# Patient Record
Sex: Female | Born: 1985 | Race: White | Hispanic: No | Marital: Single | Smoking: Never smoker
Health system: Southern US, Community
[De-identification: ages and names within clinical notes are randomized; demographics above are authoritative.]

## PROBLEM LIST (undated history)

## (undated) DIAGNOSIS — K567 Ileus, unspecified: Secondary | ICD-10-CM

## (undated) DIAGNOSIS — K2100 Gastro-esophageal reflux disease with esophagitis, without bleeding: Secondary | ICD-10-CM

## (undated) DIAGNOSIS — G809 Cerebral palsy, unspecified: Secondary | ICD-10-CM

## (undated) DIAGNOSIS — M419 Scoliosis, unspecified: Secondary | ICD-10-CM

## (undated) DIAGNOSIS — T7840XA Allergy, unspecified, initial encounter: Secondary | ICD-10-CM

## (undated) DIAGNOSIS — F72 Severe intellectual disabilities: Secondary | ICD-10-CM

## (undated) DIAGNOSIS — R569 Unspecified convulsions: Secondary | ICD-10-CM

## (undated) DIAGNOSIS — G40909 Epilepsy, unspecified, not intractable, without status epilepticus: Secondary | ICD-10-CM

## (undated) HISTORY — DX: Severe intellectual disabilities: F72

## (undated) HISTORY — PX: OTHER SURGICAL HISTORY: SHX169

## (undated) HISTORY — PX: GASTROSTOMY W/ FEEDING TUBE: SUR642

## (undated) HISTORY — DX: Allergy, unspecified, initial encounter: T78.40XA

## (undated) HISTORY — DX: Unspecified convulsions: R56.9

## (undated) HISTORY — DX: Scoliosis, unspecified: M41.9

---

## 2004-03-30 ENCOUNTER — Ambulatory Visit (HOSPITAL_COMMUNITY): Admission: RE | Admit: 2004-03-30 | Discharge: 2004-03-30 | Payer: Self-pay | Admitting: Family Medicine

## 2005-02-06 ENCOUNTER — Ambulatory Visit: Payer: Self-pay | Admitting: Internal Medicine

## 2005-02-06 ENCOUNTER — Inpatient Hospital Stay (HOSPITAL_COMMUNITY): Admission: EM | Admit: 2005-02-06 | Discharge: 2005-02-09 | Payer: Self-pay | Admitting: Emergency Medicine

## 2006-03-17 ENCOUNTER — Emergency Department (HOSPITAL_COMMUNITY): Admission: EM | Admit: 2006-03-17 | Discharge: 2006-03-17 | Payer: Self-pay | Admitting: Emergency Medicine

## 2006-04-05 ENCOUNTER — Inpatient Hospital Stay (HOSPITAL_COMMUNITY): Admission: EM | Admit: 2006-04-05 | Discharge: 2006-04-10 | Payer: Self-pay | Admitting: Emergency Medicine

## 2006-06-14 ENCOUNTER — Inpatient Hospital Stay (HOSPITAL_COMMUNITY): Admission: EM | Admit: 2006-06-14 | Discharge: 2006-06-27 | Payer: Self-pay | Admitting: Emergency Medicine

## 2006-06-14 ENCOUNTER — Ambulatory Visit: Payer: Self-pay | Admitting: Internal Medicine

## 2006-06-20 ENCOUNTER — Ambulatory Visit: Payer: Self-pay | Admitting: Internal Medicine

## 2006-06-21 ENCOUNTER — Ambulatory Visit: Payer: Self-pay | Admitting: Emergency Medicine

## 2006-06-26 ENCOUNTER — Encounter: Payer: Self-pay | Admitting: Internal Medicine

## 2006-10-01 ENCOUNTER — Inpatient Hospital Stay (HOSPITAL_COMMUNITY): Admission: EM | Admit: 2006-10-01 | Discharge: 2006-10-11 | Payer: Self-pay | Admitting: Emergency Medicine

## 2006-10-03 ENCOUNTER — Ambulatory Visit: Payer: Self-pay | Admitting: Oncology

## 2008-03-04 ENCOUNTER — Emergency Department (HOSPITAL_COMMUNITY): Admission: EM | Admit: 2008-03-04 | Discharge: 2008-03-04 | Payer: Self-pay | Admitting: Emergency Medicine

## 2008-03-07 ENCOUNTER — Inpatient Hospital Stay (HOSPITAL_COMMUNITY): Admission: EM | Admit: 2008-03-07 | Discharge: 2008-03-15 | Payer: Self-pay | Admitting: Emergency Medicine

## 2008-08-26 ENCOUNTER — Emergency Department (HOSPITAL_COMMUNITY): Admission: EM | Admit: 2008-08-26 | Discharge: 2008-08-26 | Payer: Self-pay | Admitting: Emergency Medicine

## 2010-04-24 LAB — URINALYSIS, ROUTINE W REFLEX MICROSCOPIC
Nitrite: NEGATIVE
Specific Gravity, Urine: 1.01 (ref 1.005–1.030)
pH: 8.5 — ABNORMAL HIGH (ref 5.0–8.0)

## 2010-04-24 LAB — CBC
HCT: 36 % (ref 36.0–46.0)
Hemoglobin: 12.3 g/dL (ref 12.0–15.0)
WBC: 7.3 10*3/uL (ref 4.0–10.5)

## 2010-04-24 LAB — DIFFERENTIAL
Eosinophils Relative: 2 % (ref 0–5)
Lymphocytes Relative: 39 % (ref 12–46)
Lymphs Abs: 2.8 10*3/uL (ref 0.7–4.0)
Monocytes Absolute: 0.5 10*3/uL (ref 0.1–1.0)
Neutro Abs: 3.7 10*3/uL (ref 1.7–7.7)

## 2010-04-24 LAB — BASIC METABOLIC PANEL
Potassium: 3.6 mEq/L (ref 3.5–5.1)
Sodium: 132 mEq/L — ABNORMAL LOW (ref 135–145)

## 2010-05-04 LAB — COMPREHENSIVE METABOLIC PANEL
ALT: 12 U/L (ref 0–35)
Albumin: 2.6 g/dL — ABNORMAL LOW (ref 3.5–5.2)
Alkaline Phosphatase: 39 U/L (ref 39–117)
Calcium: 7.7 mg/dL — ABNORMAL LOW (ref 8.4–10.5)
Glucose, Bld: 122 mg/dL — ABNORMAL HIGH (ref 70–99)
Potassium: 3 mEq/L — ABNORMAL LOW (ref 3.5–5.1)
Sodium: 127 mEq/L — ABNORMAL LOW (ref 135–145)
Total Protein: 5.7 g/dL — ABNORMAL LOW (ref 6.0–8.3)

## 2010-05-04 LAB — BASIC METABOLIC PANEL
BUN: 1 mg/dL — ABNORMAL LOW (ref 6–23)
BUN: 4 mg/dL — ABNORMAL LOW (ref 6–23)
BUN: 6 mg/dL (ref 6–23)
CO2: 26 mEq/L (ref 19–32)
CO2: 27 mEq/L (ref 19–32)
CO2: 31 mEq/L (ref 19–32)
Calcium: 7.5 mg/dL — ABNORMAL LOW (ref 8.4–10.5)
Calcium: 8.1 mg/dL — ABNORMAL LOW (ref 8.4–10.5)
Calcium: 8.4 mg/dL (ref 8.4–10.5)
Calcium: 8.7 mg/dL (ref 8.4–10.5)
Chloride: 100 mEq/L (ref 96–112)
Chloride: 90 mEq/L — ABNORMAL LOW (ref 96–112)
Chloride: 92 mEq/L — ABNORMAL LOW (ref 96–112)
Creatinine, Ser: 0.29 mg/dL — ABNORMAL LOW (ref 0.4–1.2)
Creatinine, Ser: 0.31 mg/dL — ABNORMAL LOW (ref 0.4–1.2)
Creatinine, Ser: 0.31 mg/dL — ABNORMAL LOW (ref 0.4–1.2)
Creatinine, Ser: <0.3 mg/dL — ABNORMAL LOW (ref 0.4–1.2)
GFR calc Af Amer: >60 mL/min (ref >60)
GFR calc Af Amer: >60 mL/min (ref >60)
GFR calc Af Amer: >60 mL/min (ref >60)
GFR calc Af Amer: >60 mL/min (ref >60)
GFR calc non Af Amer: >60 mL/min (ref >60)
Glucose, Bld: 105 mg/dL — ABNORMAL HIGH (ref 70–99)
Glucose, Bld: 115 mg/dL — ABNORMAL HIGH (ref 70–99)
Potassium: 3.5 mEq/L (ref 3.5–5.1)
Potassium: 4.1 mEq/L (ref 3.5–5.1)
Potassium: 4.3 mEq/L (ref 3.5–5.1)
Sodium: 133 mEq/L — ABNORMAL LOW (ref 135–145)
Sodium: 136 mEq/L (ref 135–145)

## 2010-05-04 LAB — URINE CULTURE
Colony Count: NO GROWTH
Culture: NO GROWTH

## 2010-05-04 LAB — DIFFERENTIAL
Basophils Absolute: 0 10*3/uL (ref 0.0–0.1)
Basophils Absolute: 0 10*3/uL (ref 0.0–0.1)
Basophils Absolute: 0 10*3/uL (ref 0.0–0.1)
Basophils Absolute: 0 10*3/uL (ref 0.0–0.1)
Basophils Absolute: 0 10*3/uL (ref 0.0–0.1)
Basophils Relative: 0 % (ref 0–1)
Basophils Relative: 0 % (ref 0–1)
Basophils Relative: 0 % (ref 0–1)
Basophils Relative: 0 % (ref 0–1)
Basophils Relative: 0 % (ref 0–1)
Eosinophils Absolute: 0 10*3/uL (ref 0.0–0.7)
Eosinophils Absolute: 0 10*3/uL (ref 0.0–0.7)
Eosinophils Absolute: 0 10*3/uL (ref 0.0–0.7)
Eosinophils Relative: 0 % (ref 0–5)
Eosinophils Relative: 0 % (ref 0–5)
Eosinophils Relative: 0 % (ref 0–5)
Eosinophils Relative: 1 % (ref 0–5)
Lymphocytes Relative: 21 % (ref 12–46)
Lymphocytes Relative: 60 % — ABNORMAL HIGH (ref 12–46)
Lymphs Abs: 0.9 10*3/uL (ref 0.7–4.0)
Monocytes Absolute: 0.4 10*3/uL (ref 0.1–1.0)
Monocytes Absolute: 0.9 10*3/uL (ref 0.1–1.0)
Monocytes Relative: 10 % (ref 3–12)
Monocytes Relative: 8 % (ref 3–12)
Neutro Abs: 1.2 10*3/uL — ABNORMAL LOW (ref 1.7–7.7)
Neutro Abs: 2.7 10*3/uL (ref 1.7–7.7)
Neutro Abs: 3 10*3/uL (ref 1.7–7.7)
Neutro Abs: 3.8 10*3/uL (ref 1.7–7.7)
Neutro Abs: 5.1 10*3/uL (ref 1.7–7.7)
Neutrophils Relative %: 53 % (ref 43–77)
Neutrophils Relative %: 64 % (ref 43–77)
Neutrophils Relative %: 74 % (ref 43–77)

## 2010-05-04 LAB — CULTURE, BLOOD (ROUTINE X 2)

## 2010-05-04 LAB — CROSSMATCH: Antibody Screen: NEGATIVE

## 2010-05-04 LAB — URINALYSIS, ROUTINE W REFLEX MICROSCOPIC
Glucose, UA: NEGATIVE mg/dL
Hgb urine dipstick: NEGATIVE
Specific Gravity, Urine: <1.005 — ABNORMAL LOW (ref 1.005–1.030)
pH: 6.5 (ref 5.0–8.0)

## 2010-05-04 LAB — CBC
HCT: 24.5 % — ABNORMAL LOW (ref 36.0–46.0)
HCT: 33.8 % — ABNORMAL LOW (ref 36.0–46.0)
HCT: 35 % — ABNORMAL LOW (ref 36.0–46.0)
Hemoglobin: 10.1 g/dL — ABNORMAL LOW (ref 12.0–15.0)
Hemoglobin: 12.1 g/dL (ref 12.0–15.0)
Hemoglobin: 8.3 g/dL — ABNORMAL LOW (ref 12.0–15.0)
MCHC: 33.3 g/dL (ref 30.0–36.0)
MCHC: 33.7 g/dL (ref 30.0–36.0)
MCHC: 34.6 g/dL (ref 30.0–36.0)
MCHC: 34.7 g/dL (ref 30.0–36.0)
MCV: 81.7 fL (ref 78.0–100.0)
MCV: 82.1 fL (ref 78.0–100.0)
Platelets: 189 10*3/uL (ref 150–400)
Platelets: 84 10*3/uL — ABNORMAL LOW (ref 150–400)
Platelets: 94 10*3/uL — ABNORMAL LOW (ref 150–400)
RBC: 2.97 MIL/uL — ABNORMAL LOW (ref 3.87–5.11)
RBC: 4.14 MIL/uL (ref 3.87–5.11)
RBC: 4.18 MIL/uL (ref 3.87–5.11)
RDW: 13 % (ref 11.5–15.5)
RDW: 13.1 % (ref 11.5–15.5)
RDW: 13.2 % (ref 11.5–15.5)
RDW: 13.3 % (ref 11.5–15.5)
WBC: 4.1 10*3/uL (ref 4.0–10.5)

## 2010-05-04 LAB — CULTURE, RESPIRATORY W GRAM STAIN

## 2010-06-01 NOTE — Group Therapy Note (Signed)
NAMEANNELYSE, REY                 ACCOUNT NO.:  [id number]   MEDICAL RECORD NO.:  [id number]          PATIENT TYPE:  INP   LOCATION:  IC07                          FACILITY:  [facility]   PHYSICIAN:  Edward L. Juanetta Gosling, M.D.DATE OF BIRTH:  Oct 10, 1985   DATE OF PROCEDURE:  DATE OF DISCHARGE:                                 PROGRESS NOTE   The patient's doctor is Dr. [name].   Ms. Cagley seems to be doing better this morning.  She is awake and alert,  as alert as she gets anyway.  Pulse is in the 80's, blood pressure is  about 110.  Her chest is a little clearer.  Her heart is regular.  Her  hemoglobin level looks better today.  Thus far her hemolytic workup  looks negative by my interpretation.  As mentioned, her hemoglobin level  seems to be better.  Chest x-ray yesterday perhaps showed a pneumonia,  although clinically does not appear to be the typical situation.  I am  going to get another x-ray today, and she is set for MR of the head  today.   My plan then would be to see what we can do with weaning if she is okay.  I think we can put her on a trach collar today.  She did very well  yesterday on the CPAP plus with her having the tracheostomy it is easy  to put her back on if we need to.      Edward L. Juanetta Gosling, M.D.  Electronically Signed     ELH/MEDQ  D:  10/04/2006  T:  10/04/2006  Job:  [id number]

## 2010-06-01 NOTE — Group Therapy Note (Signed)
[name], [name]                 ACCOUNT NO.:  [id number]   MEDICAL RECORD NO.:  [id number]          PATIENT TYPE:  INP   LOCATION:  IC07                          FACILITY:  [facility]   PHYSICIAN:  Edward L. Juanetta Gosling, M.D.DATE OF BIRTH:  01/28/1985   DATE OF PROCEDURE:  10/09/2006  DATE OF DISCHARGE:                                 PROGRESS NOTE   [name] continues about the same.  She remains with tracheostomy, using  a trach collar that now has been in place for about 96 hours and seems  to be doing okay.  She still has some secretions, but they are not as  bad.  She is growing Pseudomonas in her sputum of course.  Her other  medical problems are unchanged.  She remains relatively hypotensive, and  she has evidence of significant congestion in her chest.  She has  significant mental retardation and cerebral palsy.  Her eyes are open.  She has been alert.   Lab work:  BMP essentially normal.  CBC shows white count 7300.  Hemoglobin is 10.5.  Retic count 3.7.   ASSESSMENT:  She has respiratory failure, multifactorial, possibly a  pneumonia.  She is growing Pseudomonas in her sputum.  It has been  difficult to assess as to whether she actually has pneumonia or has had  atelectasis.  Because of the fact that we cannot ventilate her with high  volumes because of her uncuffed trach tube.  That of course could be  changed out.  But she tolerated it well.  She has anemia which is of  unknown cause.  She has had an episode of Pseudomonas in her sputum.  Otherwise, she seems fairly stable.  Her chronic medical problems are  unchanged.  She seems to be doing fairly well.  Plan is per Dr. Lubertha South, who apparently plans to potentially let her go home with her  home health fairly soon.      Edward L. Juanetta Gosling, M.D.  Electronically Signed     ELH/MEDQ  D:  10/10/2006  T:  10/10/2006  Job:  [id number]

## 2010-06-01 NOTE — Group Therapy Note (Signed)
NAMELUTRICIA, WIDJAJA                 ACCOUNT NO.:  [id number]   MEDICAL RECORD NO.:  [id number]          PATIENT TYPE:  INP   LOCATION:  IC07                          FACILITY:  [facility]   PHYSICIAN:  Edward L. Juanetta Gosling, M.D.DATE OF BIRTH:  Aug 15, 1985   DATE OF PROCEDURE:  DATE OF DISCHARGE:                                 PROGRESS NOTE   Ms. Vasudevan seems to be very stable.  She has been on the trache collar for  about 24 hours now and has tolerated that well.  Her secretions are much  thinner and overall she looks better.   EXAM:  Shows that she opens her eyes.  Her chest shows minimal if any rhonchi.  Her blood pressures in the high 80s, which is typical for her.  Her  heart rate is in the 80s.   Her lab work shows that her white count 6700, hemoglobin 9.7 about the  same.  Platelets 83,000 reticulocyte count 2.4 BMET normal, glucose is  176.   ASSESSMENT:  She is much improved.   PLAN:  She is going to continue on her antibiotics for the Pseudomonas  in her sputum, but overall she seems much better.  I will be gone after  this morning until the morning of September 22.  If she is still in the  hospital I will see her then.      Edward L. Juanetta Gosling, M.D.  Electronically Signed     ELH/MEDQ  D:  10/06/2006  T:  10/06/2006  Job:  [id number]

## 2010-06-01 NOTE — Group Therapy Note (Signed)
[name], [name]                 ACCOUNT NO.:  [id number]   MEDICAL RECORD NO.:  [id number]          PATIENT TYPE:  INP   LOCATION:  IC07                          FACILITY:  [facility]   PHYSICIAN:  Edward L. Juanetta Gosling, M.D.DATE OF BIRTH:  [birth year]   DATE OF PROCEDURE:  10/10/2006  DATE OF DISCHARGE:                                 PROGRESS NOTE   [name] is overall about the same.  She has no new problems noted  through the night.  Her white count is 10,300.  Hemoglobin is 10.6.  Platelets 226.  Retic 2.8.  BMP normal.   ASSESSMENT:  She has had a remarkable improvement in her platelet count  and anemia, etc.  Her chest is clear now.  She still needs treatment for  Pseudomonas.  We will plan to sign off at this point.   Thank you for allowing me to see her with you.  I will be glad to see  her at any point at your request.      Ramon Dredge L. Juanetta Gosling, M.D.  Electronically Signed     ELH/MEDQ  D:  10/10/2006  T:  10/10/2006  Job:  [id number]

## 2010-06-01 NOTE — H&P (Signed)
[name], [name]                 ACCOUNT NO.:  [id number]   MEDICAL RECORD NO.:  [id number]          PATIENT TYPE:  INP   LOCATION:  IC06                          FACILITY:  [facility]   PHYSICIAN:  Scott A. Gerda Diss, MD    DATE OF BIRTH:  11-19-1985   DATE OF ADMISSION:  06/14/2006  DATE OF DISCHARGE:  LH                              HISTORY & PHYSICAL   CHIEF COMPLAINT:  Vomiting, fever.   HISTORY OF PRESENT ILLNESS:  This is a 25 year old female with history  of severe cerebral palsy, scoliosis, seizure disorder, who presents with  a 1 day history of fever, multiple episodes of vomiting last night and  early this morning.  No diarrhea, has been running some fever.  Does not  appear to be in any pain.  Family has not noted any type of respiratory  difficulty.   PAST MEDICAL HISTORY:  1. Cerebral palsy since birth.  2. Scoliosis.  3. Reflux.  4. Has a G-tube for feedings.  5. Tracheostomy with a tracheotomy tube.  6. Seizure disorder.  7. Dyspnea with a history of sleep apnea.  8. Contractions and spasms.  9. History of severe mental retardation.  10.Chronic allergies.  11.History of pneumonia.  12.History of slight aspirations.  13.History interstitial nephritis.  14.History of chronic constipation.   PAST SURGICAL HISTORY:  1. Tracheostomy in 2000 and G-tube in 2000.  2. Multiple hospitalizations for pneumonia.   FAMILY HISTORY:  Hypertension, diabetes.   SOCIAL HISTORY:  Single, does not smoke or drink, is bedridden.   ALLERGIES:  FORTAZ GIVES HIVES, ALSO REPORTED MORPHINE ALLERGY.   MEDICATIONS:  1. Depakote 250 mg/5 mL, 15 mL q.h.s. and 10 mL twice daily.  2. Tegretol 100 mg tabs 2 per PEG b.i.d., 3 q.h.s. per PEG.  3. Prilosec 20 mg daily.  4. Baclofen 20 mg per 4 mL (5 mg per mL) take 4 mL per PEG q.i.d.  5. GlycoLax 1/2 scoop daily in water and then feedings per PEG.   It should be noted that the patient was recently in the hospital with a  partial small bowel  obstruction, was observed for a couple of days and  went away on its own.  CT scan was done, and no surgical etiology was  found for this.   REVIEW OF SYSTEMS:  See for above.   LABORATORY DATA:  White count 14.2, hemoglobin 15.3, neutrophils 90%,  lymphocytes 5%, sodium 135, potassium 2.6, chloride 92, bicarb 30,  glucose 129, BUN 27, creatinine 0.64.  Liver enzymes normal.  Urine  shows greater than 1.030 with moderate bilirubin, total protein 100,  nitrites positive, 7-10 WBC, many bacteria.  Urine culture a.m. and  blood culture was sent.   PHYSICAL EXAMINATION:  LUNGS:  Clear.  HEART:  Regular.  Pulse normal.  It should be noted that blood pressure  looks good.  VITAL SIGNS:  Temperature 100.0 rectally.  ABDOMEN:  Soft, no guarding or rebound.  It should also be noted that  the x-ray shows partial small bowel obstruction.  LUNGS:  Look good.   ASSESSMENT/PLAN:  1. Febrile illness - I feel this is secondary to urinary tract      infection, possibly pyelonephritis.  Treatment:  Cipro IV.  Wait      culture of the urine and culture of blood work.  2. Partial small bowel obstruction, consult surgery for consultation,      but it went away on its on last time.  Hopefully, it will do the      same this time.  3. Seizure disorder.  Go with the medications but cannot feed per PEG      just yet.  We will also check Tegretol and Depakote levels and      monitor the patient accordingly.      Scott A. Gerda Diss, MD  Electronically Signed     SAL/MEDQ  D:  06/14/2006  T:  06/14/2006  Job:  [id number]

## 2010-06-01 NOTE — Consult Note (Signed)
NAMEMARICE, [name]                 ACCOUNT NO.:  [id number]   MEDICAL RECORD NO.:  [id number]          PATIENT TYPE:  INP   LOCATION:  IC07                          FACILITY:  [facility]   PHYSICIAN:  Edward L. Juanetta Gosling, M.D.DATE OF BIRTH:  Aug 29, 1985   DATE OF CONSULTATION:  DATE OF DISCHARGE:                                 CONSULTATION   REASON FOR CONSULTATION:  The patient is on respirator.   HISTORY:  [name] is a 25 year old who has multiple medical problems  including cerebral palsy, seizure disorder, tracheostomy which is  permanent, trach tube which is permanent and mental retardation.  She  lives at home with her mother and has been brought to the emergency room  because of fever and seizures.  She has had two seizures.  She was  febrile.  She was brought to the emergency room and she was also  coughing up some thick sputum as well.   PAST MEDICAL HISTORY:  As mentioned is positive for cerebral palsy,  seizure disorder, mental retardation, bowel obstruction, chronic  difficulty with secretions requiring permanent tracheostomy, PEG tube  placement.   SOCIAL HISTORY:  She lives at home with her mother.  Has a permanent  trach and PEG tube and is unable to provide any of her own care.   REVIEW OF SYSTEMS:  Is essentially negative, otherwise no other history.   MEDICATIONS:  Are supposed to be Depacon 750 mg in the morning, 1000 mg  at 2:00 p.m. and 8 p.m., Tegretol 100 mg two tablets twice a day, three  tablets at bedtime, Reglan 5 mg four times a day, Zyrtec 10 mg daily,  baclofen 20 mg four times day, Prilosec 20 mg b.i.d., Robinul 1 mg 3  times a day, GlycoLax daily, simethicone as-needed, Ativan 1 mg p.r.n.   EXAM:  Shows that she is in the bed essentially unresponsive with the  tracheostomy and she is on the ventilator, but this is a non-cuffed  tracheostomy tube.  So it is difficult to say, to the full ventilation  effect she is getting.  Her temperature was 99.9 on  admission.  Blood  pressure in the 110 range, pulse about 80.  She had been in the 160s and  170s.  Her nose and throat are clear.  She does have some reaction to  light in her pupils.  She has what appears to be some thrush __________  she does have a tracheostomy.  She does __________ .  Her chest shows  some rhonchi bilaterally.  Her heart is regular without gallop.  Her  abdomen bowel sounds are present.  Peg tube is in place.  She has  contractures in her upper and lower extremities.  Noncommunicative.  On  admission white blood cell count 13,000, hemoglobin 13, hematocrit of  40, platelets 103, blood gas on 6 liters pH 7.4, pCO2 of 41, pO2 of 66.  Sodium 141, potassium 2.9, chloride 98, bicarb 30, glucose 84, BUN 17,  creatinine 0.4.  Her urine shows nitrite negative, but 7-10 white cells,  0-2 red cells and some bacteria.  This morning, magnesium 2.5,  phosphorus 5.1, slightly high.  Blood gas on 100% tidal volume of 250  assist control rate of 20, pH 7.38, pCO2 of 37.9, pO2 of 297.  Blood  culture so far is negative.  X-ray shows right basilar atelectasis.  No  definite pneumonia.   ASSESSMENT:  She has increased from seizures.  She is having increased  respiratory difficulty and has tachycardia.  Her routine urine was  pretty negative but she did have white cells and bacteria seen in the  cath  specimen so she may well have a urinary tract infection.  If she had  more seizures she certainly would be at risk for aspiration cause her  tube is non-cuffed.  But I do not see any definite evidence that she has  that.  Respiratory culture is pending.  Blood cultures negative so far.  She is on 100% O2.  Her pO2 is 297 so we can certainly drop her FIO2.      Edward L. Juanetta Gosling, M.D.  Electronically Signed     ELH/MEDQ  D:  10/02/2006  T:  10/02/2006  Job:  [id number]

## 2010-06-01 NOTE — Group Therapy Note (Signed)
[name], [name]                 ACCOUNT NO.:  [id number]   MEDICAL RECORD NO.:  [id number]           PATIENT TYPE:  INP   LOCATION:  IC06                          FACILITY:  [facility]   PHYSICIAN:  [name], M.D.DATE OF BIRTH:  June 26, 1985   DATE OF PROCEDURE:  06/16/2006  DATE OF DISCHARGE:                                 PROGRESS NOTE   SUBJECTIVE:  The patient has had no further vomiting.  Her bowels are  moving.  Mom is concerned her urine output might not be quite as good.  No excessive fussiness noted.   PHYSICAL EXAMINATION:  VITAL SIGNS:  Stable, afebrile.  BP 110/70, O2  saturation is good.  LUNGS:  Baseline junkiness.  No reactive airway.  No tachypnea.  HEART:  Normal rate.  No significant murmurs.  ABDOMEN:  Soft, good bowel sounds.   Urine culture returned no growth so far.  Tracheal culture pending.  See  CBC and MET-7 results.   IMPRESSION:  1. Partial small bowel obstruction, clinically improving, though not      back to baseline yet.  2. IV access difficulties.  A 24-gauge peripheral IV restarted today,      failed.  We are going to go ahead and place a PICC line because the      patient may need to be here for several more days.  Discussed with      family.  Advised also with uncertain urine output, we will press on      with the Foley.  3. Tracheitis.  4. Question urinary tract infection.  Culture is negative, not sure if      this was done after the first round of antibiotics.  With the      tracheitis, we will go ahead and maintain Cipro.  Awaiting      peritracheal cultures.  5. Seizure disorder.  6. Feeding concerns.  Will maintain same feeding for now.   PLAN:  As per orders and as per Dr. Lovell Sheehan.  Expect discharge 48-72  hours.  May move from ICU to regular room tomorrow.  Further orders as  noted on the chart.      [name], M.D.  Electronically Signed     WSL/MEDQ  D:  06/16/2006  T:  06/17/2006  Job:  [id number]

## 2010-06-01 NOTE — Discharge Summary (Signed)
NAMEADAN, BEAL                 ACCOUNT NO.:  [id number]   MEDICAL RECORD NO.:  [id number]          PATIENT TYPE:  INP   LOCATION:  2107                         FACILITY:  [facility]   PHYSICIAN:  Zenia Resides, NP      DATE OF BIRTH:  1985/07/22   DATE OF ADMISSION:  06/21/2006  DATE OF DISCHARGE:                               DISCHARGE SUMMARY   DISCHARGE DIAGNOSES:  1. Status post acute respiratory failure/ventilator dependent      respiratory failure secondary to left lower lobe pneumonia versus      Pseudomonas tracheal bronchitis, and altered mental status.  2. Seizures.  3. History of cerebral palsy with significant debilitation.   BRIEF HISTORY:  The patient is a 25 year old female with a history of  severe cerebral palsy, scoliosis, seizure disorder who presented to  [facility] with a 1-day history of fever and multiple episodes  of vomiting. She was diagnosed with a partial small bowel obstruction as  well as Pseudomonal tracheal bronchitis. She was treated conservatively  for ileus, started on broad-spectrum antibiotics, and then later had  antibiotic coverage narrowed based on sensitivities. She developed  progressive hypotension on 06/17/06, as well as acute respiratory  distress felt to be secondary to mucous plugging. A chest x-ray  demonstrated left lower lobe atelectasis versus infiltrate in the  setting of known Pseudomonas sputum cultures.   Apparently there was some difficulty in changing the patient over from a  cuffless to a cuffed trach. At [facility] she was actually  ventilated on a cuffless trach during transfer. She came to [facility]. On full ventilator support she was changed to a cuffed trach.  Antibiotic coverage was continued to complete 10 days of complete  antibiotic therapy. She was on gentamicin from 5/131/08 to 06/22/06, and  then continued Aztreonam from 06/22/06 to 06/27/06. Upon the time of  discharge her sputum is clear  but continues to be copious at times. She  has no new fever, chills and chest x-ray is cleared. She has been  liberated from mechanical ventilation and upon time of discharge is back  to maintenance supplemental oxygen support.   Ileus diagnosed at [facility], resolved. Will be discharged to  home back on home medication regimen.   Seizure disorder. She did have a witnessed seizure while at [facility] which resulted in the patient's  being placed on Dilantin. Dr.  Jolayne Panther' office was notified. This is the pediatric neurologist who  had followed the patient for quite some time prior to today. She has not  seen an adult neurologist yet at this point, but recommendations were  made to increase her Depakote dosing as well as her Tegretol dosing.  Upon time of discharge she will be changed to 1000 mg 3 times a day of  Depakote and her dosing of Tegretol will be changed from 200 mg twice a  day and 300 mg once a day to 200 mg once a day and 300 mg twice a day.   Cerebral palsy and severe debilitation. No  acute changes have been noted  during this hospitalization. These issues are stable and chronic. She  will be discharged to home where her mother has been instructed to  resume all previous home health care instructions including humidified  oxygen via trach collar with 2 liters blood in at night. Routine  tracheostomy care as indicated. Fiber __________ as previously noted and  complete home health care nursing.   LABORATORY DATA:  Upon time of discharge on 06/27/06: Sodium 136,  potassium 3.9, chloride 103, bicarbonate 28, glucose 110, BUN 5,  creatinine less than 0.3. Hemoglobin 9.7, hematocrit 28.8, white blood  cell count 9.4, platelet count 245.   Microbiology sputum culture demonstrating moderate Pseudomonas  aeruginosa obtained on 06/15/06, pan sensitive.   On 06/17/06, blood cultures x2, no growth to date.   Drug levels: Valproic acid currently 46.1,  Tegretol at 7.2.   Radiology: Chest x-ray demonstrating improved left lower lobe  atelectasis.   PHYSICAL EXAMINATION:  Upon time of discharge physical exam revealed a T  max of 99.4, heart rate 107 to 113, blood pressure of 102/85,  respirations 26, saturations 97% on 40% trach collar. In general, a  debilitated, contracted, young, small female with slight tachypnea.  HEENT, she has mild yellow-tinged secretions from tracheostomy site.  Lungs demonstrating low volumes. No wheeze, decreased at bases. Cardiac,  regular rate and rhythm and abdomen somewhat distended. Tolerating tube  feeds currently. Extremities are without edema. Neurologically moving  spontaneously but not following commands.   DISPOSITION:  Currently cleared from a critical care standpoint to  resume previous total care as outlined by her prior primary care  Nicolet Griffy under the direction of the patient's  mother and home health  nursing staff from [place].   DISCHARGE INSTRUCTIONS:  As follows  1. Diet. She is to resume Ensure Plus 8 ounces every 6 hours, followed      by 120 mL of water flush. Additionally, she was instructed to flush      feeding tube after medications.  2. Followup. She has follow up with a resident neurology clinic at      [facility] on July 28th. Also follow up with Dr. Lubertha South 07/06/06,      at 2:20.  3. Discharge medications      a.     Depakote 1000 mg three times a day.      b.     Tegretol 200 mg in the morning, followed by 300 mg x2 more       doses on a daily basis.      c.     Prilosec 20 mg daily.      d.     Baclofen 20 mg 4 times a day.      e.     GlycoLax half a scoop daily with water.  4. Activity. The mother was instructed to resume all previous activity      including prior tracheostomy care, prior fiber __________ , prior      pulse oximetry, prior activity as outlined by her previous      providers.   Upon time of discharge the patient appears to have met maximum  benefit from inpatient stay. She will be discharged to home under the care of  her mother and home health nursing staff will follow up as previously  outlined.      Zenia Resides, NP     PB/MEDQ  D:  06/27/2006  T:  06/27/2006  Job:  [id number]  cc:   Monia Sabal, Dr.  [name], M.D.

## 2010-06-01 NOTE — H&P (Signed)
NAMECARMELA, [name]                 ACCOUNT NO.:  [id number]   MEDICAL RECORD NO.:  [id number]          PATIENT TYPE:  INP   LOCATION:  IC07                          FACILITY:  [facility]   PHYSICIAN:  Marcello Moores, MD   DATE OF BIRTH:  May 30, 1985   DATE OF ADMISSION:  10/01/2006  DATE OF DISCHARGE:  LH                              HISTORY & PHYSICAL   PRIMARY MEDICAL DOCTOR:  Dr. Simone Curia.   NEUROLOGIST:  At [facility].   CHIEF COMPLAINT:  Two episodes of seizure and fever in the afternoon.   HISTORY OF PRESENT ILLNESS:  [name] is a 25 year old female patient  with multiple medical problems including cerebral palsy since childhood,  history of chronic seizure disorder, on medication, permanent  tracheostomy and permanent PEG placement and with mental retardation.  She lives with her mom and brought here today to the emergency room with  the above complaint.  The mother stated that she had 2 episodes of  seizure which lasted for several minutes and then she noticed that she  was febrile also in 99 degrees Fahrenheit and she decided to bring her  to the emergency room and she was coughing and with thick sputum as  well.  Otherwise, the mother stated that other than these episodes of  seizure, she was in her usual state and she did not notice any new  change, other than this episode of seizure and low-grade fever.   REVIEW OF SYSTEMS:  Ten-point review of system is very limited because  of the above-mentioned medical problems.  The patient is not  communicative and the only history extracted was from her mother, who is  her caregiver.   ALLERGIES:  1. CEFTAZIDIME.  2. FORTAZ.  3. MORPHINE.   SOCIAL HISTORY:  The patient is sick and dependent and lives with her  mother and she is on chronic permanent tracheostomy and permanent PEG  for feeding.   PAST MEDICAL HISTORY:  1. Cerebral palsy.  2. Seizure disorder.  3. Mental retardation.  4. History of bowel  obstruction.  5. Chronic respiratory failure, status post permanent tracheostomy.  6. Status post PEG placement.   HOME MEDICATIONS:  1. Depacon 750 mg per 15 mL every morning.  2. Depacon 1000 mg per 20 mL twice a day at 2 p.m. and 8 p.m.  3. Tegretol 100 mg two tablets twice a day.  4. Tegretol 100 mg three tablets at bedtime.  5. metoclopramide5 mg per 5 mL four times a day.  6. Zyrtec 10 mg per 10 mL once a day.  7. Baclofen 20 mg four times a day.  8. Prilosec 20 mg twice a day.  9. Robinul 1 mg per 2.5 mL three times a day.  10.GlycoLax one cup daily.  11.SSimethicone0 mg.  12.Diastat 5 mg p.r.n.  13.Ativan 1 mg p.r.n.   PHYSICAL EXAMINATION:  GENERAL:  The patient is lying on the bed on  tracheostomy and on oxygen supply.  VITAL SIGNS: Temperature is 99.9, blood pressure is 109/71 and pulse is  129, respiratory rate is 22-24 per  minute and saturation is currently  100% on 6 L of oxygen via the tracheostomy.  HEENT:  She has pink conjunctivae and anicteric sclerae.  Pupils are  equal and reactive slowly.  She has buccal mucosa scanty thrush.  NECK:  She is on tracheostomy.  CHEST:  She has air entry bilaterally with some scattered crackles on  both sides.  Otherwise, there are no wheezes or rhonchi.  CVS: S1 and S2 well-heard, tachycardiac.  ABDOMEN:  Flat.  PEG tube is in place and clear, well-functioning.  EXTREMITIES:  She has contractures on both upper and lower extremities,  but there is not any edema.  CNS:  She is noncommunicative, as mentioned above, and with complication  of cerebral palsy with contractures and difficult to appreciate whether  there is any neurological deficit or not.   LABORATORY DATA:  White blood cell count is 13,000, hemoglobin is 13 and  hematocrit is 40; platelet count is 103,000.  ABG was done on 6 L; pH  was 7.4, pCO2 was 41 and pO2 was 66, bicarb 29 and saturation was 92%.  On the chemistry, sodium is 141 and potassium is 2.9, chloride  is 98,  bicarb is 30, glucose is 84, BUN 17, creatinine is 0.4, calcium is  9.3.Carbamazepinene level is less than 2 and the valproic acid level is  175, above the therapeutic level.   ASSESSMENT:  1. Seizure episode.  The patient has seizure disorder for which she is      on Depakote and carbamazepine and she did not take the evening dose      of carbamazepine and the level is less than the therapeutic level      and will give her her bedtime dose.  She did not have any episode      of seizure since she is in the emergency room and will continue her      medications as before and will monitor for any episode of seizure      and will admit her to intensive care unit for close monitoring and      we will do MRI of the brain tomorrow morning and will consult a      neurologist also for further management.  The patient has an      appointment on Tuesday at [facility] with neurologist.  2. Aspiration pneumonia.  The patient presented with fever after the      seizure and she has episodes of cough with thick sputum and I will      put her on Levaquin and clindamycin intravenously, even though the      chest x-ray did not show anything new except an elevated right      hemidiaphragm and with atelectasis and old scar.  3. Hypokalemia and we will replace it to with intravenous potassium      chloride and will repeat the potassium level.  We will put her on      oxygen via the tracheostomy and to keep her saturation more than      94% and will      watch for seizure and aspiration and will continue her home      medication as it was and will put her on deep venous thrombosis as      well as GI prophylaxis and we will monitor her CBC and Chem-3 and      will send urinalysis as well as blood culture.  She will be  followed by Dr. Lubertha [name].      Marcello Moores, MD  Electronically Signed     MT/MEDQ  D:  10/02/2006  T:  10/02/2006  Job:  [id number]

## 2010-06-01 NOTE — Consult Note (Signed)
[name], [name]                 ACCOUNT NO.:  [id number]   MEDICAL RECORD NO.:  [id number]          PATIENT TYPE:  INP   LOCATION:  IC06                          FACILITY:  [facility]   PHYSICIAN:  R. Roetta Sessions, M.D. DATE OF BIRTH:  1985/11/09   DATE OF CONSULTATION:  DATE OF DISCHARGE:                                 CONSULTATION   REQUESTING PHYSICIAN:  Scott A. Gerda Diss, MD   REASON FOR CONSULTATION:  Hemoccult positive stool/ileus.   HISTORY OF PRESENT ILLNESS:  [name] is a 25 year old Caucasian female  with a complicated past medical history.  She has severe cerebral palsy,  scoliosis, seizure disorder, G-tube, and tracheotomy.  Her mother states  she has history of recurrent ileus. She was hospitalized from March 19  to March 24 of this year followed by Dr. Lovell Sheehan. She has history of  similar problems approximately 2 years ago and was seen at [facility].  A week ago she began vomiting, she  vomited 4 times that night and the next morning her mother brought her  to the emergency room.  She was diagnosed with a urinary tract infection  and partial small-bowel obstruction/ileus. She has had heavy menses  since she has been admitted.  There are no complaints of abdominal pain.  Her mom says she __________ 2 days ago, she is having 1 to 3 __________  or melena.  There has been no reported hematemesis.  Her hemoglobin  dropped to 8.4 on Jun 17, 2006.  She is status post 2 units transfusion  of packed red blood cells.  Hemoglobin is up to 11 today. She had a non  contrast CT on April 05, 2006 which showed a distal small bowel  obstruction and pneumoperitoneum.  She also has plain films June 19, 2006  which showed gaseous distention of both large and small bowel consistent  with ileus.  She has had 1 hemoccult positive stool yesterday by Dr.  Gerda Diss and 1 negative this morning.   PAST MEDICAL AND SURGERY HISTORY:  Severe cerebral palsy,  scoliosis,  seizure disorder, chronic GERD, G-tube was placed in 2000 by Dr. Alphonzo Grieve  at [facility]. She was tolerating the  feeding until recent admission.  She had tracheostomy in 2000, she has  history of dyspnea, sleep apnea, severe mental retardation, chronic  allergies,  history of recurrent aspiration pneumonia, interstitial  nephritis, chronic constipation, ileus as described in HPI.   MEDICATIONS PRIOR TO ADMISSION:  1. Depakote 250 mg per 5 ml at 15 ml every night and 10 ml b.i.d.  2. Tegretol 200 mg per PEG b.i.d. and 300 mg every night.  3. Prilosec 20 mg daily.  4. Baclofen 20 mg/4 ml, 4 mL q.i.d.  5. GlycoLax one-half scoop in water daily.   ALLERGIES:  FORTAZ, __________.   FAMILY HISTORY:  There is no family history of IBD or __________.  Her  father is deceased at 88 secondary to complications from diabetes  mellitus, renal failure and coronary disease.  Mother has history of  hypertension  and anemia.   SOCIAL HISTORY:  Ms. Ohalloran lives with her mother.  She has 2 healthy  older sisters.  There is no history of tobacco, alcohol or drug use.   REVIEW OF SYSTEMS:  __________ fever and chills.  __________ began 5  days ago.  She has history of nausea.  Otherwise negative review of  systems.   PHYSICAL EXAMINATION:  VITAL SIGNS:  Temperature 101.5, pulse 102.  Respirations 24.  Blood pressure 91/30.  GENERAL:  Pleasant, __________ Caucasian female who is alert.  She  responds to verbal, tactile stimulus with smiling.  Her mother is at her  bedside  Her sclerae are clear, nonicteric.  Conjunctivae pink.  Oropharynx is  pink and moist without any lesions.  She has a trachea intact with white  discharge at the site.  HEART: Regular rate and rhythm , normal S1, S2.  LUNGS:  Rhonchi.  ABDOMEN:  Has decreased bowel sounds x4.  Soft, non-tender,  nondistended.  Without hepatosplenomegaly or mass.  She has a Foley to  straight drainage, bag  with clear yellow urine.  EXTREMITIES:  Contractures bilaterally.   LABORATORY STUDIES:  WBC 8.1, hemoglobin 11, hematocrit 31.7, platelets  162.  Calcium 8.2, sodium 136, potassium 4, chloride 105, CO2 of 25, BUN  4, creatinine 0.3, glucose 110, total bilirubin 0.6, alkaline  phosphatase 46, AST 15, ALT 10, total protein 6.3 and albumin 2.9.   IMPRESSION:  [name] is an unfortunate 25 year old __________  severe cerebral palsy, ileus, seizure disorder, G-tube, and trach with  recurrent emesis and history of ileus.  On June 19, 2006, __________  persistent ileus, she was found to have hemoccult positive stool by Dr.  Gerda Diss, negative this morning.  She has required transfusion when her  hemoglobin dropped to 8.4. There is no evidence of active  gastrointestinal bleeding. She has received 2 units of packed RBC's and  hemoglobin is at 11. She is menstruating heavily, per mother.  She is on  dopamine for hypertension although she normally is somewhat hypertensive  at her baseline per Dr. Gerda Diss.   She is at risk for peptic ulcer disease, __________ obstruction and  ileus.  This case has been discussed with Dr. __________ and our plan of  care is outlined below.   PLAN:  1. EGD once patient off dopamine and stable enough.  2. Agree with proton pump inhibitor.  3. Transfusion p.r.n. to keep H and H stable.  4. Once her EGD is completed and vomiting has resolved would consider      contrast CT __________ evaluation of possible ileus.  5. Will follow closely.  We would like to thank Dr. Gerda Diss for the      opportunity to take care of Ms. Larita Fife.      Nicholas Lose, N.P.      Jonathon Bellows, M.D.  Electronically Signed    KC/MEDQ  D:  06/20/2006  T:  06/20/2006  Job:  [id number]   cc:   Lorin Picket A. Gerda Diss, MD  Fax: [phone]

## 2010-06-01 NOTE — Consult Note (Signed)
[name], [name]                 ACCOUNT NO.:  [id number]   MEDICAL RECORD NO.:  [id number]          PATIENT TYPE:  INP   LOCATION:  IC06                          FACILITY:  [facility]   PHYSICIAN:  Edward L. Juanetta Gosling, M.D.DATE OF BIRTH:  11/04/1985   DATE OF CONSULTATION:  DATE OF DISCHARGE:                                 CONSULTATION   HISTORY OF PRESENT ILLNESS:  Ms. Resch is a 25 year old with a history of  cerebral palsy, scoliosis, and seizure disorder.  She came into the  hospital with what appears to have been probably a pneumonia.   PAST MEDICAL HISTORY:  She has a history of cerebral palsy, scoliosis,  and reflux.  She has a G-tube.  She has a tracheostomy.  She has seizure  disorder, history of sleep apnea, history of multiple contractures,  mental retardation, chronic allergies, history of pneumonia,  interstitial nephritis, and chronic constipation.   PAST SURGICAL HISTORY:  She has had a trache in 2000, G-tube in 2000.   HOSPITALIZATIONS:  She had been hospitalized on multiple occasions for  pneumonia.   FAMILY HISTORY:  Positive for hypertension and diabetes.   SOCIAL HISTORY:  She is single.  She is bedridden.   MEDICATIONS AT HOME:  1. Depakote 250 mg/5 mL 15 mL at bedtime, 10 mL twice a day.  2. Tegretol 100 mg 2 per percutaneous endoscopic gastrostomy tube two      times a day, three at bedtime.  3. Prilosec 20 mg daily.  4. Baclofen 20 mg/4 mL 4 mL four times a day by PEG tube.  5. Glycolax.   REVIEW OF SYSTEMS:  Positive for bowel obstruction.   PHYSICAL EXAMINATION:  GENERAL:  Her eyes are open.  She is not  terrifically responsive.  VITAL SIGNS:  Blood pressure 101/40, pulse 102, respirations 25, oxygen  sats 95%, so she is being oxygenated fairly well, but she is losing  almost all of her ventilator breath because of the end-cuff tracheostomy  tube.  CHEST:  Some rhonchi bilaterally.  HEART:  Regular.  ABDOMEN:  Soft.  EXTREMITIES:  No edema.   IMAGING STUDIES:  Her x-ray of the abdomen done on June 2 is consistent  with ileus.  Chest x-ray from today:  Left lower lobe atelectasis,  consolidation; improved right basilar atelectasis; continued colonic  ileus.   LABORATORY DATA:  BMED today shows BUN 3, creatinine 0.37.  CBC done  yesterday:  White count 8100, hemoglobin 11.  Blood cultures from May  31:  Both with no growth.   ASSESSMENT:  She has developed respiratory failure.  She is now on a  ventilator.  She has ileus.  She has a seizure disorder, and we are not  able to ventilate her very well.  Her oxygenation appears to be okay.  I  will get a breath test and see what we can do to help with ventilation.  I have discussed her situation with Dr. Jearld Fenton of [facility],  and he feels that they cannot do a great deal to help.      Ramon Dredge  Patrice Paradise, M.D.  Electronically Signed     ELH/MEDQ  D:  06/21/2006  T:  06/21/2006  Job:  [id number]

## 2010-06-01 NOTE — H&P (Signed)
[name], [name]                 ACCOUNT NO.:  [id number]   MEDICAL RECORD NO.:  [id number]          PATIENT TYPE:  INP   LOCATION:  IC04                          FACILITY:  [facility]   PHYSICIAN:  [name], M.D.DATE OF BIRTH:  06-02-1985   DATE OF ADMISSION:  03/07/2008  DATE OF DISCHARGE:  LH                              HISTORY & PHYSICAL   CHIEF COMPLAINT:  Cough, fever, wheezing.   SUBJECTIVE:  This patient is a 25 year old Caucasian female with a  history of cerebral palsy since birth, reflux, chronic trache, seizure  disorder, chronic dyspnea, chronic contractions problem, interstitial  nephritis, chronic constipation, and history of prior pneumonia who  presents to emergency room on the day of admission.  The patient was  seen in the emergency room several days prior.  X-ray at that time  reveal left lower lobe pneumonia.  An attempt was made for outpatient  therapy.  Child was put on Rocephin injections along with Zithromax oral  via Zithromax via G-tube.  I was called earlier in the day of admission  by the [facility] Nurse, who stated that this young woman was having more  difficulties with rapid breathing and that she was requiring more  oxygen.  They stated they had put her on 3 L nasal cannula O2 then noted  ongoing greenish yellow secretions exuding from the trache and also  coming up with suction.   PRIOR SURGERIES:  Remote tracheostomy, remote G-tube.   PRIOR MEDICAL HISTORY:  Chronic problems as noted above along with  multiple hospitalizations for pneumonia.   FAMILY HISTORY:  Positive for hypertension, diabetes.   ALLERGIES:  FORTAZ caused rash and hives.   PRIOR MEDICAL HISTORY:  Also significant for chronic anemia.  This has  been worked up in the past felt  and felt to be most likely anemia of  chronic disease this involves low hemoglobin, lowest white blood count,  and platelet count.   Family states, the patient compliant with all chronic meds as  detailed  at great length in the medical reconciliation sheet.  1. Depakene 15 mL t.i.d.  2. Zyrtec 10 mL daily 1 mg/mL.  3. Baclofen suspension 5 mg/mL, 4 mL q.i.d.  4. Tegretol 100 mg 3 in the morning, 2 in the afternoon, Tegretol 100      mg 3 in the evening.  5. Prilosec 20 mg capsule b.i.d.  6. Robinul b.i.d. 2.5 tablets b.i.d.  7. Bactroban ointment p.r.n. and other PR meds.  8. Also Ativan 1 mg one-half nightly.   PHYSICAL EXAMINATION:  VITAL SIGNS:  Temperature 100.4, respiratory rate  24 breaths per minute, O2 sat 96% on 2 L.  GENERAL:  This patient is tachypneic, somewhat wheezy, and significantly  congested.  HEENT:  Mucous membranes dry.  LUNGS:  Bilateral wheezes.  Some inspiratory crackles.  Minimal  accessory muscle use.  HEART:  Tachycardiac.  ABDOMEN:  Soft.  No discrete masses.  SKIN:  Stage I pressure ulcer on the foot.  Pulses good.  Contractures  noted.  NEURO:  Baseline.   CBC and MET-7, potassium  lowest, white blood count normal.  Chest x-ray,  persistent left lower lobe pneumonia.   IMPRESSION:  1. Pneumonia.  2. Exacerbation of reactive airways.  3. Chronic trache and chronic secretions secondary to cerebral palsy.  4. Cerebral palsy.  5. Seizure disorder.  6. Chronic G-tube nutrition.   PLAN:  We will consult with the pharmacist regarding treatment plan on  Tobramycin and Cipro.  We will attempt IV fluids via PICC line.  Further  orders noted in chart.  Of note, this patient also run some lowest blood  pressure and nurses have been advised for this.      [name], M.D.  Electronically Signed     WSL/MEDQ  D:  03/09/2008  T:  03/10/2008  Job:  [id number]

## 2010-06-01 NOTE — Group Therapy Note (Signed)
[name], [name]                 ACCOUNT NO.:  [id number]   MEDICAL RECORD NO.:  [id number]          PATIENT TYPE:  INP   LOCATION:  IC04                          FACILITY:  [facility]   PHYSICIAN:  Scott A. Gerda Diss, MD    DATE OF BIRTH:  04/30/1985   DATE OF PROCEDURE:  DATE OF DISCHARGE:                                 PROGRESS NOTE   It should be noted that her sodium has gone down to 129.  When she first  came in, it was 127, but then it went up to 136, but now it is coming  back down, my gut feeling says that she is just getting too much water  with her meds and with her feeds, so we are back down on that.  Her  lungs are sounding better as upper airway congestion should be noted.  White count looks good.  Hemoglobin is acceptable.  Anemia probably of  chronic disease and her ferritin looks good at 154.  Her respiratory  culture did not show any specific organisms.  We will continue with the  current measures we are doing hopefully and possibly home in the next  couple of days.      Scott A. Gerda Diss, MD  Electronically Signed     SAL/MEDQ  D:  03/12/2008  T:  03/12/2008  Job:  [id number]

## 2010-06-01 NOTE — Group Therapy Note (Signed)
[name], [name]                 ACCOUNT NO.:  [id number]   MEDICAL RECORD NO.:  [id number]          PATIENT TYPE:  INP   LOCATION:  IC07                          FACILITY:  [facility]   PHYSICIAN:  Edward L. Juanetta Gosling, M.D.DATE OF BIRTH:  Nov 09, 1985   DATE OF PROCEDURE:  10/03/2006  DATE OF DISCHARGE:                                 PROGRESS NOTE   Ms. [place] is about the same.  She is actually more alert this morning  than she was yesterday.  She has had a new problem during the night  which is that she is anemic, her hemoglobin level 7.4.  She has not had  any stools, so we have not had the opportunity to check them for blood.  She of course has a PEG tube and has had tube feedings through that.  Otherwise she looks, I think, better in general.   Her pulse is in the 80s, blood pressure is actually the best I have seen  in a while at 103/57.  She does run a very low blood pressure in  general.  CHEST:  Clearer, she still has some rhonchi.  HEART:  Regular.   Her hemoglobin level is 7.4, hematocrit 22.3, Tegretol level was six  which is therapeutic.  BMET shows a BUN of 3, creatinine 0.3 and blood  gas 40%, 250, rate of 25 of PEEP, pH 7.41, PCO2 of 35, PO2 of 114.  Her  chest x-ray shows little change.   ASSESSMENT:  She has respiratory failure, multifactorial.  She is anemic  and the assumption would be that she has had some sort of bleeding.  This is a fairly dramatic change in her hemoglobin.  She has not had any  bowel movements so I have not check stool yet but we will go ahead and  do that.  She is probably going to need some blood.  I am going to go  ahead and ask for weaning per protocol with the understanding that we  can put her back on the ventilator since she has a tracheostomy tube, we  can even put her back on overnight if we want.  I may just leave her  attached to see how she does at least.      Ramon Dredge L. Juanetta Gosling, M.D.  Electronically Signed     ELH/MEDQ  D:   10/03/2006  T:  10/03/2006  Job:  [id number]

## 2010-06-01 NOTE — Discharge Summary (Signed)
NAMEGUSTA, [name]                 ACCOUNT NO.:  [id number]   MEDICAL RECORD NO.:  [id number]          PATIENT TYPE:  INP   LOCATION:  IC04                          FACILITY:  [facility]   PHYSICIAN:  Osvaldo Shipper, MD     DATE OF BIRTH:  Nov 12, 1985   DATE OF ADMISSION:  03/07/2008  DATE OF DISCHARGE:  02/27/2010LH                               DISCHARGE SUMMARY   Please see H and P dictated by Dr. Gerda Diss for details regarding the  patient's presenting illness.   The patient's PMD is Dr. Simone Curia.   DISCHARGE DIAGNOSIS:  1. Pneumonia, community-acquired, improved.  2. History of cerebral palsy.  3. Acid reflux disease.  4. Chronic constipation.   BRIEF HOSPITAL COURSE:  Briefly, this is a 25 year old Caucasian female  who has a history of cerebral palsy who does not really communicate  much.  Has a tracheostomy and I think has a G-tube.  The patient was  admitted with cough, fever, and was found to have a pneumonia.  The  patient was put on antibiotics.  She was monitored in the intensive care  unit.  She responded to the treatment and has been stable for the last  couple of days.  She was told by Dr. Gerda Diss yesterday on sign out that  she should be ready for discharge today.  The patient has had an  uneventful night.  Her vital signs are all stable as mentioned in my  progress note.  So, at this time, I do not see any reason why she cannot  go home.  Family should be here later today to take her home.  She does  have a central line, which can be taken out when she is ready to be  discharged.   DISCHARGE MEDICATIONS:  1. Fleet Enema as needed.  2. Bactroban ointment topically as needed.  3. Diastat 5 mg rectally as needed for seizures.  4. Nystatin ointment topically 3 times daily.  5. Hydrocortisone ointment 2.5% topically 3 times daily.  6. Zovirax 5% ointment topically as needed for cold sores.  7. Children's ibuprofen 50 mg in 12.5 cc by G-tube every 6-8 hours as  needed for fever or pain.  8. Oxygen 1-4 liters per minute.  9. Normal saline bolus as needed for thick secretions.  10.Sorbitol solution as needed for constipation.  11.Robitussin 10-15 mL as needed for cough.  12.Xanax 0.5 mg as needed every 6 hours for anxiety.  13.Ativan 0.5 mg every 6 hours as needed for agitation.  14.Ensure 1 can 4 times a day.  15.Depakene 750 mg liquid suspension 8:00 a.m. and 2:00 p.m. and 1000      mg I believe at night time.  16.The dose of Depakote has not been clear.  17.Zyrtec 10 mg daily.  18.Baclofen suspension 20 mg 4 times a day.  19.Tegretol 200 mg at 10:00 a.m. and at 3:00 p.m. and 300 mg at      bedtime.  20.Prilosec 20 mg prior to meals.  21.Sodium bicarbonate prior to meals.  22.Robinul 2.5 mg twice daily.  23.Benefiber  24.Tylenol  Extra Strength every 6 hours as needed.  25.Xopenex every 4 hours as needed.   No antibiotics have been prescribed as she has completed all treatment.   Follow up with Dr. Gerda Diss.  Diet, physical activity as before.   Total time on this encounter less than 30 minutes.      Osvaldo Shipper, MD  Electronically Signed     GK/MEDQ  D:  03/15/2008  T:  03/15/2008  Job:  [id number]   cc:   [name], M.D.  Fax: [phone]

## 2010-06-01 NOTE — Consult Note (Signed)
[name], [name]                 ACCOUNT NO.:  [id number]   MEDICAL RECORD NO.:  [id number]          PATIENT TYPE:  INP   LOCATION:  IC07                          FACILITY:  [facility]   PHYSICIAN:  Ladona Horns. Neijstrom, MD  DATE OF BIRTH:  1985/07/08   DATE OF CONSULTATION:  DATE OF DISCHARGE:                                 CONSULTATION   DIAGNOSES:  1. Thrombocytopenia and anemia.  2. Probable pulmonary infection.  3. Mental retardation with probably cerebral palsy.  4. Chronic seizure disorder.  5. History of tracheostomy placement.  6. 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  7. Allergy to ceftazidime and morphine.  8. History of bowel obstruction in the past.  9. History of chronic respiratory failure in the past.   MEDICATIONS:  Listed in the history and physical per Dr. Gerda Diss, I will  not repeat them here.  She is on a number of chronic medications,  including Depakote and Tegretol, metoclopramide, Prilosec, Robinul.   This unfortunate 25 year old woman was admitted with possible signs of  low grade fever and not doing well according to her mother.  She had two  episodes of seizures that lasted several minutes just prior to  admission.  She lives at home with her mother who takes care of her.  Her mother notices she was coughing more and some thick sputum was being  brought up and the seizures of course.   At the time of her admission, temperature 99.9 degrees, blood pressure  109/71, pulse 129, respiratory was 22-24.  Saturation was 100% on 6  liters of oxygen via her tracheostomy.   Dr. Gerda Diss felt that she had some scattered crackles on both sides but  no wheezes.  Her white count was 13,000, hemoglobin 13, hematocrit 40,  platelet 103,000.  Her BUN was 17, but her creatinine was low at 0.4 and  therefore she had an elevated BUN/creatinine ratio of greater than 30.  Calcium was 9.3.  Valproic acid level was 175, Carbamazepine level  was  less than 2.   It was thought that she might have aspiration pneumonia and she was  placed on antibiotics.   Her platelets have continued to decline.  Her CBC today shows a platelet  however, 72,000, white count is 5000, hemoglobin 9.8.  That is after 2  units of packed cells.  She did have an anemia panel, my request, which  was done on blood prior to transfusion.  Retic count was 3.3%, absolute  retic count was only 91,000.  Serum iron and TIBC were consistent with a  picture of anemia of chronic disease.  B12 level was 851.  Folic acid  greater than 20.  Ferritin was 40, and that is in the face of acute  illness.   I did a hemolytic workup, which was negative with a normal haptoglobin  and she had no evidence for DIC with a fibrinogen of 346.  D-dimer 0.41,  which is normal.  Her PTT was 31.  PT was normal at 13.7.  Occult blood  was  negative.   Blood cultures thus far have been negative.  They were drawn on  10/01/2006.   Her BUN and creatinine have been still low with a BUN on September 15 of  10 and creatinine actually less than 0.3.   BUN today was 3, creatinine less than 0.3, and that is with rehydration.   She is noncommunicative.  She does not truly make eye contact in my  opinion.   Her vital signs are recorded in the electronic medical record.  She was  not febrile when I examined her yesterday.  Skin was warm and dry to the  touch.  She had multiple deformities over extremities and almost no  muscle mass.  I could not feel pulses in her feet.  She had very small  radial pulse on each side.  She had no obvious adenopathy.  She had  lungs which I thought showed a few rhonchi in left anterior chest.  Her  heart showed a tachycardia right around 100-110.  I did not hear an S3  gallop.  There was a soft grade 1/6 systolic ejection murmur.  She had  no hepatosplenomegaly and again no adenopathy.  She had no obvious  breast masses.  She had her tongue sticking out  and really could not  follow commands.  Her pupils did seem to respond to light.  She had sort  of wandering eye movements.   This lady's picture is somewhat difficult to define.  I do not know how  much of this is nutritional.  She had obviously some hemoconcentration  with a very high BUN/creatinine ratio.   She does not have any evidence for DIC.  She does not have any evidence  for hemolysis.  Her platelets have clearly been low on other  hospitalizations, and she is not on Lovenox so that is not the issue.   She does not have obvious splenomegaly, and indeed she has had CT scans  as recently as March of this year, which did not reveal any splenomegaly  that was obvious or hepatocellular problems.  She also did not have any  adenopathy.   She is being fed through a PEG tube as mentioned.   This young lady has some thrombocytopenia, anemia, which is probably due  to acute bone marrow suppression, but I think she most likely has some  other chronic issues that may have to do with bone marrow integrity.   They may be all related to nutrition realistically, but a bone marrow  biopsy in my opinion is not indicated in this young woman.  Supportive  care is all that I would offer her.  I think her platelets can be  watched.  I have discussed her case extensively with Dr. Lubertha South.  I think the other thing that might be tried is something simple, and  that is IV sodium ferric gluconate infusions times 3-6 doses to see if  that would bring up her platelets and/or her hemoglobin, since her  ferritin of 40 in an acute illness setting may not be reliable, so he  and I have discussed it, and he will try that and we will see how she  fares over the next number of days.      Ladona Horns. Mariel Sleet, MD  Electronically Signed     ESN/MEDQ  D:  10/04/2006  T:  10/05/2006  Job:  [id number]

## 2010-06-01 NOTE — Consult Note (Signed)
[name], [name]                 ACCOUNT NO.:  [id number]   MEDICAL RECORD NO.:  [id number]          PATIENT TYPE:  INP   LOCATION:  IC07                          FACILITY:  [facility]   PHYSICIAN:  Kofi A. Gerilyn Pilgrim, M.D. DATE OF BIRTH:  Jun 11, 1985   DATE OF CONSULTATION:  10/02/2006  DATE OF DISCHARGE:                                 CONSULTATION   HISTORY:  This is a 25 year old white female who has a baseline history  of cerebral palsy and chronic epilepsy.  The patient was admitted to the  hospital after having 2 episodes of seizures.   HISTORY OF PRESENT ILLNESS:  The patient was noted to be febrile 99, was  sent to the hospital.  She has had a fever spike of 101 and thick  sputum.  The presumed diagnosis is aspiration pneumonia.  The patient  has been placed on a ventilator and given appropriate antibiotics. It  appears that the patient may have missed doses of Tegretol as her  Tegretol level on admission was sub-therapeutic and believe family  members confirm some issues with not taking the medication the night  before her seizures.   PAST MEDICAL HISTORY:  1. Mental retardation.  2. Epilepsy.  3. Cerebral palsy.  4. Bowel obstruction.  5. Chronic respiratory failure, status post permanent trach placement.  6. She has a permanent PEG.   ADMISSION MEDICATIONS:  1. Depakote liquid 750 mg in the morning.  2. Depakote liquid 1000 mg b.i.d. at 2:00 p.m. and 8:00 p.m.  3. Tegretol 100 mg b.i.d.  4. Tegretol 100 mg 3 at bedtime.  5. Reglan 5 mg 4 times day.  6. Zyrtec 10 mg.  7. Baclofen 20 mg 4 times a day.  8. Prilosec 20 mg b.i.d.  9. Robinul.  10.GlycoLax.  11.Diastat 5 mg p.r.n.  12.Ativan 1 mg p.r.n.   REVIEW OF SYSTEMS:  Limited.  At present, the patient was given the  Ativan in the emergency room as she was seen. She was also given some  Ativan in the ICU to help relax her and she probably had some agitation  issues.   PHYSICAL EXAMINATION:  VITAL SIGNS:   Temperature 97.1, pulse 110,  respirations 25.  GENERAL:  The patient is on a ventilator through a tracheostomy.  The  patient opens her eyes spontaneously.  She responds to name calling and  appears to track and focuses.  She does not follow command.  CRANIAL NERVE EVALUATION:  Pupils are 5 mm, brisk and reactive.  She has  full extraocular movements.  Corneal reflexes are intact.  The patient's  muscle strength, moves slightly to pain, but appears to be symmetric.  There may be some evidence of facial plegia.  MOTOR EXAMINATION:  Shows severe spasticity throughout, more on the  right side.  She does seem to move the left more than the right with  antigravity strength noted in the left upper extremity. Reflexes are  brisk.  She responds to pain bilaterally.   LABORATORY DATA:  Tegretol undetected.  Sodium 140, potassium 3.9,  chloride 98, CO2 of 30,  glucose 84, BUN 17, creatinine 0.4, potassium  9.4.  Depakote of 160. Urinalysis negative for nitrites, leukocytes,  essentially negative exam.   X-rays show right lower lobe atelectasis/scarring.   IMPRESSIONS:  1. Recurrent seizures in a patient with baseline history of epilepsy,      likely due to sub-therapeutic Tegretol.  2. Severe mental retardation.  3. Cerebral palsy.  4. Aspiration pneumonia due to seizures.   RECOMMENDATIONS:  1. Agree with continuation of both antiepileptic medications.  2. Recheck Tegretol level in the a.m.  3. We may consider an EEG if she does not return to baseline.  4. Follow up of additional testing which has been ordered, including      imaging.   Thank you for this consultation.      Kofi A. Gerilyn Pilgrim, M.D.  Electronically Signed     KAD/MEDQ  D:  10/03/2006  T:  10/03/2006  Job:  [id number]

## 2010-06-01 NOTE — Group Therapy Note (Signed)
NAMESHERENA, MACHORRO                 ACCOUNT NO.:  [id number]   MEDICAL RECORD NO.:  [id number]          PATIENT TYPE:  INP   LOCATION:  IC07                          FACILITY:  [facility]   PHYSICIAN:  Edward L. Juanetta Gosling, M.D.DATE OF BIRTH:  [birth year]   DATE OF PROCEDURE:  10/05/2006  DATE OF DISCHARGE:                                 PROGRESS NOTE   Ms. Gilbertson seems better this morning.  She is more alert and her  secretions have thinned according to the a respiratory staff.  She is  growing Pseudomonas in her sputum.  Otherwise her chest x-ray also looks  better.  Her chest is clearer.  Her blood pressure is still about the  same.  Her hemoglobin level has stabilized.   ASSESSMENT:  She has Pseudomonas in her sputum, we are certainly going  to have to treat that.  She has had an anemia which is of unknown  etiology, she had fairly marked anemia that occurred suddenly but she  has stabilized that.  She of course has her chronic medical problems  including seizure disorder, mental retardation, cerebral palsy.   PLAN:  I am going to discuss with Dr. Gerda Diss about fairly long-term  treatment for the Pseudomonas to see if we can clear up any further  problems in her chest.      Ramon Dredge L. Juanetta Gosling, M.D.  Electronically Signed     ELH/MEDQ  D:  10/05/2006  T:  10/05/2006  Job:  [id number]

## 2010-06-04 NOTE — H&P (Signed)
[name], [name]                 ACCOUNT NO.:  [id number]   MEDICAL RECORD NO.:  [id number]          PATIENT TYPE:  INP   LOCATION:  IC04                          FACILITY:  [facility]   PHYSICIAN:  Dalia Heading, M.D.  DATE OF BIRTH:  01-31-1985   DATE OF ADMISSION:  04/05/2006  DATE OF DISCHARGE:  LH                              HISTORY & PHYSICAL   CHIEF COMPLAINT:  Vomiting.   HISTORY OF PRESENT ILLNESS:  The patient is a 25 year old, white female,  who suffers from cerebral palsy and is noncommunicative.  Her caregivers  have reported that over the past 12 hours, the patient has had multiple  episodes of emesis and abdominal distention.  They did give her a  Fleet's enema this morning and some gas and stool were evacuated.  Her  abdomen is softer than it was previously.   PAST MEDICAL HISTORY:  1. A similar episode, which was treated at [facility] last      year for two months.  They were told at the time that she has      slowing of her intestines.  2. Past medical history as noted above.  3. Seizure disorder.   PAST SURGICAL HISTORY:  1. Tracheostomy.  2. Gastrostomy tube.   CURRENT MEDICATIONS:  1. Reglan one teaspoon q.i.d. per PEG.  2. Zyrtec 10 ml at 9 a.m. per PEG.  3. Depakote 250 mg per 5 ml, 15 ml at 8 a.m. and 2 p.m., and 20 ml at      9 p.m.  4. Tylenol p.r.n.  5. Tegretol 200 mg in the morning and the afternoon per PEG, 300 mg      per PEG q.h.s.  6. Baclofen 5 mg per ml, 4 ml per PEG q.i.d.  7. Robinul 1 mg per tablet, 2.5 tablets per PEG t.i.d.  8. Ativan 0.5 mg p.r.n. seizures.   ALLERGIES:  FORTAZ.   REVIEW OF SYSTEMS:  Noncontributory.   PHYSICAL EXAMINATION:  GENERAL:  The patient is a 78 pound white female  in a contracted condition.  She is not communicative.  VITAL SIGNS:  Stable.  The patient is afebrile.  LUNGS:  Intermittent rhonchi present.  No wheezing is noted.  HEART:  Regular rate and rhythm with no S3, S4, or murmurs.  ABDOMEN:  Soft, but mildly distended.  A gastrostomy tube is in place in  the left upper quadrant.  No surgical incisions are noted.  No hernias  are noted.  RECTAL:  Examination was unremarkable.   Chem-7 was remarkable for a sodium of 130, potassium 4.1, chloride 93,  glucose 111, BUN 12, creatinine 0.4, bicarbonate 27.  Liver profile is  within normal limits.  White blood cell count is 7.4, hematocrit 42.5,  platelet count 125.  A differential reveals 53 segs, 30 lymphs, 16  monos.  Amylase is normal, lipase is slightly elevated at 116.   CT scan of the abdomen and pelvis reveals significantly dilated small  bowel loops with evidence of a small amount of free air in close  proximity to the gastrostomy  tube site.  The colon is collapsed.  There  is no transition zone for a specific etiology for the bowel obstruction.  No free fluid is noted.  Bibasilar atelectasis is noted.   IMPRESSION:  Small bowel obstruction versus ileus.  The pneumoperitoneum  is probably secondary to air tracking up the gastrostomy tube as the  patient does not have a rigid abdomen.  She also does not have a  leukocytosis suggestive of a perforation.   PLAN:  The patient will be admitted to the ICU for further monitoring  and nasogastric tube decompression.  It is variable at this point as to  whether surgical exploration is warranted or not.  The family was  offered transfer to a tertiary care facility, but they would prefer to  [facility] at [facility] at this time.  They are fully aware that she may need  surgery in the near future.  Dr. Lubertha South has been consulted, and he  will be monitoring the patient closely with me.      Dalia Heading, M.D.  Electronically Signed     MAJ/MEDQ  D:  04/05/2006  T:  04/05/2006  Job:  [id number]   cc:   [name], M.D.  Fax: [phone]

## 2010-06-04 NOTE — Discharge Summary (Signed)
[name], HAI                 ACCOUNT NO.:  [id number]   MEDICAL RECORD NO.:  [id number]          PATIENT TYPE:  INP   LOCATION:  IC07                          FACILITY:  [facility]   PHYSICIAN:  [name], M.D.DATE OF BIRTH:  August 05, 1985   DATE OF ADMISSION:  10/01/2006  DATE OF DISCHARGE:  09/24/2008LH                               DISCHARGE SUMMARY   FINAL DIAGNOSIS:  1. Respiratory failure.  2. Pseudomonas colonization of tracheal secretions  3. Seizure disorder with acute exacerbation along with subtherapeutic      Tegretol level.  4. Chronic cerebral palsy.  5. Chronic feeding tube.  6. Chronic trache.  7. Full code status, rediscussed and reaffirmed.   FINAL DISPOSITION:  1. Patient discharged home.  2. Patient to maintain Primaxin 250 mg IV q.6 h. for a complete 2 week      therapy.  3. Maintain all chronic medications.  4. Prednisone taper.  5. Xopenex 1.25 mg q.4 h. via nebulizer while wheezing.  6. Tegretol 100 mg three in the morning, three at night, two mid day.   FOLLOWUP:  With chronic nursing support at home as already established  prior to admission.   INITIAL H&P:  Please see H&P as dictated.   HOSPITAL COURSE:  This patient is a complicated 25 year old female with  history of cerebral palsy since birth, chronic seizure disorder, chronic  tracheostomy, and PEG placement who presented to the hospital day of day  admission with acute decompensation.  She was noted to have fever.  She  had further decline in her already baseline poor respiratory status.  The patient was placed on the ventilator in the emergency room.  She was  admitted to the hospital; and she was started on IV antibiotics,  appropriate cultures were made.  Chest x-ray showed atelectasis with  questionable pneumonia.  Initial blood gas showed relatively poor  numbers, see chart for results.  Dr. Juanetta Gosling was consulted, and he  helped with the ventilator management.  Several days into  admission,  significant reactive airways ensued which slowed the which slowed our  ability to wean the patient off the ventilator.  IV steroids and Xopenex  treatments were added to her therapy in hopes of improving her reactive  airways over a number days, this did improve.   Cultures came back positive for Pseudomonas so her antibiotics were  adjusted accordingly.  Due to fairly significant anemia, Dr. Mariel Sleet  was consulted.  He felt that even her ferritin was borderline okay at  40, he felt that this may be reactive.  Due to her inflammation, and  likely was probably lower.  Based on this, he recommend iron infusion.  This was given via IV over several days.  The patient did have an  improved hemoglobin, at first stabilized and improved hemoglobin with  this.  She also had an improved reticulocyte count.  Initially too, the  patient had significant hypokalemia, but this resolved with therapy.  The patient should be noted to also receive 2 units of packed red blood  cells.  Finally by the day  of discharge,  the patient's clinical  condition had improved to the point that we felt we could get her back  home, particularly considering the excellent 23-hour per day nursing  care that this patient receives at home.  The patient was discharged  home with diagnosis and disposition as noted above.      [name], M.D.  Electronically Signed     WSL/MEDQ  D:  11/08/2006  T:  11/09/2006  Job:  [id number]

## 2010-06-04 NOTE — Consult Note (Signed)
[name], [name]                 ACCOUNT NO.:  [id number]   MEDICAL RECORD NO.:  [id number]          PATIENT TYPE:  INP   LOCATION:  A227                          FACILITY:  [facility]   PHYSICIAN:  R. Roetta Sessions, M.D. DATE OF BIRTH:  31-Mar-1985   DATE OF CONSULTATION:  02/06/2005  DATE OF DISCHARGE:                                   CONSULTATION   GASTROENTEROLOGY EMERGENCY DEPARTMENT CONSULTATION:   DATE OF CONSULTATION:  February 06, 2005   REFERRING PHYSICIAN:  Dr. Lilyan Punt   REASON FOR CONSULTATION:  Colonic distension, impaction.   HISTORY OF PRESENT ILLNESS:  Ms. Lauren Pennebaker. [name] is an unfortunate 25-year-  old Caucasian female with advanced cerebral palsy who is basically  noncommunicative and bed-bound who came to the emergency department today  with some shortness of breath and distress.  She is noncommunicative and it  was difficult to assess this young lady.  She was seen by ED staff and  subsequently Dr. Lilyan Punt.  CT of the chest and abdomen demonstrated  what appears to be an early pneumonia.  She has some diffuse dilation of her  colon with a large fecal impaction on rectal exam.  She has soft brown stool  and no concretions reported.   I was asked to see her to make recommendations on decompressing her colon.  Apparently according to her mother and other caregivers present she has had  some slow down in bowel function over the past year or so.  She has seen Dr.  Lubertha South who has prescribed MiraLax.  This has worked very well for her  in the past but end point usually is overshot and she gets diarrhea and the  MiraLax is stopped.  She is not on any regular maintenance dose of MiraLax.  She typically has a bowel movement in her diaper every other day to two  times weekly.  She occasionally is given an enema.  She is bed-bound and  essentially nonmobile.   It is notable she is also on baclofen and Robinul.  There is no history of  rectal bleeding and no  history of vomiting.  Dr. Alphonzo Grieve over at [facility] put  in her button G-tube some time ago and she has been getting along with 240  mL of Ensure q.6 h along with 120 mL free water q.6 h.   PAST MEDICAL HISTORY:  Advanced cerebral palsy status post tracheostomy;  status post PEG placement (button in place currently); history of seizure  disorder; she has a history of extensive dental work down at the [employer]  [facility] when she was a child.   CURRENT MEDICATIONS:  1.  Metoclopramide 5 mg four times daily.  2.  Zyrtec 10 mg once daily.  3.  Baclofen 20 mg q.i.d.  4.  Tegretol.  5.  Prilosec 20 mg daily.  6.  Robinul.  7.  Sodium bicarbonate.   ALLERGIES:  FORTAZ.   FAMILY HISTORY:  Negative for chronic GI or liver disease.   SOCIAL HISTORY:  The patient is a total care patient.  She  has multiple  nurses and family members who look after her at home 24/7.  She lives in  [place] with her family.   REVIEW OF SYSTEMS:  Not obtainable.   PHYSICAL EXAMINATION:  A 25 year old lady with stigmata of cerebral palsy.  She is awake and alert.  She is not verbally communicative.  She is  accompanied by her mother and daughter and home nurse.  She appears to be in  no acute distress.  She has contractures of the upper and lower extremities.  BP 119/61, pulse 110, respiratory rate 20.  She is mildly diaphoretic.  There is no scleral icterus.  Chest exam reveals much upper airway noise and  diffuse rhonchi.  Cardiac exam tachycardic without appreciable murmur,  gallop, or rub.  She has a 20-French button PEG tube in place.  Abdomen is  full.  Bowel sounds are present.  Abdomen is soft and apparently nontender  without obvious mass or organomegaly.  She has contracted lower extremities.  Rectal exam deferred as this has already been done as above.   LABORATORY DATA:  White count 13.6, H&H 12.3 and 36.8, MCV 81.6.  Sodium  133, potassium 3.7, chloride 100, carbon dioxide 25, glucose 120,  BUN 6,  creatinine 0.3, calcium 8.9, albumin 3.6, GOT 24, GPT 10, alkaline  phosphatase 72, bilirubin 0.3, prothrombin time 13.9 seconds, INR 1.1.  Urinalysis negative.   The patient has been admitted to Dr. Fanny Dance service.  She will be  started on Rocephin and Zithromax.   IMPRESSION:  [name] is an unfortunate 25 year old lady with  advanced cerebral palsy now being admitted to the hospital with what appears  to be pneumonia.  She does have diffuse colonic dilation but would not go so  far as to call this a megacolon.  I have reviewed the CT personally and  reviewed it with Dr. Allyson Sabal, the on call radiologist.  She does have some  dilation of her colon and a large bolus of stool in her rectum.  No other  significant intra-abdominal abnormalities.   I suspect her progressive constipation is multifactorial, i.e., related to  immobility and drug effect (baclofen and Robinul).   RECOMMENDATIONS:  1.  Agree with moving towards disimpaction with milk and molasses enema.      She may need multiple enemas to get clear this evening.  2.  We will add MiraLax 17 g per PEG tube daily to her regimen.  I told her      mother and caregivers that we may well overshoot her end point and may      end up with some diarrhea however we will need to titrate the MiraLax      and I would recommend her staying on weekly regimen perhaps every other      day or twice weekly.  We will add a stool softener in the way of Colace      to her regimen.  With her fever pulmonary infection in the presence of a      tracheostomy and ongoing issues with constipation, I think she would do      well with more free water.  We will go ahead and continue her Ensure      tube feedings and we will increase her free water to 240 mL q.6 h.  We      will also go ahead and ask the nutritionist to see this lady tomorrow to     further optimize nutritional  support.  3.  We will also go ahead and check a serum TSH if  not done recently.  I      note that her serum calcium is normal.   Further recommendations to follow.   My impression and recommendations have been reviewed with Dr. Lilyan Punt  this evening via telephone.   I would like to thank Dr. Lilyan Punt for allowing me to see this lady this  evening.      Jonathon Bellows, M.D.  Electronically Signed     RMR/MEDQ  D:  02/06/2005  T:  02/06/2005  Job:  [id number]   cc:   Lorin Picket A. Gerda Diss, MD  Fax: [phone]

## 2010-06-04 NOTE — Discharge Summary (Signed)
[name], [name]                 ACCOUNT NO.:  [id number]   MEDICAL RECORD NO.:  [id number]          PATIENT TYPE:  INP   LOCATION:  A227                          FACILITY:  [facility]   PHYSICIAN:  [name], M.D.DATE OF BIRTH:  1985/12/12   DATE OF ADMISSION:  02/06/2005  DATE OF DISCHARGE:  01/24/2007LH                                 DISCHARGE SUMMARY   DISCHARGE DIAGNOSES:  1.  Clinical pneumonia with congestion, cough, elevated white blood count      and increased edema on computed tomography scan.  2.  Constipation with potential secondary pain, no true obstruction.  3.  Cerebral palsy.  4.  Seizure disorder.   DISPOSITION:  The patient is discharged back to home.   DISCHARGE MEDICATIONS:  1.  Maintain all chronic medicines plus Levaquin 500 mg daily for the next 7      days.  2.  Add 5 tbl of Benefiber daily.  3.  MiraLax 1/2 capsule daily as needed for constipation.   HISTORY OF PRESENT ILLNESS:  Please see H&P as dictated.   HOSPITAL COURSE:  This patient is a 25 year old female with history of  severe cerebral palsy who presented to the emergency department because of  increased lung secretions, drop in oxygen saturation, cough and congestion,  elevated white blood count.  Chest x-ray showed no true infiltrate.  The  patient did have low-grade fever.  CT scan raised a question of some  congestion in the chest, edema versus early pneumonia.  In addition, at home  the family has felt that this young woman has been having more difficulty  with pain and discomfort and she has not had any good bowel movements for  quite some time.  GI folks were consulted.  CT scan showed significant fecal  impaction with colon dilatation.  The patient was given enemas and  medication to prompt bowel movements.  Over the next 36 hours, the patient  had a good response with several firm hard bowel movements.  Over the next  several days, her baseline agitation improved.  Her congestion  and cough  also improved.   On the day of discharge, the patient was feeling better and back to her  stable baseline status.  The patient was discharged with diagnoses and  disposition as noted above.      [name], M.D.  Electronically Signed    WSL/MEDQ  D:  02/17/2005  T:  02/17/2005  Job:  [id number]

## 2010-06-04 NOTE — Discharge Summary (Signed)
[name], [name]                 ACCOUNT NO.:  [id number]   MEDICAL RECORD NO.:  [id number]          PATIENT TYPE:  INP   LOCATION:  A215                          FACILITY:  [facility]   PHYSICIAN:  [name], M.D.DATE OF BIRTH:  [birth year]   DATE OF ADMISSION:  04/05/2006  DATE OF DISCHARGE:  03/24/2008LH                               DISCHARGE SUMMARY   FINAL DIAGNOSIS:  1. Small bowel ileus/obstruction.  2. Chronic cerebral palsy.  3. Seizure disorder.  4. Chronic reflux.  5. Indwelling trach.  6. Indwelling feeding tube.   FINAL DISPOSITION:  1. Patient discharged to home.  2. Maintain all usual medications.  3. Compress Ensure Plus feedings every 5 hours.  4. Sorbitol 30 mL at on the third day if no bowel movement.  5. MiraLax one tab daily.   FOLLOW UP:  As scheduled.   INITIAL HISTORY & PHYSICAL:  Please see H&P as dictated.   HOSPITAL COURSE:  This patient is a complicated young lady who is 25-  year-old white female with __________ history of seizure disorder,  cerebral palsy, severe mental retardation with chronic trache and  chronic indwelling feeding tube who presented to the ER the day of  admission with irritability and vomiting.  Dr. Lovell Sheehan saw her in the  ER, felt she was having a small bowel obstruction equivalent.  He went  ahead returned to the hospital.  He consulted me.  She was a bit  hyponatremic.  She had been actively vomiting.  She had diminished  urinary output.  We went ahead and put her in the ICU, gave her  appropriate fluids.  Suction was applied.  Over the next several days,  the patient had a re-emergence of good normal bowel functioning.  A  number of adjustments were made in her fluids and with other  interventions to improve her tendency towards constipation.  At this  time, we are not sure of the actual etiology of this temporary  ileus/small-bowel obstruction.  However, with her excellent improvement  and  excellent bowel  function upon discharge, along with soft abdomen and  normal bowels for her, we felt that the patient was definitely stable  for discharge home.  The patient was discharged home with diagnosis same  as disposition noted above.      [name], M.D.  Electronically Signed     WSL/MEDQ  D:  04/24/2006  T:  04/24/2006  Job:  [id number]

## 2010-06-04 NOTE — Discharge Summary (Signed)
[name], [name]                 ACCOUNT NO.:  [id number]   MEDICAL RECORD NO.:  [id number]          PATIENT TYPE:  INP   LOCATION:  IC06                          FACILITY:  [facility]   PHYSICIAN:  [name], M.D.DATE OF BIRTH:  March 25, 1985   DATE OF ADMISSION:  DATE OF DISCHARGE:  06/04/2008LH                               DISCHARGE SUMMARY   TRANSFER SUMMARY:   FINAL DIAGNOSES:  1. Partial bowel obstruction.  2. Status epilepticus.  3. Respiratory failure.  4. Febrile illness, resolved.  5. Chronic seizure disorder.  6. In-dwelling feeding tube.  7. Chronic cerebral palsy equivalent.  8. Chronic indwelling trach.   DISPOSITION:  The patient transferred for further especially care at  [facility].   HOSPITAL COURSE:  This patient is a complicated 25 year old patient with  a history of cerebral palsy, severe nature seizure disorder, chronic  indwelling feeding tube, and a chronic trach, who presented to the  hospital on the day of admission with acute complaints.  The patient had  a fever for about a day.  She also had multiple episodes of vomiting.  X-  ray revealed a partial small-bowel obstruction.  Based on this, the  patient was admitted to the hospital.  She was placed on bowel rest.  She was given IV fluids.  Over the next several days, she had  difficulties with this off and on.  The GI folks were consulted.  They  made some adjustments in her medication.  Several days into the  presentation, the patient experienced status epilepticus.  This was  treated with IV lorazepam and rectal diazepam, along with a loading dose  of Cerebyx.  All this was done while the patient was in the ICU.  The [facility] folks were nervous about her low blood pressure and initiated  dopamine.  It should be noted she has a baseline quite low blood  pressure, and that she maintained great urine output despite her 85  systolic level blood pressures since she is only 67 pounds,  despite her  age.  I felt like her blood pressure was normal for her, however, we  honored [facility]'s desire to cover with dopamine.  This was discussed with  the family.  We continued to attempt to wean off dopamine.  On the day  of transfer, the patient experienced a significant worsening in her  respiratory status.  A chest x-ray revealed potential infiltrate.  An  attempt was made to place the patient on the ventilator due to an  uncuffed trach, this  encountered some difficulties.  We have no one here who can reinsert  cuff trach in this situation.  Based on this along with all her chronic  and ongoing health difficulties, we felt she might be better served by  transfer [facility].  We contacted them and the patient was transferred  with diagnosis and disposition as noted above.      [name], M.D.  Electronically Signed     WSL/MEDQ  D:  07/14/2006  T:  07/14/2006  Job:  [id number]

## 2010-06-04 NOTE — H&P (Signed)
[name], [name]                 ACCOUNT NO.:  [id number]   MEDICAL RECORD NO.:  [id number]          PATIENT TYPE:  INP   LOCATION:  A227                          FACILITY:  [facility]   PHYSICIAN:  Scott A. Gerda Diss, MD    DATE OF BIRTH:  Mar 02, 1985   DATE OF ADMISSION:  02/06/2005  DATE OF DISCHARGE:  LH                                HISTORY & PHYSICAL   CHIEF COMPLAINT:  Pain and discomfort.   HISTORY OF PRESENT ILLNESS:  This is a 25 year old white female with a  history of severe cerebral palsy who has presented to the ED because of  increased lung secretions, as well as a slight drop in O2 saturations, plus  also increased agitation and pain and discomfort.  No regurgitation through  the G tube.  Has not had a bowel movement today.  Last bowel movement  yesterday.  Seemingly in pain and discomfort this morning, coughing,  difficulty with breathing due to the increased secretions.   PAST MEDICAL HISTORY:  Severe cerebral palsy.  The patient has never been  ventilator dependent.  She has a tracheostomy in place.  She also has a G  tube in place.  She has problems with spasticity, secretions, reflux,  scoliosis.   REVIEW OF SYSTEMS:  Increased pain and discomfort.  Increased heart rate.  The night nurse who stays with her said that she had increased O2.   MEDICATIONS:  1.  Reglan one teaspoon q.i.d. per PEG.  2.  Zyrtec 1 mg per 1 cc, 10 cc at 9 a.m. per PEG.  3.  Depakote 250 mg per 5 cc, 15 cc at 8 a.m. and 2 p.m. and 20 cc at 9 p.m.  4.  Tylenol p.r.n.  5.  Tegretol 100 mg per chew tab, 200 mg in the morning and in the afternoon      per PEG; Tegretol 100 mg per chew tab, 3 tablets q.h.s. per PEG.  6.  Baclofen 5 mg per 1 mL 4 cc q.i.d. per PEG.  7.  Robinul 1 mg per tab, 2-1/2 tablets t.i.d.  8.  Ativan 0.5 mg p.r.n. seizure (at home the patient uses Diastat).   ALLERGIES:  FORTAZ.   LABORATORY WORK:  Blood gas 7.437, PCO2 of 37, PO2 of 134.  White count  13.6, hemoglobin  12.3, MCV 81, platelets 143, neutrophils 78.  PTT 29, INR  1.1.  D-dimer 1.69.  Sodium 133, potassium 3.7, glucose 120, BUN 6,  creatinine 0.3.  Liver enzymes negative.  Lipase normal.  BNP less than 30.  TSH 2.44.  Valproic acid 114.  Urinalysis revealed 7-10 RBCs, negative  nitrates and leukocyte esterase.  Bacteria few per HPI.   X-RAYS:  CT of the pelvis and chest and abdomen was done.  Gaseous  distention of the colon noted.  No small bowel distention noted.  Gastrostomy tube in good position.  Fecal impaction of the rectum noted.  Bilateral ovarian cyst noted.  Perihilar haziness also noted.  The patient  with low-grade fever.   PHYSICAL EXAMINATION:  HEENT:  TMs and oral  with no obvious lesions.  NECK:  No masses.  Tracheostomy in place.  LUNGS:  Junky sound bilateral with increased congestion, sometimes  difficulty clearing the tracheostomy tube when she breathes.  ABDOMEN:  Soft.  No obvious distention.  Difficulty getting exam on the  patient.  It is hard to tell if there is abdominal pain or not.  EXTREMITIES:  Thin.  SKIN:  Warm and dry.   ASSESSMENT AND PLAN:  1.  Severe cerebral palsy.  2.  Pneumonia - treat with IV antibiotics.  3.  Tachycardia.  I feel this is due to the fever and pain, but will give a      fluid bolus x1.  4.  Fecal impaction.  I spoke with Dr. Roetta Sessions.  Will go ahead and do      a series of enemas to try to help alleviate that.  No sign of megacolon.  5.  Doubtful the patient has any type of ruptured intestine, given the scan.      Await serial labs and testing this week.      Scott A. Gerda Diss, MD  Electronically Signed     SAL/MEDQ  D:  02/07/2005  T:  02/07/2005  Job:  [id number]

## 2010-10-28 LAB — BASIC METABOLIC PANEL
BUN: 10
BUN: 11
BUN: 18
BUN: 9
CO2: 25
CO2: 25
CO2: 26
CO2: 28
Calcium: 7.8 — ABNORMAL LOW
Calcium: 7.9 — ABNORMAL LOW
Calcium: 8.1 — ABNORMAL LOW
Calcium: 8.2 — ABNORMAL LOW
Calcium: 8.3 — ABNORMAL LOW
Calcium: 8.4
Chloride: 101
Chloride: 107
Chloride: 114 — ABNORMAL HIGH
Creatinine, Ser: 0.24 — ABNORMAL LOW
Creatinine, Ser: 0.26 — ABNORMAL LOW
Creatinine, Ser: 0.27 — ABNORMAL LOW
Creatinine, Ser: <0.3 — ABNORMAL LOW
Creatinine, Ser: <0.3 — ABNORMAL LOW
GFR calc Af Amer: >60
GFR calc Af Amer: >60
GFR calc non Af Amer: >60
GFR calc non Af Amer: >60
GFR calc non Af Amer: >60
Glucose, Bld: 102 — ABNORMAL HIGH
Glucose, Bld: 124 — ABNORMAL HIGH
Glucose, Bld: 127 — ABNORMAL HIGH
Glucose, Bld: 145 — ABNORMAL HIGH
Glucose, Bld: 84
Glucose, Bld: 86
Potassium: 3.3 — ABNORMAL LOW
Potassium: 4.6
Potassium: 4.7
Potassium: 4.8
Sodium: 136
Sodium: 137
Sodium: 138
Sodium: 138
Sodium: 140
Sodium: 140

## 2010-10-28 LAB — RETICULOCYTES
RBC.: 3.41 — ABNORMAL LOW
RBC.: 3.63 — ABNORMAL LOW
RBC.: 3.67 — ABNORMAL LOW
RBC.: 3.8 — ABNORMAL LOW
RBC.: 3.86 — ABNORMAL LOW
Retic Count, Absolute: 134.3
Retic Count, Absolute: 80.6
Retic Count, Absolute: 85.3
Retic Ct Pct: 2.4
Retic Ct Pct: 2.4
Retic Ct Pct: 2.8
Retic Ct Pct: 3.7 — ABNORMAL HIGH
Retic Ct Pct: 3.7 — ABNORMAL HIGH

## 2010-10-28 LAB — BLOOD GAS, ARTERIAL
Acid-base deficit: 3.8 — ABNORMAL HIGH
Bicarbonate: 20.1
Bicarbonate: 22.1
Bicarbonate: 22.2
Bicarbonate: 23.1
Bicarbonate: 23.4
FIO2: 0.4
FIO2: 0.4
FIO2: 40
MECHVT: 250
MECHVT: 250
O2 Saturation: 95.1
O2 Saturation: 98.6
O2 Saturation: 99.1
PEEP: 5
Patient temperature: 37
Patient temperature: 37
RATE: 20
RATE: 20
RATE: 20
TCO2: 19.2
pCO2 arterial: 37.6
pH, Arterial: 7.383
pH, Arterial: 7.411 — ABNORMAL HIGH
pH, Arterial: 7.415 — ABNORMAL HIGH
pO2, Arterial: 131 — ABNORMAL HIGH
pO2, Arterial: 65.4 — ABNORMAL LOW
pO2, Arterial: 80.5

## 2010-10-28 LAB — DIFFERENTIAL
Basophils Absolute: 0
Basophils Absolute: 0
Basophils Absolute: 0
Basophils Relative: 0
Basophils Relative: 0
Basophils Relative: 0
Blasts: 0
Eosinophils Absolute: 0
Eosinophils Absolute: 0
Eosinophils Absolute: 0
Eosinophils Relative: 0
Eosinophils Relative: 0
Eosinophils Relative: 0
Eosinophils Relative: 1
Lymphocytes Relative: 12
Lymphocytes Relative: 12
Lymphocytes Relative: 16
Lymphocytes Relative: 17
Lymphocytes Relative: 18
Lymphocytes Relative: 34
Lymphocytes Relative: 8 — ABNORMAL LOW
Lymphs Abs: 0.4 — ABNORMAL LOW
Lymphs Abs: 0.8
Lymphs Abs: 1
Lymphs Abs: 1
Lymphs Abs: 1.8
Lymphs Abs: 2
Monocytes Absolute: 0.2
Monocytes Absolute: 0.3
Monocytes Absolute: 0.4
Monocytes Relative: 10
Monocytes Relative: 4
Monocytes Relative: 5
Monocytes Relative: 8
Neutro Abs: 2.3
Neutro Abs: 4.5
Neutro Abs: 5.5
Neutro Abs: 5.6
Neutro Abs: 8.3 — ABNORMAL HIGH
Neutrophils Relative %: 49
Neutrophils Relative %: 55
Neutrophils Relative %: 69
Neutrophils Relative %: 78 — ABNORMAL HIGH
Neutrophils Relative %: 79 — ABNORMAL HIGH
Neutrophils Relative %: 81 — ABNORMAL HIGH
Neutrophils Relative %: 82 — ABNORMAL HIGH
Smear Review: DECREASED
nRBC: 0

## 2010-10-28 LAB — APTT: aPTT: 31

## 2010-10-28 LAB — CBC
HCT: 28.7 — ABNORMAL LOW
HCT: 30.8 — ABNORMAL LOW
HCT: 32.1 — ABNORMAL LOW
HCT: 33 — ABNORMAL LOW
Hemoglobin: 10.4 — ABNORMAL LOW
Hemoglobin: 7.6 — CL
Hemoglobin: 9.6 — ABNORMAL LOW
Hemoglobin: 9.8 — ABNORMAL LOW
MCHC: 33.2
MCHC: 33.4
MCHC: 33.7
MCHC: 34
MCV: 82
MCV: 85.7
MCV: 86.7
Platelets: 145 — ABNORMAL LOW
Platelets: 181
Platelets: 226
Platelets: 259
Platelets: 72 — ABNORMAL LOW
Platelets: 83 — ABNORMAL LOW
Platelets: 87 — ABNORMAL LOW
RBC: 2.78 — ABNORMAL LOW
RBC: 3.41 — ABNORMAL LOW
RBC: 3.66 — ABNORMAL LOW
RBC: 3.81 — ABNORMAL LOW
RDW: 13.3
RDW: 13.5
RDW: 14
RDW: 14.6 — ABNORMAL HIGH
WBC: 10.3
WBC: 11.1 — ABNORMAL HIGH
WBC: 4.7
WBC: 5.2
WBC: 5.4
WBC: 5.7
WBC: 6.7
WBC: 8.8

## 2010-10-28 LAB — URINALYSIS, ROUTINE W REFLEX MICROSCOPIC
Glucose, UA: NEGATIVE
Leukocytes, UA: NEGATIVE
Nitrite: NEGATIVE
Protein, ur: 30 — AB

## 2010-10-28 LAB — PROTIME-INR: INR: 1

## 2010-10-28 LAB — CLOSTRIDIUM DIFFICILE EIA: C difficile Toxins A+B, EIA: NEGATIVE

## 2010-10-28 LAB — POTASSIUM: Potassium: 3.8

## 2010-10-28 LAB — FERRITIN: Ferritin: 40 (ref 10–291)

## 2010-10-28 LAB — CROSSMATCH: ABO/RH(D): O POS

## 2010-10-28 LAB — IRON AND TIBC
Iron: 52
TIBC: 199 — ABNORMAL LOW
UIBC: 147

## 2010-10-28 LAB — URINE MICROSCOPIC-ADD ON

## 2010-10-28 LAB — FOLATE: Folate: >20

## 2010-10-28 LAB — VALPROIC ACID LEVEL
Valproic Acid Lvl: 49 — ABNORMAL LOW
Valproic Acid Lvl: 80

## 2010-10-28 LAB — FIBRINOGEN: Fibrinogen: 346

## 2010-10-28 LAB — D-DIMER, QUANTITATIVE: D-Dimer, Quant: 0.41

## 2010-10-28 LAB — OCCULT BLOOD X 1 CARD TO LAB, STOOL: Fecal Occult Bld: NEGATIVE

## 2010-10-29 LAB — CULTURE, BLOOD (ROUTINE X 2)
Culture: NO GROWTH
Report Status: 9192008

## 2010-10-29 LAB — CULTURE, RESPIRATORY W GRAM STAIN

## 2010-10-29 LAB — BLOOD GAS, ARTERIAL
Bicarbonate: 29.8 — ABNORMAL HIGH
O2 Saturation: 92.3
Patient temperature: 37
TCO2: 26.5

## 2010-10-29 LAB — BASIC METABOLIC PANEL
Chloride: 98
Creatinine, Ser: 0.41
GFR calc Af Amer: >60
GFR calc non Af Amer: >60

## 2010-10-29 LAB — CBC
MCV: 81.4
RBC: 4.96
WBC: 13.2 — ABNORMAL HIGH

## 2010-10-29 LAB — CARBAMAZEPINE LEVEL, TOTAL: Carbamazepine Lvl: <2 — ABNORMAL LOW

## 2010-10-29 LAB — VALPROIC ACID LEVEL: Valproic Acid Lvl: 176.2 — ABNORMAL HIGH

## 2010-11-04 LAB — BASIC METABOLIC PANEL
CO2: 24
CO2: 24
CO2: 27
CO2: 24
CO2: 26
CO2: 27
Calcium: 8.5
Calcium: 7.9 — ABNORMAL LOW
Calcium: 8.1 — ABNORMAL LOW
Calcium: 8.4
Chloride: 104
Chloride: 106
Chloride: 105
Creatinine, Ser: <0.3 — ABNORMAL LOW
Creatinine, Ser: <0.3 — ABNORMAL LOW
Creatinine, Ser: <0.3 — ABNORMAL LOW
GFR calc Af Amer: >60
GFR calc non Af Amer: >60
Glucose, Bld: 116 — ABNORMAL HIGH
Glucose, Bld: 77
Glucose, Bld: 110 — ABNORMAL HIGH
Glucose, Bld: 89
Glucose, Bld: 98
Potassium: 4.1
Potassium: 4.9
Potassium: 3.1 — ABNORMAL LOW
Sodium: 133 — ABNORMAL LOW
Sodium: 135
Sodium: 139
Sodium: 136
Sodium: 137
Sodium: 140

## 2010-11-04 LAB — BLOOD GAS, ARTERIAL
Acid-base deficit: 0.5
TCO2: 22.6
pCO2 arterial: 40.2
pH, Arterial: 7.39
pO2, Arterial: 104 — ABNORMAL HIGH

## 2010-11-04 LAB — DIFFERENTIAL
Basophils Absolute: 0
Basophils Absolute: 0
Basophils Relative: 0
Eosinophils Absolute: 0.1
Eosinophils Absolute: 0.1
Eosinophils Relative: 0
Eosinophils Relative: 1
Eosinophils Relative: 1
Lymphocytes Relative: 10 — ABNORMAL LOW
Lymphocytes Relative: 39
Lymphs Abs: 2.4
Lymphs Abs: 2.8
Monocytes Absolute: 0.6
Monocytes Absolute: 0.8 — ABNORMAL HIGH
Monocytes Absolute: 0.9 — ABNORMAL HIGH
Monocytes Absolute: 0.9 — ABNORMAL HIGH
Monocytes Relative: 10
Monocytes Relative: 10
Monocytes Relative: 8
Neutro Abs: 3.6
Neutro Abs: 6
Neutrophils Relative %: 51

## 2010-11-04 LAB — CARBAMAZEPINE LEVEL, TOTAL: Carbamazepine Lvl: 7.2

## 2010-11-04 LAB — PREALBUMIN: Prealbumin: 11.8 — ABNORMAL LOW

## 2010-11-04 LAB — CBC
HCT: 31.7 — ABNORMAL LOW
HCT: 32 — ABNORMAL LOW
HCT: 28.5 — ABNORMAL LOW
Hemoglobin: 10.8 — ABNORMAL LOW
Hemoglobin: 10.8 — ABNORMAL LOW
Hemoglobin: 9.8 — ABNORMAL LOW
Hemoglobin: 9.6 — ABNORMAL LOW
Hemoglobin: 9.7 — ABNORMAL LOW
Hemoglobin: 9.7 — ABNORMAL LOW
Hemoglobin: 9.7 — ABNORMAL LOW
MCHC: 33.8
MCHC: 34.2
MCHC: 34.6
MCHC: 33.4
MCHC: 33.7
MCHC: 33.8
MCHC: 33.9
MCV: 81.7
MCV: 82.9
MCV: 83.5
MCV: 85
Platelets: 245
RBC: 3.5 — ABNORMAL LOW
RBC: 3.82 — ABNORMAL LOW
RBC: 3.92
RBC: 3.42 — ABNORMAL LOW
RDW: 12.1
RDW: 13
RDW: 13.1
RDW: 13
RDW: 13.3
RDW: 13.6
WBC: 7.1

## 2010-11-04 LAB — HEMOGLOBIN AND HEMATOCRIT, BLOOD: HCT: 26.8 — ABNORMAL LOW

## 2010-11-04 LAB — POCT I-STAT 3, ART BLOOD GAS (G3+)
Operator id: 138421
Patient temperature: 37
TCO2: 26
pH, Arterial: 7.417 — ABNORMAL HIGH

## 2010-11-04 LAB — COMPREHENSIVE METABOLIC PANEL
ALT: 10
AST: 15
Alkaline Phosphatase: 46
Calcium: 8.2 — ABNORMAL LOW
Potassium: 4
Sodium: 136
Total Protein: 6.3

## 2010-11-04 LAB — PHENYTOIN LEVEL, TOTAL: Phenytoin Lvl: 13

## 2010-11-04 LAB — PHENYTOIN LEVEL, FREE AND TOTAL
Phenytoin Bound: 10.9
Phenytoin, Free: 1.6 (ref 1.00–2.00)
Phenytoin, Total: 12.5 (ref 10.0–20.0)

## 2010-11-04 LAB — MAGNESIUM: Magnesium: 1.9

## 2010-11-04 LAB — GENTAMICIN LEVEL, TROUGH: Gentamicin Trough: 0.9

## 2010-11-04 LAB — PHOSPHORUS: Phosphorus: 4.2

## 2010-11-04 LAB — FERRITIN: Ferritin: 50 (ref 10–291)

## 2011-02-23 DIAGNOSIS — G8389 Other specified paralytic syndromes: Secondary | ICD-10-CM | POA: Diagnosis not present

## 2011-03-03 DIAGNOSIS — L97409 Non-pressure chronic ulcer of unspecified heel and midfoot with unspecified severity: Secondary | ICD-10-CM | POA: Diagnosis not present

## 2011-03-15 DIAGNOSIS — L03119 Cellulitis of unspecified part of limb: Secondary | ICD-10-CM | POA: Diagnosis not present

## 2011-03-15 DIAGNOSIS — L97509 Non-pressure chronic ulcer of other part of unspecified foot with unspecified severity: Secondary | ICD-10-CM | POA: Diagnosis not present

## 2011-03-17 ENCOUNTER — Encounter (HOSPITAL_COMMUNITY): Payer: Self-pay

## 2011-03-17 ENCOUNTER — Inpatient Hospital Stay (HOSPITAL_COMMUNITY)
Admission: AD | Admit: 2011-03-17 | Discharge: 2011-04-04 | DRG: 207 | Disposition: A | Discharge status: Home Health | Payer: Medicare Other | Attending: Pulmonary Disease | Admitting: Pulmonary Disease

## 2011-03-17 ENCOUNTER — Emergency Department (HOSPITAL_COMMUNITY): Payer: Medicare Other

## 2011-03-17 ENCOUNTER — Other Ambulatory Visit: Payer: Self-pay

## 2011-03-17 DIAGNOSIS — Z931 Gastrostomy status: Secondary | ICD-10-CM | POA: Diagnosis not present

## 2011-03-17 DIAGNOSIS — G40909 Epilepsy, unspecified, not intractable, without status epilepticus: Secondary | ICD-10-CM | POA: Diagnosis present

## 2011-03-17 DIAGNOSIS — G809 Cerebral palsy, unspecified: Secondary | ICD-10-CM | POA: Diagnosis present

## 2011-03-17 DIAGNOSIS — J189 Pneumonia, unspecified organism: Principal | ICD-10-CM | POA: Diagnosis present

## 2011-03-17 DIAGNOSIS — K2100 Gastro-esophageal reflux disease with esophagitis, without bleeding: Secondary | ICD-10-CM | POA: Diagnosis present

## 2011-03-17 DIAGNOSIS — J9620 Acute and chronic respiratory failure, unspecified whether with hypoxia or hypercapnia: Secondary | ICD-10-CM | POA: Diagnosis present

## 2011-03-17 DIAGNOSIS — J9819 Other pulmonary collapse: Secondary | ICD-10-CM | POA: Diagnosis not present

## 2011-03-17 DIAGNOSIS — I959 Hypotension, unspecified: Secondary | ICD-10-CM | POA: Diagnosis not present

## 2011-03-17 DIAGNOSIS — Z9911 Dependence on respirator [ventilator] status: Secondary | ICD-10-CM | POA: Diagnosis not present

## 2011-03-17 DIAGNOSIS — R509 Fever, unspecified: Secondary | ICD-10-CM | POA: Diagnosis not present

## 2011-03-17 DIAGNOSIS — G971 Other reaction to spinal and lumbar puncture: Secondary | ICD-10-CM | POA: Diagnosis present

## 2011-03-17 DIAGNOSIS — D696 Thrombocytopenia, unspecified: Secondary | ICD-10-CM | POA: Diagnosis present

## 2011-03-17 DIAGNOSIS — R918 Other nonspecific abnormal finding of lung field: Secondary | ICD-10-CM | POA: Diagnosis not present

## 2011-03-17 DIAGNOSIS — I498 Other specified cardiac arrhythmias: Secondary | ICD-10-CM | POA: Diagnosis not present

## 2011-03-17 DIAGNOSIS — Z930 Tracheostomy status: Secondary | ICD-10-CM

## 2011-03-17 DIAGNOSIS — J984 Other disorders of lung: Secondary | ICD-10-CM | POA: Diagnosis not present

## 2011-03-17 DIAGNOSIS — K560 Paralytic ileus: Secondary | ICD-10-CM | POA: Diagnosis present

## 2011-03-17 DIAGNOSIS — R197 Diarrhea, unspecified: Secondary | ICD-10-CM | POA: Diagnosis not present

## 2011-03-17 DIAGNOSIS — Z09 Encounter for follow-up examination after completed treatment for conditions other than malignant neoplasm: Secondary | ICD-10-CM | POA: Diagnosis not present

## 2011-03-17 DIAGNOSIS — J159 Unspecified bacterial pneumonia: Secondary | ICD-10-CM | POA: Diagnosis not present

## 2011-03-17 DIAGNOSIS — J811 Chronic pulmonary edema: Secondary | ICD-10-CM | POA: Diagnosis not present

## 2011-03-17 DIAGNOSIS — R143 Flatulence: Secondary | ICD-10-CM | POA: Diagnosis not present

## 2011-03-17 DIAGNOSIS — Z430 Encounter for attention to tracheostomy: Secondary | ICD-10-CM | POA: Diagnosis not present

## 2011-03-17 DIAGNOSIS — J9600 Acute respiratory failure, unspecified whether with hypoxia or hypercapnia: Secondary | ICD-10-CM | POA: Diagnosis not present

## 2011-03-17 DIAGNOSIS — G40309 Generalized idiopathic epilepsy and epileptic syndromes, not intractable, without status epilepticus: Secondary | ICD-10-CM | POA: Diagnosis not present

## 2011-03-17 DIAGNOSIS — K219 Gastro-esophageal reflux disease without esophagitis: Secondary | ICD-10-CM | POA: Diagnosis present

## 2011-03-17 DIAGNOSIS — J9601 Acute respiratory failure with hypoxia: Secondary | ICD-10-CM

## 2011-03-17 DIAGNOSIS — R0602 Shortness of breath: Secondary | ICD-10-CM | POA: Diagnosis not present

## 2011-03-17 DIAGNOSIS — J151 Pneumonia due to Pseudomonas: Secondary | ICD-10-CM | POA: Diagnosis present

## 2011-03-17 HISTORY — DX: Gastro-esophageal reflux disease with esophagitis: K21.0

## 2011-03-17 HISTORY — DX: Gastro-esophageal reflux disease with esophagitis, without bleeding: K21.00

## 2011-03-17 HISTORY — DX: Epilepsy, unspecified, not intractable, without status epilepticus: G40.909

## 2011-03-17 HISTORY — DX: Ileus, unspecified: K56.7

## 2011-03-17 HISTORY — DX: Cerebral palsy, unspecified: G80.9

## 2011-03-17 LAB — MRSA PCR SCREENING: MRSA by PCR: POSITIVE — AB

## 2011-03-17 LAB — DIFFERENTIAL
Basophils Relative: 0 % (ref 0–1)
Eosinophils Absolute: 0 10*3/uL (ref 0.0–0.7)
Monocytes Relative: 18 % — ABNORMAL HIGH (ref 3–12)
Neutrophils Relative %: 51 % (ref 43–77)

## 2011-03-17 LAB — COMPREHENSIVE METABOLIC PANEL
Albumin: 2.8 g/dL — ABNORMAL LOW (ref 3.5–5.2)
BUN: 6 mg/dL (ref 6–23)
Calcium: 8.3 mg/dL — ABNORMAL LOW (ref 8.4–10.5)
Creatinine, Ser: 0.33 mg/dL — ABNORMAL LOW (ref 0.50–1.10)
Potassium: 3.9 mEq/L (ref 3.5–5.1)
Total Protein: 6.1 g/dL (ref 6.0–8.3)

## 2011-03-17 LAB — CBC
MCH: 26.8 pg (ref 26.0–34.0)
MCHC: 32.6 g/dL (ref 30.0–36.0)
Platelets: 76 10*3/uL — ABNORMAL LOW (ref 150–400)

## 2011-03-17 LAB — CARDIAC PANEL(CRET KIN+CKTOT+MB+TROPI)
CK, MB: 1.9 ng/mL (ref 0.3–4.0)
Relative Index: INVALID (ref 0.0–2.5)
Troponin I: <0.3 ng/mL (ref <0.30)

## 2011-03-17 LAB — EXPECTORATED SPUTUM ASSESSMENT W GRAM STAIN, RFLX TO RESP C

## 2011-03-17 MED ORDER — MOXIFLOXACIN HCL IN NACL 400 MG/250ML IV SOLN
400.0000 mg | Freq: Once | INTRAVENOUS | Status: AC
  Administered 2011-03-17: 400 mg via INTRAVENOUS
  Filled 2011-03-17: qty 250

## 2011-03-17 MED ORDER — VALPROIC ACID 250 MG/5ML PO SYRP
625.0000 mg | ORAL_SOLUTION | Freq: Three times a day (TID) | ORAL | Status: DC
  Administered 2011-03-17 – 2011-04-04 (×54): 625 mg
  Filled 2011-03-17 (×5): qty 15
  Filled 2011-03-17 (×2): qty 12.5
  Filled 2011-03-17 (×2): qty 15
  Filled 2011-03-17 (×3): qty 12.5
  Filled 2011-03-17 (×3): qty 15
  Filled 2011-03-17: qty 12.5
  Filled 2011-03-17 (×3): qty 15
  Filled 2011-03-17 (×7): qty 12.5
  Filled 2011-03-17 (×9): qty 15
  Filled 2011-03-17: qty 12.5
  Filled 2011-03-17: qty 15
  Filled 2011-03-17 (×3): qty 12.5
  Filled 2011-03-17: qty 15
  Filled 2011-03-17 (×8): qty 12.5
  Filled 2011-03-17: qty 15
  Filled 2011-03-17: qty 12.5
  Filled 2011-03-17: qty 15
  Filled 2011-03-17 (×2): qty 12.5
  Filled 2011-03-17 (×2): qty 15
  Filled 2011-03-17: qty 12.5
  Filled 2011-03-17 (×4): qty 15
  Filled 2011-03-17 (×2): qty 12.5
  Filled 2011-03-17: qty 15
  Filled 2011-03-17 (×2): qty 12.5
  Filled 2011-03-17 (×4): qty 15

## 2011-03-17 MED ORDER — ACYCLOVIR 5 % EX OINT
TOPICAL_OINTMENT | CUTANEOUS | Status: AC
  Filled 2011-03-17: qty 30

## 2011-03-17 MED ORDER — CARBAMAZEPINE 100 MG PO CHEW
200.0000 mg | CHEWABLE_TABLET | Freq: Every day | ORAL | Status: DC
  Filled 2011-03-17: qty 2

## 2011-03-17 MED ORDER — DIAZEPAM 2.5 MG RE GEL: 5.0000 mg | RECTAL | Status: DC | PRN

## 2011-03-17 MED ORDER — SODIUM CHLORIDE 0.9 % IV SOLN
INTRAVENOUS | Status: DC
  Administered 2011-03-17: 15:00:00 via INTRAVENOUS
  Administered 2011-03-18 (×2): 50 mL/h via INTRAVENOUS
  Administered 2011-03-21: 03:00:00 via INTRAVENOUS
  Administered 2011-03-22 (×2): 50 mL/h via INTRAVENOUS

## 2011-03-17 MED ORDER — BACLOFEN 10 MG PO TABS
20.0000 mg | ORAL_TABLET | Freq: Four times a day (QID) | ORAL | Status: DC
  Administered 2011-03-17: 20 mg
  Filled 2011-03-17 (×4): qty 2

## 2011-03-17 MED ORDER — POLYETHYLENE GLYCOL 3350 17 G PO PACK
17.0000 g | PACK | Freq: Every day | ORAL | Status: DC
  Administered 2011-03-18 – 2011-04-04 (×15): 17 g
  Filled 2011-03-17 (×21): qty 1

## 2011-03-17 MED ORDER — CHLORHEXIDINE GLUCONATE CLOTH 2 % EX PADS
6.0000 | MEDICATED_PAD | Freq: Every day | CUTANEOUS | Status: AC
  Administered 2011-03-18 – 2011-03-22 (×5): 6 via TOPICAL

## 2011-03-17 MED ORDER — HEPARIN SODIUM (PORCINE) 5000 UNIT/ML IJ SOLN
5000.0000 [IU] | Freq: Three times a day (TID) | INTRAMUSCULAR | Status: DC
  Administered 2011-03-17 (×2): 5000 [IU] via SUBCUTANEOUS
  Filled 2011-03-17 (×2): qty 1

## 2011-03-17 MED ORDER — CARBAMAZEPINE 100 MG PO CHEW
200.0000 mg | CHEWABLE_TABLET | Freq: Every day | ORAL | Status: DC
  Administered 2011-03-17: 200 mg via ORAL
  Filled 2011-03-17 (×2): qty 2

## 2011-03-17 MED ORDER — ZOLPIDEM TARTRATE 5 MG PO TABS
5.0000 mg | ORAL_TABLET | Freq: Every evening | ORAL | Status: DC | PRN
  Administered 2011-03-17 – 2011-03-20 (×4): 5 mg
  Filled 2011-03-17 (×4): qty 1

## 2011-03-17 MED ORDER — BACLOFEN 20 MG PO TABS
20.0000 mg | ORAL_TABLET | Freq: Four times a day (QID) | ORAL | Status: DC
  Filled 2011-03-17 (×4): qty 1

## 2011-03-17 MED ORDER — ACYCLOVIR 5 % EX OINT
TOPICAL_OINTMENT | Freq: Four times a day (QID) | CUTANEOUS | Status: DC
  Administered 2011-03-17: 21:00:00 via TOPICAL
  Filled 2011-03-17: qty 30

## 2011-03-17 MED ORDER — IBUPROFEN 100 MG/5ML PO SUSP
250.0000 mg | ORAL | Status: DC | PRN
  Administered 2011-03-18 – 2011-03-19 (×4): 250 mg
  Filled 2011-03-17 (×4): qty 15

## 2011-03-17 MED ORDER — CARBAMAZEPINE 100 MG PO CHEW: 200.0000 mg | CHEWABLE_TABLET | Freq: Three times a day (TID) | ORAL | Status: DC

## 2011-03-17 MED ORDER — BACLOFEN 20 MG PO TABS
20.0000 mg | ORAL_TABLET | Freq: Four times a day (QID) | ORAL | Status: DC
  Administered 2011-03-17 – 2011-04-04 (×70): 20 mg
  Filled 2011-03-17 (×82): qty 1

## 2011-03-17 MED ORDER — LEVALBUTEROL HCL 0.63 MG/3ML IN NEBU
0.6300 mg | INHALATION_SOLUTION | RESPIRATORY_TRACT | Status: DC
  Administered 2011-03-18 – 2011-03-19 (×8): 0.63 mg via RESPIRATORY_TRACT
  Filled 2011-03-17 (×13): qty 3

## 2011-03-17 MED ORDER — PANTOPRAZOLE SODIUM 40 MG PO PACK
40.0000 mg | PACK | Freq: Every day | ORAL | Status: DC
  Administered 2011-03-18 – 2011-04-04 (×18): 40 mg
  Filled 2011-03-17 (×22): qty 20

## 2011-03-17 MED ORDER — BIOTENE DRY MOUTH MT LIQD: 15.0000 mL | OROMUCOSAL | Status: DC | PRN

## 2011-03-17 MED ORDER — ALPRAZOLAM 0.5 MG PO TABS: 0.5000 mg | ORAL_TABLET | Freq: Every evening | ORAL | Status: DC | PRN

## 2011-03-17 MED ORDER — HYDROCORTISONE 1 % EX CREA
1.0000 "application " | TOPICAL_CREAM | Freq: Three times a day (TID) | CUTANEOUS | Status: DC | PRN
  Filled 2011-03-17: qty 28

## 2011-03-17 MED ORDER — CARBAMAZEPINE 100 MG PO CHEW
300.0000 mg | CHEWABLE_TABLET | ORAL | Status: DC
  Administered 2011-03-17 – 2011-04-04 (×36): 300 mg via ORAL
  Filled 2011-03-17 (×48): qty 3

## 2011-03-17 MED ORDER — PANTOPRAZOLE SODIUM 40 MG PO PACK
40.0000 mg | PACK | Freq: Every day | ORAL | Status: DC
  Filled 2011-03-17: qty 20

## 2011-03-17 MED ORDER — VANCOMYCIN HCL IN DEXTROSE 1-5 GM/200ML-% IV SOLN
INTRAVENOUS | Status: AC
  Filled 2011-03-17: qty 200

## 2011-03-17 MED ORDER — MELATONIN 1 MG PO TABS: 1.0000 | ORAL_TABLET | Freq: Every day | ORAL | Status: DC

## 2011-03-17 MED ORDER — MUPIROCIN 2 % EX OINT
1.0000 | TOPICAL_OINTMENT | Freq: Two times a day (BID) | CUTANEOUS | Status: AC
Start: 2011-03-17 — End: 2011-03-22
  Administered 2011-03-17 – 2011-03-22 (×10): 1 via NASAL
  Filled 2011-03-17: qty 22

## 2011-03-17 MED ORDER — ENSURE IMMUNE HEALTH PO LIQD
237.0000 mL | Freq: Four times a day (QID) | ORAL | Status: DC
  Administered 2011-03-17: 20:00:00
  Administered 2011-03-18 (×3): 237 mL

## 2011-03-17 MED ORDER — GLYCOPYRROLATE 1 MG PO TABS
2.5000 mg | ORAL_TABLET | Freq: Two times a day (BID) | ORAL | Status: DC
  Filled 2011-03-17 (×3): qty 3

## 2011-03-17 MED ORDER — CARBAMAZEPINE 100 MG PO CHEW
200.0000 mg | CHEWABLE_TABLET | Freq: Every day | ORAL | Status: DC
  Administered 2011-03-18 – 2011-04-04 (×19): 200 mg via ORAL
  Filled 2011-03-17 (×21): qty 2

## 2011-03-17 MED ORDER — GLYCOPYRROLATE 1 MG PO TABS
2.5000 mg | ORAL_TABLET | Freq: Two times a day (BID) | ORAL | Status: DC
  Administered 2011-03-18 – 2011-03-21 (×7): 2.5 mg
  Filled 2011-03-17 (×14): qty 3

## 2011-03-17 MED ORDER — CHLORHEXIDINE GLUCONATE 0.12 % MT SOLN
15.0000 mL | Freq: Four times a day (QID) | OROMUCOSAL | Status: DC
  Administered 2011-03-17 – 2011-03-21 (×15): 15 mL via OROMUCOSAL
  Filled 2011-03-17 (×10): qty 15

## 2011-03-17 MED ORDER — SODIUM CHLORIDE 0.9 % IV BOLUS (SEPSIS)
500.0000 mL | Freq: Once | INTRAVENOUS | Status: AC
  Administered 2011-03-17: 500 mL via INTRAVENOUS

## 2011-03-17 MED ORDER — LEVOFLOXACIN IN D5W 500 MG/100ML IV SOLN
500.0000 mg | INTRAVENOUS | Status: DC
  Administered 2011-03-17: 500 mg via INTRAVENOUS
  Filled 2011-03-17: qty 100

## 2011-03-17 MED ORDER — FREE WATER
50.0000 mL | Freq: Two times a day (BID) | Status: DC
  Administered 2011-03-17 – 2011-03-18 (×2): 50 mL

## 2011-03-17 MED ORDER — CARBAMAZEPINE 100 MG PO CHEW
300.0000 mg | CHEWABLE_TABLET | ORAL | Status: DC
  Filled 2011-03-17 (×2): qty 3

## 2011-03-17 MED ORDER — FLEET ENEMA 7-19 GM/118ML RE ENEM
1.0000 | ENEMA | Freq: Every day | RECTAL | Status: DC | PRN
  Filled 2011-03-17: qty 1

## 2011-03-17 MED ORDER — POLYETHYLENE GLYCOL 3350 17 G PO PACK
17.0000 g | PACK | Freq: Every day | ORAL | Status: DC
  Administered 2011-03-17: 17 g
  Filled 2011-03-17: qty 1

## 2011-03-17 MED ORDER — VANCOMYCIN HCL IN DEXTROSE 1-5 GM/200ML-% IV SOLN
1000.0000 mg | Freq: Once | INTRAVENOUS | Status: AC
  Administered 2011-03-17: 1000 mg via INTRAVENOUS
  Filled 2011-03-17: qty 200

## 2011-03-17 MED ORDER — NYSTATIN 100000 UNIT/GM EX OINT
1.0000 "application " | TOPICAL_OINTMENT | Freq: Three times a day (TID) | CUTANEOUS | Status: DC | PRN
  Administered 2011-03-24 – 2011-04-03 (×5): 1 via TOPICAL
  Filled 2011-03-17 (×5): qty 15

## 2011-03-17 MED ORDER — PANTOPRAZOLE SODIUM 40 MG PO PACK
40.0000 mg | PACK | Freq: Every day | ORAL | Status: DC
  Filled 2011-03-17 (×2): qty 20

## 2011-03-17 MED ORDER — SODIUM CHLORIDE 0.9 % IV BOLUS (SEPSIS)
1000.0000 mL | Freq: Once | INTRAVENOUS | Status: AC
  Administered 2011-03-17: 1000 mL via INTRAVENOUS

## 2011-03-17 MED ORDER — DEXTROSE 5 % IV SOLN
500.0000 mg | INTRAVENOUS | Status: DC
  Administered 2011-03-17: 500 mg via INTRAVENOUS
  Filled 2011-03-17: qty 500

## 2011-03-17 MED ORDER — POLYETHYLENE GLYCOL 3350 17 G PO PACK: 17.0000 g | PACK | Freq: Every day | ORAL | Status: DC

## 2011-03-17 MED ORDER — FREE WATER
100.0000 mL | Freq: Four times a day (QID) | Status: DC
  Administered 2011-03-17 – 2011-03-22 (×17): 100 mL

## 2011-03-17 MED ORDER — LEVOFLOXACIN IN D5W 750 MG/150ML IV SOLN
750.0000 mg | INTRAVENOUS | Status: DC
  Administered 2011-03-18 – 2011-03-23 (×6): 750 mg via INTRAVENOUS
  Filled 2011-03-17 (×11): qty 150

## 2011-03-17 MED ORDER — FLUTICASONE PROPIONATE 50 MCG/ACT NA SUSP
2.0000 | Freq: Every day | NASAL | Status: DC
  Administered 2011-03-18 – 2011-03-21 (×4): 2 via NASAL
  Filled 2011-03-17: qty 16

## 2011-03-17 MED ORDER — ALBUTEROL SULFATE (5 MG/ML) 0.5% IN NEBU
2.5000 mg | INHALATION_SOLUTION | RESPIRATORY_TRACT | Status: DC
  Administered 2011-03-17: 2.5 mg via RESPIRATORY_TRACT
  Filled 2011-03-17: qty 0.5

## 2011-03-17 MED ORDER — SILVER SULFADIAZINE 1 % EX CREA
1.0000 "application " | TOPICAL_CREAM | Freq: Every day | CUTANEOUS | Status: DC
  Administered 2011-03-17 – 2011-04-04 (×19): 1 via TOPICAL
  Filled 2011-03-17 (×2): qty 85

## 2011-03-17 MED ORDER — SORBITOL 70 % SOLN
30.0000 mL | Freq: Every day | Status: DC | PRN
  Filled 2011-03-17: qty 30

## 2011-03-17 MED ORDER — BACLOFEN 10 MG PO TABS
ORAL_TABLET | ORAL | Status: AC
  Filled 2011-03-17: qty 2

## 2011-03-17 MED ORDER — ALBUTEROL SULFATE (5 MG/ML) 0.5% IN NEBU: 2.5000 mg | INHALATION_SOLUTION | RESPIRATORY_TRACT | Status: DC | PRN

## 2011-03-17 MED ORDER — VALPROIC ACID 250 MG/5ML PO SYRP
625.0000 mg | ORAL_SOLUTION | Freq: Three times a day (TID) | ORAL | Status: DC
  Administered 2011-03-17: 625 mg
  Filled 2011-03-17 (×5): qty 15

## 2011-03-17 MED ORDER — MUPIROCIN 2 % EX OINT
1.0000 "application " | TOPICAL_OINTMENT | Freq: Two times a day (BID) | CUTANEOUS | Status: DC | PRN
  Administered 2011-03-17 – 2011-03-25 (×2): 1 via TOPICAL
  Filled 2011-03-17: qty 22

## 2011-03-17 MED ORDER — VANCOMYCIN HCL 1000 MG IV SOLR
750.0000 mg | Freq: Three times a day (TID) | INTRAVENOUS | Status: DC
  Administered 2011-03-18 – 2011-03-20 (×5): 750 mg via INTRAVENOUS
  Filled 2011-03-17 (×16): qty 750

## 2011-03-17 MED ORDER — ALBUTEROL SULFATE (5 MG/ML) 0.5% IN NEBU
2.5000 mg | INHALATION_SOLUTION | RESPIRATORY_TRACT | Status: DC | PRN
  Administered 2011-03-17: 2.5 mg via RESPIRATORY_TRACT
  Filled 2011-03-17: qty 0.5

## 2011-03-17 MED ORDER — FLUTICASONE PROPIONATE 50 MCG/ACT NA SUSP
2.0000 | Freq: Every day | NASAL | Status: DC
  Filled 2011-03-17: qty 16

## 2011-03-17 MED ORDER — SODIUM CHLORIDE 0.9 % IV SOLN
Freq: Once | INTRAVENOUS | Status: AC
  Administered 2011-03-17: 10:00:00 via INTRAVENOUS

## 2011-03-17 MED ORDER — ACETAMINOPHEN 500 MG PO TABS
500.0000 mg | ORAL_TABLET | Freq: Four times a day (QID) | ORAL | Status: DC | PRN
  Administered 2011-03-18 – 2011-03-26 (×7): 500 mg
  Filled 2011-03-17 (×7): qty 1

## 2011-03-17 MED ORDER — GUAIFENESIN 100 MG/5ML PO SOLN
10.0000 mL | Freq: Four times a day (QID) | ORAL | Status: DC | PRN
  Filled 2011-03-17: qty 15

## 2011-03-17 NOTE — ED Notes (Signed)
Mom to the Desk and states,"Pt needs suctioning".  Nurses informed.  Also  Informed Lab Tech,per Dr. Estell Harpin to try to get  atleast a CBC on the Pt.

## 2011-03-17 NOTE — ED Notes (Signed)
Per Felisse(Lab Tech), Pts mom refuses the lab work.

## 2011-03-17 NOTE — ED Provider Notes (Signed)
History   This chart was scribed for Benny Lennert, MD scribed by Magnus Sinning. The patient was seen in room APA14/APA14 seen at 9:48.    CSN: [id number]  Arrival date & time 03/17/11  0913   First MD Initiated Contact with Patient 03/17/11 [phone]      Chief Complaint  Patient presents with  . Fever    (Consider location/radiation/quality/duration/timing/severity/associated sxs/prior Treatment) [name] is a 26 y.o. female BIB EMS Patient is a 26 y.o. female presenting with fever. The history is provided by a caregiver. No language interpreter was used.  Fever Primary symptoms of the febrile illness include fever (100.7) and wheezing. Primary symptoms do not include cough. The current episode started yesterday. This is a new problem. The problem has not changed since onset. The fever began yesterday. The fever has been unchanged since its onset. The maximum temperature recorded prior to her arrival was 100 to 100.9 F. The temperature was taken by a tympanic thermometer.  Wheezing began yesterday. Wheezing occurs continuously. The wheezing has been unchanged since its onset. The wheezing had no precipitant. The patient's medical history does not include asthma.  Pt uses 2L oxygen at night at home. PCP: Dr. Gerda Diss Past Medical History  Diagnosis Date  . Cerebral palsy   . Epilepsy     Past Surgical History  Procedure Date  . Gastrostomy w/ feeding tube     No family history on file.  History  Substance Use Topics  . Smoking status: Not on file  . Smokeless tobacco: Not on file  . Alcohol Use:    Review of Systems  Constitutional: Positive for fever (100.7).  HENT: Positive for congestion.   Respiratory: Positive for wheezing. Negative for cough.   All other systems reviewed and are negative.    Allergies  Ceftazidime; Fortaz; and Morphine and related  Home Medications  No current outpatient prescriptions on file.  BP 107/43  Pulse 115  Resp 20  Wt 90 lb  9.6 oz (41.096 kg)  SpO2 93%  LMP 02/21/2011  Physical Exam  Constitutional:       Pt has cp and is very small for her age.  She is bed ridden and has a trach  HENT:  Head: Normocephalic.       trach  Eyes: Conjunctivae and EOM are normal. No scleral icterus.  Neck: Neck supple. No thyromegaly present.  Cardiovascular: Normal heart sounds.  Exam reveals no gallop and no friction rub.   No murmur heard.      tachycardia  Pulmonary/Chest: No stridor. She has wheezes. She has rales. She exhibits no tenderness.  Abdominal: She exhibits no distension. There is no tenderness. There is no rebound.  Musculoskeletal: She exhibits no edema.  Lymphadenopathy:    She has no cervical adenopathy.  Neurological: She is alert.       Pt cannot speak or follow commands.  This is her normal  Skin: No rash noted. No erythema.  Psychiatric:       Pt awake but cannot speak.    ED Course  Procedures (including critical care time) DIAGNOSTIC STUDIES: Oxygen Saturation is 93% on room air, normal by my interpretation.    COORDINATION OF CARE:  Procedural Note 10:58: Physician performs femoral stick in right femoral vein for blood draw.  20 cc of blood drawn.  Pt tolerated procedure well 12:29: Physician discusses lab results with family and answers questions. He informs them of intent to admit pt and plans to  contact Dr. Gerda Diss. Family agrees with plan of action set at this time. 13:13: Physician speaks with admitting physician.  Labs Reviewed  COMPREHENSIVE METABOLIC PANEL - Abnormal; Notable for the following:    Sodium 128 (*)    Chloride 92 (*)    Creatinine, Ser 0.33 (*)    Calcium 8.3 (*)    Albumin 2.8 (*)    Total Bilirubin 0.1 (*)    All other components within normal limits  CBC - Abnormal; Notable for the following:    Hemoglobin 10.4 (*)    HCT 31.9 (*)    Platelets 76 (*)    All other components within normal limits  DIFFERENTIAL - Abnormal; Notable for the following:     Monocytes Relative 18 (*)    Monocytes Absolute 1.3 (*)    All other components within normal limits  LACTIC ACID, PLASMA  CULTURE, BLOOD (ROUTINE X 2)   Dg Chest Port 1 View  03/17/2011  *RADIOLOGY REPORT*  Clinical Data: Congestion, shortness of breath  PORTABLE CHEST - 1 VIEW  Comparison: August 26, 2008  Findings: Tracheostomy remains in place.  Mild cardiomegaly appears unchanged.  Right suprahilar opacity/atelectasis does not appear changed.  Marked elevation of the right hemi diaphragm remains present with gaseous distention of the colon at the hepatic flexure. No pleural effusions.  IMPRESSION: Mild right suprahilar atelectasis/infiltrate.  Original Report Authenticated By: Brandon Melnick, M.D.    No diagnosis found.  CRITICAL CARE Performed by: Roshana Shuffield L   Total critical care time: 35  Critical care time was exclusive of separately billable procedures and treating other patients.  Critical care was necessary to treat or prevent imminent or life-threatening deterioration.  Critical care was time spent personally by me on the following activities: development of treatment plan with patient and/or surrogate as well as nursing, discussions with consultants, evaluation of patient's response to treatment, examination of patient, obtaining history from patient or surrogate, ordering and performing treatments and interventions, ordering and review of laboratory studies, ordering and review of radiographic studies, pulse oximetry and re-evaluation of patient's condition.   MDM  cap The chart was scribed for me under my direct supervision.  I personally performed the history, physical, and medical decision making and all procedures in the evaluation of this patient.Benny Lennert, MD 03/17/11 1340

## 2011-03-17 NOTE — ED Notes (Signed)
Resp paged and came down to perform suctioning to pt trach. Pt tolerated well. Sats maintained throughout suctioning.

## 2011-03-17 NOTE — Progress Notes (Signed)
PHARMACIST - PHYSICIAN ORDER COMMUNICATION  CONCERNING: P&T Medication Policy on Herbal Medications  DESCRIPTION:  This patient's order for:  Metlatonin  has been noted.  This product(s) is classified as an "herbal" or natural product. Due to a lack of definitive safety studies or FDA approval, nonstandard manufacturing practices, plus the potential risk of unknown drug-drug interactions while on inpatient medications, the Pharmacy and Therapeutics Committee does not permit the use of "herbal" or natural products of this type within [facility].   ACTION TAKEN: The pharmacy department is unable to verify this order at this time and your patient has been informed of this safety policy. Please reevaluate patient's clinical condition at discharge and address if the herbal or natural product(s) should be resumed at that time.

## 2011-03-17 NOTE — ED Notes (Signed)
Pt started with fever yesterday and unable to keep fever down with Tylenol at home. Audible congestion present as well on arrival.

## 2011-03-17 NOTE — H&P (Signed)
[name] MRN: [id number] DOB/AGE: 07/15/1985 26 y.o. Primary Care Physician:LUKING,W S, MD, MD Admit date: 03/17/2011 Chief Complaint: Fever, chest congestion. 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 She is receiving total care at home with nurses around-the-clock. She has a tracheostomy and is fed via a G-tube. She has epilepsy. She is nonverbal. When she was evaluated in the emergency room, chest x-ray revealed the presence of a probable pneumonia on the right side. This patient also require suction 3-4 times an hour and I wonder whether some of this is mucus plugging. She has required 40% inspired oxygen to maintain saturations above 90%. She is a full code.  Past Medical History  Diagnosis Date  . Cerebral palsy   . Epilepsy   . Ileus   . Reflux esophagitis    Past Surgical History  Procedure Date  . Gastrostomy w/ feeding tube           Social History: She lives at home and is total care. She does not smoke and does not drink alcohol.   Allergies:  Allergies  Allergen Reactions  . Amoxicillin Other (See Comments)    Yeast Infection   . Morphine And Related Other (See Comments)    Unknown  . Ceftazidime Rash  . Fortaz (Ceftazidime Sodium In D5w) Rash    Medications Prior to Admission  Medication Dose Route Frequency Provider Last Rate Last Dose  . 0.9 %  sodium chloride infusion   Intravenous Once Benny Lennert, MD      . 0.9 %  sodium chloride infusion   Intravenous Continuous Santa Abdelrahman Normajean Glasgow, MD      . acetaminophen (TYLENOL) tablet 500 mg  500 mg Per Tube Q6H PRN Juanya Villavicencio C Marva Hendryx, MD      . albuterol (PROVENTIL) (5 MG/ML) 0.5% nebulizer solution 2.5 mg  2.5 mg Nebulization Q4H PRN Breyona Swander Normajean Glasgow, MD      . ALPRAZolam Prudy Feeler) tablet 0.5 mg  0.5 mg Per Tube QHS PRN Leiya Keesey Normajean Glasgow, MD      . azithromycin (ZITHROMAX) 500 mg in dextrose 5 % 250 mL  IVPB  500 mg Intravenous Q24H Daisie Haft C Margaretann Abate, MD      . baclofen (LIORESAL) 10 mg/mL oral suspension 20 mg  20 mg Per Tube QID Cendy Oconnor Normajean Glasgow, MD      . carbamazepine (TEGRETOL) chewable tablet 200-300 mg  200-300 mg Per Tube TID Brantlee Hinde Normajean Glasgow, MD      . diazepam (DIASTAT) rectal kit 5 mg  5 mg Rectal PRN Alanzo Lamb Normajean Glasgow, MD      . fluticasone (FLONASE) 50 MCG/ACT nasal spray 2 spray  2 spray Each Nare Daily Remedios Mckone C Nathalia Wismer, MD      . glycopyrrolate (ROBINUL) tablet 2.5 mg  2.5 mg Per Tube BID Corda Shutt C Leron Stoffers, MD      . guaifenesin (ROBITUSSIN) 100 MG/5ML syrup 10-15 mL  10-15 mL Per Tube QID PRN Eydan Chianese C Lily Velasquez, MD      . heparin injection 5,000 Units  5,000 Units Subcutaneous Q8H Aerik Polan C Mathias Bogacki, MD      . hydrocortisone 2.5 % cream 1 application  1 application Topical TID PRN Rhiley Tarver C Rydge Texidor, MD      . ibuprofen (ADVIL,MOTRIN) 100 MG/5ML suspension 250 mg  250 mg Per Tube Q4H PRN Karmine Kauer Normajean Glasgow, MD      . levofloxacin (  LEVAQUIN) IVPB 500 mg  500 mg Intravenous Q24H Camille Thau C Burle Kwan, MD      . Melatonin TABS 1 mg  1 tablet Tube QHS Michell Kader C Karinna Beadles, MD      . moxifloxacin (AVELOX) IVPB 400 mg  400 mg Intravenous Once Benny Lennert, MD   400 mg at 03/17/11 1153  . mupirocin ointment (BACTROBAN) 2 % 1 application  1 application Topical BID PRN Kendal Ghazarian Normajean Glasgow, MD      . nystatin ointment (MYCOSTATIN) 1 application  1 application Topical TID PRN Aveah Castell Normajean Glasgow, MD      . pantoprazole sodium (PROTONIX) 40 mg/20 mL oral suspension 40 mg  40 mg Per Tube Daily Clotine Heiner C Maleeya Peterkin, MD      . polyethylene glycol (MIRALAX / GLYCOLAX) packet 17 g  17 g Per Tube Daily Lovis More C Jonavon Trieu, MD      . silver sulfADIAZINE (SILVADENE) 1 % cream 1 application  1 application Topical Daily Daryon Remmert C Delshon Blanchfield, MD      . sodium chloride 0.9 % bolus 1,000 mL  1,000 mL Intravenous Once Benny Lennert, MD   1,000 mL at 03/17/11 1258  . sodium chloride 0.9 % bolus 1,000 mL  1,000 mL Intravenous Once Benny Lennert, MD      . sodium chloride 0.9 % bolus 500 mL  500 mL Intravenous Once Benny Lennert, MD   500 mL at 03/17/11 1057  . sodium phosphate (FLEET) 7-19 GM/118ML enema 1 enema  1 enema Rectal Daily PRN Alyxandria Wentz Normajean Glasgow, MD      . sorbitol 70 % solution 30 mL  30 mL Per Tube Daily PRN Remon Quinto Normajean Glasgow, MD      . valproate (DEPAKENE) 250 MG/5ML syrup 625 mg  625 mg Per Tube TID Marios Gaiser C Karilyn Cota, MD      . zolpidem (AMBIEN) tablet 5 mg  5 mg Per Tube QHS PRN Wilson Singer, MD       No current outpatient prescriptions on file as of 03/17/2011.       ZOX:WRUEA from the symptoms mentioned above,there are no other symptoms referable to all systems reviewed.  Physical Exam: Blood pressure 88/42, pulse 99, temperature 99.7 F (37.6 C), temperature source Rectal, resp. rate 20, weight 41.096 kg (90 lb 9.6 oz), last menstrual period 02/21/2011, SpO2 98.00%. Check she looks systemically well. Her peripheries are well perfused. Heart sounds are present and normal and appear to be in sinus rhythm. Lung fields reveal widespread congestion and some wheezing. There is no focal bronchial breathing. Abdomen is soft and nontender. Neurologically, she has features consistent with chronic and severe cerebral palsy.    Basename 03/17/11 1050  WBC 7.4  NEUTROABS 3.7  HGB 10.4*  HCT 31.9*  MCV 82.2  PLT 76*    Basename 03/17/11 1012  NA 128*  K 3.9  CL 92*  CO2 30  GLUCOSE 92  BUN 6  CREATININE 0.33*  CALCIUM 8.3*  MG --         Dg Chest Port 1 View  03/17/2011  *RADIOLOGY REPORT*  Clinical Data: Congestion, shortness of breath  PORTABLE CHEST - 1 VIEW  Comparison: August 26, 2008  Findings: Tracheostomy remains in place.  Mild cardiomegaly appears unchanged.  Right suprahilar opacity/atelectasis does not appear changed.  Marked elevation of the right hemi diaphragm remains present with gaseous distention of the colon at the hepatic flexure. No pleural effusions.  IMPRESSION: Mild right  suprahilar atelectasis/infiltrate.  Original Report Authenticated By: Brandon Melnick, M.D.   Impression: 1. Community-acquired pneumonia in an immunocompromised patient requiring supplemental oxygen. Patient is allergic to penicillin and ceftazidime. 2. Severe cerebral palsy. 3. Epilepsy associated with cerebral palsy. 4. Hyponatremia secondary to hypovolemia.     Plan: 1. Admit to step down unit. 2. Intravenous antibiotics. 3. Pulmonary toileting. 4. Intravenous fluids. Further recommendations will depend on patient's hospital progress.      Wilson Singer Pager [phone]  03/17/2011, 1:46 PM

## 2011-03-18 ENCOUNTER — Inpatient Hospital Stay (HOSPITAL_COMMUNITY): Payer: Medicare Other

## 2011-03-18 DIAGNOSIS — J9620 Acute and chronic respiratory failure, unspecified whether with hypoxia or hypercapnia: Secondary | ICD-10-CM | POA: Diagnosis present

## 2011-03-18 DIAGNOSIS — J9600 Acute respiratory failure, unspecified whether with hypoxia or hypercapnia: Secondary | ICD-10-CM

## 2011-03-18 DIAGNOSIS — D696 Thrombocytopenia, unspecified: Secondary | ICD-10-CM

## 2011-03-18 DIAGNOSIS — J159 Unspecified bacterial pneumonia: Secondary | ICD-10-CM

## 2011-03-18 DIAGNOSIS — G40309 Generalized idiopathic epilepsy and epileptic syndromes, not intractable, without status epilepticus: Secondary | ICD-10-CM

## 2011-03-18 LAB — COMPREHENSIVE METABOLIC PANEL
AST: 22 U/L (ref 0–37)
BUN: 4 mg/dL — ABNORMAL LOW (ref 6–23)
CO2: 31 mEq/L (ref 19–32)
Calcium: 8.5 mg/dL (ref 8.4–10.5)
Chloride: 98 mEq/L (ref 96–112)
Creatinine, Ser: 0.26 mg/dL — ABNORMAL LOW (ref 0.50–1.10)
GFR calc Af Amer: >90 mL/min (ref >90)
GFR calc non Af Amer: >90 mL/min (ref >90)
Total Bilirubin: 0.1 mg/dL — ABNORMAL LOW (ref 0.3–1.2)

## 2011-03-18 LAB — CBC
Hemoglobin: 10 g/dL — ABNORMAL LOW (ref 12.0–15.0)
Platelets: 63 10*3/uL — ABNORMAL LOW (ref 150–400)
RBC: 3.73 MIL/uL — ABNORMAL LOW (ref 3.87–5.11)
WBC: 5.1 10*3/uL (ref 4.0–10.5)

## 2011-03-18 LAB — POCT I-STAT 3, ART BLOOD GAS (G3+)
pCO2 arterial: 49.6 mmHg — ABNORMAL HIGH (ref 35.0–45.0)
pH, Arterial: 7.422 — ABNORMAL HIGH (ref 7.350–7.400)
pO2, Arterial: 60 mmHg — ABNORMAL LOW (ref 80.0–100.0)

## 2011-03-18 LAB — GLUCOSE, CAPILLARY: Glucose-Capillary: 87 mg/dL (ref 70–99)

## 2011-03-18 MED ORDER — DEXTROSE 5 % IV SOLN
2.0000 g | Freq: Three times a day (TID) | INTRAVENOUS | Status: DC
  Administered 2011-03-18 – 2011-03-19 (×2): 2 g via INTRAVENOUS
  Filled 2011-03-18 (×4): qty 2

## 2011-03-18 MED ORDER — METRONIDAZOLE IN NACL 5-0.79 MG/ML-% IV SOLN
500.0000 mg | Freq: Three times a day (TID) | INTRAVENOUS | Status: DC
  Administered 2011-03-18 – 2011-03-21 (×9): 500 mg via INTRAVENOUS
  Filled 2011-03-18 (×12): qty 100

## 2011-03-18 MED ORDER — SODIUM CHLORIDE 0.9 % IJ SOLN
10.0000 mL | INTRAMUSCULAR | Status: DC | PRN
  Administered 2011-03-21 – 2011-03-26 (×2): 10 mL
  Administered 2011-03-26 – 2011-03-30 (×4): 20 mL
  Administered 2011-03-31 (×2): 10 mL
  Administered 2011-04-03: 20 mL
  Filled 2011-03-18: qty 20
  Filled 2011-03-18: qty 30
  Filled 2011-03-18: qty 10
  Filled 2011-03-18: qty 20
  Filled 2011-03-18: qty 30
  Filled 2011-03-18: qty 20
  Filled 2011-03-18: qty 10
  Filled 2011-03-18: qty 30
  Filled 2011-03-18: qty 10
  Filled 2011-03-18 (×2): qty 20

## 2011-03-18 MED ORDER — ALBUTEROL SULFATE HFA 108 (90 BASE) MCG/ACT IN AERS
4.0000 | INHALATION_SPRAY | RESPIRATORY_TRACT | Status: DC | PRN
  Filled 2011-03-18: qty 6.7

## 2011-03-18 MED ORDER — SODIUM CHLORIDE 0.9 % IV SOLN: 250.0000 mL | INTRAVENOUS | Status: DC | PRN

## 2011-03-18 MED ORDER — SODIUM CHLORIDE 0.9 % IV SOLN: INTRAVENOUS | Status: DC

## 2011-03-18 MED ORDER — LIDOCAINE VISCOUS 2 % MT SOLN
20.0000 mL | Freq: Once | OROMUCOSAL | Status: AC
  Administered 2011-03-18: 15 mL via OROMUCOSAL
  Filled 2011-03-18: qty 20

## 2011-03-18 MED ORDER — CLINDAMYCIN PHOSPHATE 600 MG/50ML IV SOLN
600.0000 mg | Freq: Three times a day (TID) | INTRAVENOUS | Status: DC
  Administered 2011-03-18: 600 mg via INTRAVENOUS
  Filled 2011-03-18 (×10): qty 50

## 2011-03-18 MED ORDER — ACYCLOVIR 5 % EX CREA
TOPICAL_CREAM | Freq: Four times a day (QID) | CUTANEOUS | Status: DC
  Administered 2011-03-18: 22:00:00 via TOPICAL
  Administered 2011-03-18: 1 via TOPICAL
  Administered 2011-03-18: 19:00:00 via TOPICAL
  Administered 2011-03-18: 1 via TOPICAL
  Administered 2011-03-19 – 2011-03-28 (×39): via TOPICAL
  Administered 2011-03-28: 1 via TOPICAL
  Administered 2011-03-29 – 2011-04-03 (×19): via TOPICAL
  Administered 2011-04-03: 1 via TOPICAL
  Administered 2011-04-04 (×2): via TOPICAL
  Filled 2011-03-18: qty 5

## 2011-03-18 NOTE — Consult Note (Signed)
  Asked to see about upsizing the tracheostomy. She has a 5.5 pediatric Shiley. She has pneumonia now. The trach has been in place for about 7 years.   The trach was removed. I was able to easily place a standard Shiley uncuffed #4. She was able to ventilate easier. I attempted to replace that with a #4 cuffed but was unable to pass it.  IF there is any trouble with this tube, I will need to take her to the OR to revise the trach.

## 2011-03-18 NOTE — Progress Notes (Signed)
ANTIBIOTIC CONSULT NOTE - INITIAL  Pharmacy Consult for Vancomycin Indication: pneumonia - Immunocomprimised  Allergies  Allergen Reactions  . Amoxicillin Other (See Comments)    Yeast Infection   . Morphine And Related Other (See Comments)    Unknown  . Ceftazidime Rash  . Fortaz (Ceftazidime Sodium In D5w) Rash    Patient Measurements: Height: 4' (121.9 cm) Weight: 101 lb 10.1 oz (46.1 kg) IBW/kg (Calculated) : 17.9   Vital Signs: Temp: 99 F (37.2 C) (03/01 0600) Temp src: Oral (03/01 0400) BP: 115/65 mmHg (03/01 0600) Pulse Rate: 113  (03/01 0600) Intake/Output from previous day: 02/28 0701 - 03/01 0700 In: 2187 [I.V.:700; IV Piggyback:550] Out: -  Intake/Output from this shift:    Labs:  Basename 03/18/11 0700 03/17/11 1050 03/17/11 1012  WBC 5.1 7.4 --  HGB 10.0* 10.4* --  PLT 63* 76* --  LABCREA -- -- --  CREATININE 0.26* -- 0.33*   Estimated Creatinine Clearance: 48.7 ml/min (by C-G formula based on Cr of 0.26). No results found for this basename: VANCOTROUGH:2,VANCOPEAK:2,VANCORANDOM:2,GENTTROUGH:2,GENTPEAK:2,GENTRANDOM:2,TOBRATROUGH:2,TOBRAPEAK:2,TOBRARND:2,AMIKACINPEAK:2,AMIKACINTROU:2,AMIKACIN:2, in the last 72 hours   Microbiology: Recent Results (from the past 720 hour(s))  CULTURE, BLOOD (ROUTINE X 2)     Status: Normal (Preliminary result)   Collection Time   03/17/11 10:16 AM      Component Value Range Status Comment   Specimen Description BLOOD FEMORAL ARTERY COLLECTED BY DOCTOR   Final    Special Requests BOTTLES DRAWN AEROBIC AND ANAEROBIC 5CC   Final    Culture NO GROWTH <24 HRS   Final    Report Status PENDING   Incomplete   MRSA PCR SCREENING     Status: Abnormal   Collection Time   03/17/11  2:28 PM      Component Value Range Status Comment   MRSA by PCR POSITIVE (*) NEGATIVE  Final   CULTURE, BLOOD (ROUTINE X 2)     Status: Normal (Preliminary result)   Collection Time   03/17/11  4:00 PM      Component Value Range Status Comment   Specimen Description BLOOD RIGHT HAND   Final    Special Requests BOTTLES DRAWN AEROBIC AND ANAEROBIC 6CC   Final    Culture PENDING   Incomplete    Report Status PENDING   Incomplete   CULTURE, SPUTUM-ASSESSMENT     Status: Normal   Collection Time   03/17/11  7:06 PM      Component Value Range Status Comment   Specimen Description TRACHEAL ASPIRATE   Final    Special Requests NONE   Final    Sputum evaluation     Final    Value: THIS SPECIMEN IS ACCEPTABLE. RESPIRATORY CULTURE REPORT TO FOLLOW.     Performed at [facility]   Report Status 03/17/2011 FINAL   Final   CULTURE, RESPIRATORY     Status: Normal (Preliminary result)   Collection Time   03/17/11  7:06 PM      Component Value Range Status Comment   Specimen Description TRACHEAL ASPIRATE   Final    Special Requests NONE   Final    Gram Stain     Final    Value: ABUNDANT WBC PRESENT, PREDOMINANTLY PMN     RARE SQUAMOUS EPITHELIAL CELLS PRESENT     FEW GRAM POSITIVE COCCI IN PAIRS FEW GRAM NEGATIVE RODS RARE GRAM POSITIVE RODS     Gram Stain Report Called to,Read Back By and Verified With: TOLER,M. AT 2020 ON 03/17/2011 BY BAUGHAM,M.  Performed at [facility]   Culture PENDING   Incomplete    Report Status PENDING   Incomplete     Medical History: Past Medical History  Diagnosis Date  . Cerebral palsy   . Epilepsy   . Ileus   . Reflux esophagitis     Medications:  Anti-infectives     Start     Dose/Rate Route Frequency Ordered Stop   03/18/11 1400   Levofloxacin (LEVAQUIN) IVPB 750 mg        750 mg 100 mL/hr over 90 Minutes Intravenous Every 24 hours 03/17/11 2201     03/18/11 0800   vancomycin (VANCOCIN) 750 mg in sodium chloride 0.9 % 150 mL IVPB        750 mg 150 mL/hr over 60 Minutes Intravenous Every 8 hours 03/17/11 2211     03/17/11 2300   vancomycin (VANCOCIN) IVPB 1000 mg/200 mL premix        1,000 mg 200 mL/hr over 60 Minutes Intravenous  Once 03/17/11 2211 03/18/11 0005    03/17/11 1400   levofloxacin (LEVAQUIN) IVPB 500 mg  Status:  Discontinued        500 mg 100 mL/hr over 60 Minutes Intravenous Every 24 hours 03/17/11 1346 03/17/11 2201   03/17/11 1345   azithromycin (ZITHROMAX) 500 mg in dextrose 5 % 250 mL IVPB  Status:  Discontinued        500 mg 250 mL/hr over 60 Minutes Intravenous Every 24 hours 03/17/11 1345 03/17/11 2201   03/17/11 1145   moxifloxacin (AVELOX) IVPB 400 mg        400 mg 250 mL/hr over 60 Minutes Intravenous  Once 03/17/11 1142 03/17/11 1257         Assessment: Okay for Protocol  Goal of Therapy:  Vancomycin trough level 15-20 mcg/ml  Plan:  Measure antibiotic drug levels at steady state Follow up culture results Vancomycin 1000mg  IV x 1, then 750mg  IV every 8 hours. Trough in AM. Mady Gemma 03/18/2011,8:19 AM

## 2011-03-18 NOTE — Progress Notes (Signed)
ANTIBIOTIC CONSULT NOTE - INITIAL  Pharmacy Consult for aztreonam, vancomycin  Indication: CAP  Allergies  Allergen Reactions  . Amoxicillin Other (See Comments)    Yeast Infection   . Morphine And Related Other (See Comments)    Unknown  . Ceftazidime Rash  . Fortaz (Ceftazidime Sodium In D5w) Rash    Patient Measurements: Height: 4' (121.9 cm) Weight: 101 lb 10.1 oz (46.1 kg) IBW/kg (Calculated) : 17.9  Adjusted Body Weight:  Vital Signs: Temp: 102.4 F (39.1 C) (03/01 1715) Temp src: Axillary (03/01 1715) BP: 116/63 mmHg (03/01 1800) Pulse Rate: 133  (03/01 1800) Intake/Output from previous day: 02/28 0701 - 03/01 0700 In: 2187 [I.V.:700; IV Piggyback:550] Out: -  Intake/Output from this shift: Total I/O In: 1324 [I.V.:500; ZOXWR:604; IV Piggyback:350] Out: -   Labs:  Basename 03/18/11 0700 03/17/11 1050 03/17/11 1012  WBC 5.1 7.4 --  HGB 10.0* 10.4* --  PLT 63* 76* --  LABCREA -- -- --  CREATININE 0.26* -- 0.33*   Estimated Creatinine Clearance: 48.7 ml/min (by C-G formula based on Cr of 0.26).    Microbiology: Recent Results (from the past 720 hour(s))  CULTURE, BLOOD (ROUTINE X 2)     Status: Normal (Preliminary result)   Collection Time   03/17/11 10:16 AM      Component Value Range Status Comment   Specimen Description BLOOD FEMORAL ARTERY COLLECTED BY DOCTOR   Final    Special Requests BOTTLES DRAWN AEROBIC AND ANAEROBIC 5CC   Final    Culture NO GROWTH <24 HRS   Final    Report Status PENDING   Incomplete   MRSA PCR SCREENING     Status: Abnormal   Collection Time   03/17/11  2:28 PM      Component Value Range Status Comment   MRSA by PCR POSITIVE (*) NEGATIVE  Final   CULTURE, BLOOD (ROUTINE X 2)     Status: Normal (Preliminary result)   Collection Time   03/17/11  4:00 PM      Component Value Range Status Comment   Specimen Description BLOOD RIGHT HAND   Final    Special Requests BOTTLES DRAWN AEROBIC AND ANAEROBIC 6CC   Final    Culture  PENDING   Incomplete    Report Status PENDING   Incomplete   CULTURE, SPUTUM-ASSESSMENT     Status: Normal   Collection Time   03/17/11  7:06 PM      Component Value Range Status Comment   Specimen Description TRACHEAL ASPIRATE   Final    Special Requests NONE   Final    Sputum evaluation     Final    Value: THIS SPECIMEN IS ACCEPTABLE. RESPIRATORY CULTURE REPORT TO FOLLOW.     Performed at [facility]   Report Status 03/17/2011 FINAL   Final   CULTURE, RESPIRATORY     Status: Normal (Preliminary result)   Collection Time   03/17/11  7:06 PM      Component Value Range Status Comment   Specimen Description TRACHEAL ASPIRATE   Final    Special Requests NONE   Final    Gram Stain     Final    Value: ABUNDANT WBC PRESENT, PREDOMINANTLY PMN     RARE SQUAMOUS EPITHELIAL CELLS PRESENT     FEW GRAM POSITIVE COCCI IN PAIRS FEW GRAM NEGATIVE RODS RARE GRAM POSITIVE RODS     Gram Stain Report Called to,Read Back By and Verified With: TOLER,M. AT 2020 ON 03/17/2011  BY Ginette Pitman.     Performed at [facility]   Culture PENDING   Incomplete    Report Status PENDING   Incomplete     Medical History: Past Medical History  Diagnosis Date  . Cerebral palsy   . Epilepsy   . Ileus   . Reflux esophagitis     Medications:  Prescriptions prior to admission  Medication Sig Dispense Refill  . acetaminophen (TYLENOL) 500 MG tablet Give 500 mg by tube every 6 (six) hours as needed. Discomfort/Pain      . albuterol (PROVENTIL) (2.5 MG/3ML) 0.083% nebulizer solution Take 2.5 mg by nebulization every 6 (six) hours as needed. Wheezing/Shortness of Breath      . ALPRAZolam (XANAX) 0.5 MG tablet Give 0.5 mg by tube at bedtime as needed. Agitation      . baclofen (LIORESAL) 10 mg/mL SUSP Give 20 mg by tube 4 (four) times daily.       . carbamazepine (TEGRETOL) 100 MG chewable tablet Give 200-300 mg by tube 3 (three) times daily. Takes 200 mg in the morning and 300 mg twice a day.      .  Diazepam (DIASTAT PEDIATRIC RE) Place 5 mg rectally as needed. Seizures      . esomeprazole (NEXIUM) 40 MG capsule Give 40 mg by tube daily before breakfast.      . fluticasone (FLONASE) 50 MCG/ACT nasal spray Place 2 sprays into the nose daily.      Marland Kitchen glycopyrrolate (ROBINUL) 1 MG tablet Give 2.5 mg by tube 2 (two) times daily.      Marland Kitchen guaifenesin (ROBITUSSIN) 100 MG/5ML syrup Give 10-15 mLs by tube 4 (four) times daily as needed. Congestion      . hydrocortisone 2.5 % cream Apply 1 application topically 3 (three) times daily as needed. Redness      . ibuprofen (ADVIL,MOTRIN) 100 MG/5ML suspension Give 250 mg by tube every 4 (four) hours as needed. Pain/Fever      . Melatonin 1 MG TABS Give 1 tablet by tube at bedtime.      . mupirocin ointment (BACTROBAN) 2 % Apply 1 application topically 2 (two) times daily as needed. Redness of Irritation      . nystatin ointment (MYCOSTATIN) Apply 1 application topically 3 (three) times daily as needed. redness      . polyethylene glycol (MIRALAX / GLYCOLAX) packet Give 17 g by tube daily.       . silver sulfADIAZINE (SILVADENE) 1 % cream Apply 1 application topically daily. Spot on her toe      . Sodium Phosphates (FLEET ENEMA RE) Place 1 application rectally daily as needed. Constipation      . sorbitol 70 % solution Give 30 mLs by tube daily as needed. Constipation      . valproate (DEPAKENE) 250 MG/5ML syrup Give 625 mg by tube 3 (three) times daily.      . Wheat Dextrin (BENEFIBER PO) Give 15 mLs by tube 4 (four) times daily.       Marland Kitchen zolpidem (AMBIEN) 5 MG tablet Give 5 mg by tube at bedtime as needed. Sleep       Assessment: 26 yo WF transferred from [facility] w/ worsening CAP. Per CCM note "26 y/o F with PMH of cerebral palsy requiring 24 hour care, epilepsy, reflux esophagitis s/p feeding tube and chronic trach admitted to [facility] on 2/28 with complaints of fever, increased suctioning, increase in O2 requirement and chest congestion. CXR  demonstrated R sided  infiltrate concerning for PNA. Pulmonologist off for weekend at [facility] and patient transferred to [facility] 3/1 for further care." She rec'ed vanc 1 gm 2/28 at 2305 and vanc 750 mg 3/1 at 0834. vanc 750mg  due at 1600 but currently she had no IV access.  She also rec'd azithromycin 500mg  IV x1 dose, avelox 400mg  x 1 dose 2/28 at 1153 am,  clinda 600mg  IV  X 1 dose 3/1 at 1456;  levaquin 500 mg 2/28 and levaquin 750mg  3/1 at 1358. New order to stop clinda and start flagyl 500mg  IV q8h and add aztreonam.   Goal of Therapy:  Vancomycin trough level 15-20 mcg/ml  Plan:  1. Continue vancomycin 750mg  IV q8h 2. Cancel vancomycin trough for 3/2 0700 am as doses are off schedule 2nd no IV access 3. Levaquin 750 mg IV q24 per MD 4. Flagyl 500 mg IV q8h per MD 5. Azactam 2 gm IV q8h per pharmacy for severe PNA.  Len Childs T 03/18/2011,6:20 PM

## 2011-03-18 NOTE — Progress Notes (Signed)
Dr. Karilyn Cota notified of patient's temp of 103.2 axillary.

## 2011-03-18 NOTE — H&P (Signed)
Patient transported to [facility] room 2108 via [facility]. Report called to RN.

## 2011-03-18 NOTE — H&P (Signed)
Name: [name] MRN: [id number] DOB: Apr 14, 1985    LOS: 1  PCCM ADMISSION NOTE   History of Present Illness:  26 y/o F with PMH of cerebral palsy requiring 24 hour care, epilepsy, reflux esophagitis s/p feeding tube and chronic trach admitted to [facility] on 2/28 with complaints of fever, increased suctioning, increase in O2 requirement and chest congestion.  CXR demonstrated R sided infiltrate concerning for PNA.  Pulmonologist off for weekend at [facility] and patient transferred to [facility] 3/1 for further care.    Lines / Drains: ? Pediatric cuffless trach ? PEG  Cultures: 2/28 Blood>>> 2/28 Sputum>>> 2/28 Urine>>>  Antibiotics:   2/28 Vanco (MRSA)>>> 2/28 Levaquin (Pseudomonas)>>> 2/28 Clinda>>>3/1 3/1  Aztreonam (Pseudomonas)>>> 3/1  Flagyl (Anaerobes)>>>  Tests / Events: 3/1  Transferred from [facility] with pneumonia  Past Medical History  Diagnosis Date  . Cerebral palsy   . Epilepsy   . Ileus   . Reflux esophagitis    Past Surgical History  Procedure Date  . Gastrostomy w/ feeding tube    Prior to Admission medications   Medication Sig Start Date End Date Taking? Authorizing Provider  acetaminophen (TYLENOL) 500 MG tablet Give 500 mg by tube every 6 (six) hours as needed. Discomfort/Pain   Yes Historical Provider, MD  albuterol (PROVENTIL) (2.5 MG/3ML) 0.083% nebulizer solution Take 2.5 mg by nebulization every 6 (six) hours as needed. Wheezing/Shortness of Breath   Yes Historical Provider, MD  ALPRAZolam Prudy Feeler) 0.5 MG tablet Give 0.5 mg by tube at bedtime as needed. Agitation   Yes Historical Provider, MD  baclofen (LIORESAL) 10 mg/mL SUSP Give 20 mg by tube 4 (four) times daily.    Yes Historical Provider, MD  carbamazepine (TEGRETOL) 100 MG chewable tablet Give 200-300 mg by tube 3 (three) times daily. Takes 200 mg in the morning and 300 mg twice a day.   Yes Historical Provider, MD  Diazepam (DIASTAT PEDIATRIC RE) Place 5 mg rectally as needed. Seizures   Yes  Historical Provider, MD  esomeprazole (NEXIUM) 40 MG capsule Give 40 mg by tube daily before breakfast.   Yes Historical Provider, MD  fluticasone (FLONASE) 50 MCG/ACT nasal spray Place 2 sprays into the nose daily.   Yes Historical Provider, MD  glycopyrrolate (ROBINUL) 1 MG tablet Give 2.5 mg by tube 2 (two) times daily.   Yes Historical Provider, MD  guaifenesin (ROBITUSSIN) 100 MG/5ML syrup Give 10-15 mLs by tube 4 (four) times daily as needed. Congestion   Yes Historical Provider, MD  hydrocortisone 2.5 % cream Apply 1 application topically 3 (three) times daily as needed. Redness   Yes Historical Provider, MD  ibuprofen (ADVIL,MOTRIN) 100 MG/5ML suspension Give 250 mg by tube every 4 (four) hours as needed. Pain/Fever   Yes Historical Provider, MD  Melatonin 1 MG TABS Give 1 tablet by tube at bedtime.   Yes Historical Provider, MD  mupirocin ointment (BACTROBAN) 2 % Apply 1 application topically 2 (two) times daily as needed. Redness of Irritation   Yes Historical Provider, MD  nystatin ointment (MYCOSTATIN) Apply 1 application topically 3 (three) times daily as needed. redness   Yes Historical Provider, MD  polyethylene glycol (MIRALAX / GLYCOLAX) packet Give 17 g by tube daily.    Yes Historical Provider, MD  silver sulfADIAZINE (SILVADENE) 1 % cream Apply 1 application topically daily. Spot on her toe   Yes Historical Provider, MD  Sodium Phosphates (FLEET ENEMA RE) Place 1 application rectally daily as needed. Constipation  Yes Historical Provider, MD  sorbitol 70 % solution Give 30 mLs by tube daily as needed. Constipation   Yes Historical Provider, MD  valproate (DEPAKENE) 250 MG/5ML syrup Give 625 mg by tube 3 (three) times daily.   Yes Historical Provider, MD  Wheat Dextrin (BENEFIBER PO) Give 15 mLs by tube 4 (four) times daily.    Yes Historical Provider, MD  zolpidem (AMBIEN) 5 MG tablet Give 5 mg by tube at bedtime as needed. Sleep   Yes Historical Provider, MD    Allergies Allergies  Allergen Reactions  . Amoxicillin Other (See Comments)    Yeast Infection   . Morphine And Related Other (See Comments)    Unknown  . Ceftazidime Rash  . Fortaz (Ceftazidime Sodium In D5w) Rash   Family History No family history on file.  Social History  reports that she has never smoked. She does not have any smokeless tobacco history on file. She reports that she does not drink alcohol or use illicit drugs.  Review Of Systems: unable to complete with patient.  Information obtained from Mother at bedside.   Vital Signs: Filed Vitals:   03/18/11 1203 03/18/11 1300 03/18/11 1400 03/18/11 1500  BP:   116/64   Pulse:  137 127 135  Temp:      TempSrc:      Resp:  30 34 29  Height:      Weight:      SpO2: 95% 93% 97% 95%   I/O last 3 completed shifts: In: 2187 [I.V.:700; Other:937; IV Piggyback:550] Out: -   Physical Examination: General: chronically ill, increased work of breathing Neuro: awake, alert, CP extremities CV: s1s2 tachy, regular PULM: resp's shallow, labored, lungs bilaterally coarse rhonchi, diminished on left GI: round / soft, bsx4 active, PEG Extremities: Warm/dry  Ventilator settings: Vent Mode:  [-]  FiO2 (%):  [0.4 %-40 %] 40 %  Labs     CBC  Lab 03/18/11 0700 03/17/11 1050  HGB 10.0* 10.4*  HCT 31.3* 31.9*  WBC 5.1 7.4  PLT 63* 76*   BMET  Lab 03/18/11 0700 03/17/11 1012  NA 135 128*  K 4.0 3.9  CL 98 92*  CO2 31 30  GLUCOSE 100* 92  BUN 4* 6  CREATININE 0.26* 0.33*  CALCIUM 8.5 8.3*  MG -- --  PHOS -- --   Radiology: 2/28 CXR>>>Mild right suprahilar atelectasis/infiltrate 3/1 CXR>>>Increased airspace opacity in the central right upper lobe, suspicious for pneumonia. Chronic elevation of the right hemidiaphragm.  Assessment and Plan:  Pneumonia in setting of long term tracheostomy ( ? Pseudomonas).  Likely aspiration.  Penicillin / Cephalosporin allergy.  Impending respiratory failure. -ENT  consultation to change to cuffed tracheostomy of larger size -Alternatively will attempt to intubate orally with fiber optics -May need mechanical ventilation -ABG -Bronchodilators  Cerebral palsy.  History of seizures.  No active seizures. -Continue preadmission medications -Tegretol / valproic acid level  GERD, dysphagia, s/p PEG -PEG feeding -Protonix  Thrombocytopenia, etiology unknown.  No active hemorrhage. -Avoid heparin -Trend platelets  Best practices / Disposition: -->Full Code -->DVT Px: scd's -->GI Px: protonix -->Diet: PEG feeding  Canary Brim, NP-C [facility] Pgr: [phone]  03/18/2011, 3:41 PM   Patient examined, records reviewed, assessment and plan as above.  The patient is critically ill with multiple organ systems failure and requires high complexity decision making for assessment and support, frequent evaluation and titration of therapies, application of advanced monitoring technologies and extensive interpretation of multiple  databases. Critical Care Time devoted to patient care services described in this note is 60 minutes.  Orlean Bradford, M.D. Pulmonary and Critical Care Medicine [facility] Cell: [phone] Pager: [phone]

## 2011-03-18 NOTE — Progress Notes (Signed)
Patient reviewed. This 26 year old with cerebral palsy, tracheostomy, PEG tube feeding is having increased work of breathing. Arterial blood gas attempts were unsuccessful. Her chest x-ray shows worsening pneumonia. Her resting tachycardia has increased. She had a fever of 103.2 axillary earlier today. I think she is at risk of decompensating and needing intubation and mechanical ventilation in the intensive care setting. Dr. Juanetta Gosling, pulmonologist, is on vacation. In the best interest of the patient, I think it is appropriate to transfer this patient to unit 2100 at [facility]. I've spoken to the intensivist there has agreed to take the patient under his service.  Plan: 1. Transfer patient to [facility] to the medical intensive care unit, unit 2100. I've discussed this with the patient's mother and explained the reasons above for the transfer. She is agreeable to this.

## 2011-03-18 NOTE — Progress Notes (Signed)
Patient trach suctioned obtained moderated amount of thick white secretions.  Patient's mother requested no gauze behind trach holder. Area dried, cleaned with fresh gauze place under trach opening.

## 2011-03-18 NOTE — Progress Notes (Signed)
INITIAL ADULT NUTRITION ASSESSMENT Date: 03/18/2011   Time: 10:49 AM Reason for Assessment: Screened for Nutrition Risk  ASSESSMENT: Female 26 y.o.  Dx: Community acquired pneumonia  Hx:  Past Medical History  Diagnosis Date  . Cerebral palsy   . Epilepsy   . Ileus   . Reflux esophagitis    Related Meds:  Scheduled Meds:   . sodium chloride   Intravenous Once  . acyclovir cream   Topical QID  . baclofen  20 mg Per Tube QID  . carbamazepine  200 mg Oral Daily   And  . carbamazepine  300 mg Oral Custom  . chlorhexidine  15 mL Mouth/Throat QID  . Chlorhexidine Gluconate Cloth  6 each Topical Q0600  . feeding supplement  237 mL Per Tube QID  . fluticasone  2 spray Each Nare Daily  . free water  100 mL Per Tube QID  . free water  50 mL Per Tube BID  . glycopyrrolate  2.5 mg Per Tube BID  . heparin  5,000 Units Subcutaneous Q8H  . levalbuterol  0.63 mg Nebulization Q4H  . levofloxacin (LEVAQUIN) IV  750 mg Intravenous Q24H  . moxifloxacin  400 mg Intravenous Once  . mupirocin ointment  1 application Nasal BID  . pantoprazole sodium  40 mg Per Tube Daily  . polyethylene glycol  17 g Per Tube Daily  . silver sulfADIAZINE  1 application Topical Daily  . sodium chloride  1,000 mL Intravenous Once  . sodium chloride  1,000 mL Intravenous Once  . sodium chloride  500 mL Intravenous Once  . Valproic Acid  625 mg Per Tube TID  . vancomycin  750 mg Intravenous Q8H  . vancomycin  1,000 mg Intravenous Once  . DISCONTD: acyclovir ointment   Topical QID  . DISCONTD: albuterol  2.5 mg Nebulization Q4H  . DISCONTD: azithromycin  500 mg Intravenous Q24H  . DISCONTD: baclofen  20 mg Per Tube QID  . DISCONTD: baclofen  20 mg Per Tube QID  . DISCONTD: carbamazepine  200 mg Oral Daily  . DISCONTD: carbamazepine  200 mg Oral Daily  . DISCONTD: carbamazepine  200-300 mg Per Tube TID  . DISCONTD: carbamazepine  300 mg Oral Custom  . DISCONTD: carbamazepine  300 mg Oral Custom  . DISCONTD:  fluticasone  2 spray Each Nare Daily  . DISCONTD: glycopyrrolate  2.5 mg Per Tube BID  . DISCONTD: levofloxacin (LEVAQUIN) IV  500 mg Intravenous Q24H  . DISCONTD: Melatonin  1 tablet Tube QHS  . DISCONTD: pantoprazole sodium  40 mg Per Tube Daily  . DISCONTD: pantoprazole sodium  40 mg Per Tube Daily  . DISCONTD: polyethylene glycol  17 g Per Tube Daily  . DISCONTD: polyethylene glycol  17 g Per Tube Daily  . DISCONTD: Valproic Acid  625 mg Per Tube TID   Continuous Infusions:   . sodium chloride 50 mL/hr at 03/18/11 0600   PRN Meds:.acetaminophen, albuterol, ALPRAZolam, antiseptic oral rinse, diazepam, guaiFENesin, hydrocortisone cream, ibuprofen, mupirocin ointment, nystatin ointment, sodium phosphate, sorbitol, zolpidem, DISCONTD: albuterol  Ht: 4' (121.9 cm)  Wt: 101 lb 10.1 oz (46.1 kg) UBW: 90 lb. %UBW: 112.2%  Food/Nutrition Related Hx: Spoke with patient's mother. She reported home TF regimen is Ensure Plus bolus via G-tube at 8am, 2pm, 8pm, 2am. With 4  water flushes and 2  50ml water flushes daily. Provides 1400 kcal and 52 grams of protein daily.   Labs:  CMP     Component Value  Date/Time   NA 135 03/18/2011 0700   K 4.0 03/18/2011 0700   CL 98 03/18/2011 0700   CO2 31 03/18/2011 0700   GLUCOSE 100* 03/18/2011 0700   BUN 4* 03/18/2011 0700   CREATININE 0.26* 03/18/2011 0700   CALCIUM 8.5 03/18/2011 0700   PROT 6.4 03/18/2011 0700   ALBUMIN 2.8* 03/18/2011 0700   AST 22 03/18/2011 0700   ALT 6 03/18/2011 0700   ALKPHOS 50 03/18/2011 0700   BILITOT 0.1* 03/18/2011 0700   GFRNONAA >90 03/18/2011 0700   GFRAA >90 03/18/2011 0700    Intake/Output Summary (Last 24 hours) at 03/18/11 1057 Last data filed at 03/18/11 0600  Gross per 24 hour  Intake   2187 ml  Output      0 ml  Net   2187 ml   *fluid retention noted  Diet Order:  Tube Feeding  Supplements/Tube Feeding:Ensure Immune Health 4 times daily. Provides 1000 kcal and 36 grams of protein daily.   IVF:    sodium chloride  Last Rate: 50 mL/hr at 03/18/11 0600    Estimated Nutritional Needs:  Current Home Regimin   Kcal: 1400 kcal Protein: 52 grams of protein  Fluid: 500 ml free water   NUTRITION DIAGNOSIS: -Inadequate enteral nutrition infusion (NI-2.3).  Status: Ongoing  RELATED TO: low volume and lower calorie of formula via bolus administration  AS EVIDENCE BY: patient receiving 71.4% of kcal provided by home tube feeding regimen.    MONITORING/EVALUATION(Goals): Labs, TF tolerance, I/O's, Weight trends 1. Meet 90% of estimated energy needs  EDUCATION NEEDS: -No education needs identified at this time  INTERVENTION: 1. Recommend increasing Ensure Immune Health to 5 per day to provide 1250 kcal and 45 grams of protein daily. OR Recommend switching formula to Ensure Clinical Strength 4 times daily to provide 1400 kcal and 52 grams of protein.   Dietitian [phone]  DOCUMENTATION CODES Per approved criteria  -Not Applicable    Iven Finn [facility] 03/18/2011, 10:49 AM

## 2011-03-18 NOTE — Progress Notes (Signed)
Subjective: This  lady is still requiring 40% FiO2 to maintain saturations. She appears to have increased work of breathing.        Physical Exam: Blood pressure 115/65, pulse 113, temperature 99 F (37.2 C), temperature source Oral, resp. rate 23, height 4' (1.219 m), weight 46.1 kg (101 lb 10.1 oz), last menstrual period 02/16/2011, SpO2 98.00%.  she has increased work of breathing. There is no peripheral central cyanosis. Lung fields bilaterally show crackles. I'm suspicious of some wheezing also. Heart sounds are in sinus rhythm but tachycardic at rest about 130.   Investigations:  Recent Results (from the past 240 hour(s))  CULTURE, BLOOD (ROUTINE X 2)     Status: Normal (Preliminary result)   Collection Time   03/17/11 10:16 AM      Component Value Range Status Comment   Specimen Description BLOOD FEMORAL ARTERY COLLECTED BY DOCTOR   Final    Special Requests BOTTLES DRAWN AEROBIC AND ANAEROBIC 5CC   Final    Culture NO GROWTH <24 HRS   Final    Report Status PENDING   Incomplete   MRSA PCR SCREENING     Status: Abnormal   Collection Time   03/17/11  2:28 PM      Component Value Range Status Comment   MRSA by PCR POSITIVE (*) NEGATIVE  Final   CULTURE, BLOOD (ROUTINE X 2)     Status: Normal (Preliminary result)   Collection Time   03/17/11  4:00 PM      Component Value Range Status Comment   Specimen Description BLOOD RIGHT HAND   Final    Special Requests BOTTLES DRAWN AEROBIC AND ANAEROBIC 6CC   Final    Culture PENDING   Incomplete    Report Status PENDING   Incomplete   CULTURE, SPUTUM-ASSESSMENT     Status: Normal   Collection Time   03/17/11  7:06 PM      Component Value Range Status Comment   Specimen Description TRACHEAL ASPIRATE   Final    Special Requests NONE   Final    Sputum evaluation     Final    Value: THIS SPECIMEN IS ACCEPTABLE. RESPIRATORY CULTURE REPORT TO FOLLOW.     Performed at [facility]   Report Status 03/17/2011 FINAL   Final     CULTURE, RESPIRATORY     Status: Normal (Preliminary result)   Collection Time   03/17/11  7:06 PM      Component Value Range Status Comment   Specimen Description TRACHEAL ASPIRATE   Final    Special Requests NONE   Final    Gram Stain     Final    Value: ABUNDANT WBC PRESENT, PREDOMINANTLY PMN     RARE SQUAMOUS EPITHELIAL CELLS PRESENT     FEW GRAM POSITIVE COCCI IN PAIRS FEW GRAM NEGATIVE RODS RARE GRAM POSITIVE RODS     Gram Stain Report Called to,Read Back By and Verified With: TOLER,M. AT 2020 ON 03/17/2011 BY BAUGHAM,M.     Performed at [facility]   Culture PENDING   Incomplete    Report Status PENDING   Incomplete      Basic Metabolic Panel:  Basename 03/18/11 0700 03/17/11 1012  NA 135 128*  K 4.0 3.9  CL 98 92*  CO2 31 30  GLUCOSE 100* 92  BUN 4* 6  CREATININE 0.26* 0.33*  CALCIUM 8.5 8.3*  MG -- --  PHOS -- --   Liver Function Tests:  [employer]  03/18/11 0700 03/17/11 1012  AST 22 23  ALT 6 10  ALKPHOS 50 49  BILITOT 0.1* 0.1*  PROT 6.4 6.1  ALBUMIN 2.8* 2.8*     CBC:  Basename 03/18/11 0700 03/17/11 1050  WBC 5.1 7.4  NEUTROABS -- 3.7  HGB 10.0* 10.4*  HCT 31.3* 31.9*  MCV 83.9 82.2  PLT 63* 76*    Dg Chest Port 1 View  03/17/2011  *RADIOLOGY REPORT*  Clinical Data: Congestion, shortness of breath  PORTABLE CHEST - 1 VIEW  Comparison: August 26, 2008  Findings: Tracheostomy remains in place.  Mild cardiomegaly appears unchanged.  Right suprahilar opacity/atelectasis does not appear changed.  Marked elevation of the right hemi diaphragm remains present with gaseous distention of the colon at the hepatic flexure. No pleural effusions.  IMPRESSION: Mild right suprahilar atelectasis/infiltrate.  Original Report Authenticated By: Brandon Melnick, M.D.      Medications: I have reviewed the patient's current medications.  Impression:  1. Community-acquired pneumonia. 2. Severe cerebral palsy. 3. Epilepsy.     Plan:  1. Continue with  current antibiotics. These include Levaquin and vancomycin. 2. Repeat chest x-ray.     LOS: 1 day   Wilson Singer Pager [phone]  03/18/2011, 8:34 AM

## 2011-03-18 NOTE — Progress Notes (Signed)
CRITICAL VALUE ALERT  Critical value received:  Positive Blood Culture, Gram + rods in the aerobic bottle   Date of notification:  03/18/2011  Time of notification:  2044  Critical value read back:yes  Nurse who received alert:  Hezzie Bump  MD notified (1st page):  Dr. Craige Cotta  Time of first page: 2044   MD notified (2nd page):  Time of second page:  Responding MD:  Dr. Craige Cotta  Time MD responded: 2044

## 2011-03-19 LAB — CBC
MCH: 26.7 pg (ref 26.0–34.0)
MCV: 84.8 fL (ref 78.0–100.0)
Platelets: 54 10*3/uL — ABNORMAL LOW (ref 150–400)
RBC: 3.3 MIL/uL — ABNORMAL LOW (ref 3.87–5.11)
RDW: 13.3 % (ref 11.5–15.5)
WBC: 5.2 10*3/uL (ref 4.0–10.5)

## 2011-03-19 LAB — BASIC METABOLIC PANEL
CO2: 31 mEq/L (ref 19–32)
Calcium: 8 mg/dL — ABNORMAL LOW (ref 8.4–10.5)
Chloride: 97 mEq/L (ref 96–112)
Creatinine, Ser: 0.21 mg/dL — ABNORMAL LOW (ref 0.50–1.10)
GFR calc Af Amer: >90 mL/min (ref >90)
Sodium: 136 mEq/L (ref 135–145)

## 2011-03-19 LAB — POCT I-STAT 3, ART BLOOD GAS (G3+)
Bicarbonate: 31.2 mEq/L — ABNORMAL HIGH (ref 20.0–24.0)
O2 Saturation: 94 %
Patient temperature: 102.5
TCO2: 33 mmol/L (ref 0–100)
pCO2 arterial: 65.1 mmHg (ref 35.0–45.0)
pH, Arterial: 7.299 — ABNORMAL LOW (ref 7.350–7.400)

## 2011-03-19 LAB — MAGNESIUM: Magnesium: 2 mg/dL (ref 1.5–2.5)

## 2011-03-19 LAB — PROCALCITONIN: Procalcitonin: <0.1 ng/mL

## 2011-03-19 MED ORDER — JEVITY 1.2 CAL PO LIQD
1000.0000 mL | ORAL | Status: DC
  Administered 2011-03-19 – 2011-03-20 (×3): 1000 mL
  Administered 2011-03-21: 45 mL/h
  Administered 2011-03-22: 1000 mL
  Filled 2011-03-19 (×7): qty 1000

## 2011-03-19 MED ORDER — DEXTROSE 5 % IV SOLN
2.0000 g | Freq: Three times a day (TID) | INTRAVENOUS | Status: DC
  Administered 2011-03-19 – 2011-03-21 (×7): 2 g via INTRAVENOUS
  Filled 2011-03-19 (×11): qty 2

## 2011-03-19 MED ORDER — LEVALBUTEROL HCL 0.63 MG/3ML IN NEBU
0.6300 mg | INHALATION_SOLUTION | Freq: Four times a day (QID) | RESPIRATORY_TRACT | Status: DC
  Administered 2011-03-19 – 2011-03-20 (×4): 0.63 mg via RESPIRATORY_TRACT
  Filled 2011-03-19 (×8): qty 3

## 2011-03-19 MED ORDER — PROPOFOL 10 MG/ML IV EMUL: 5.0000 ug/kg/min | INTRAVENOUS | Status: DC

## 2011-03-19 NOTE — Progress Notes (Signed)
Name: [name] MRN: [id number] DOB: 1986-01-09    LOS: 2  PCCM FOLLOW UP NOTE   History of Present Illness:  26 y/o F with PMH of cerebral palsy requiring 24 hour care, epilepsy, reflux esophagitis s/p feeding tube and chronic trach admitted to [facility] on 2/28 with complaints of fever, increased suctioning, increase in O2 requirement and chest congestion.  CXR demonstrated R sided infiltrate concerning for PNA.  Pulmonologist off for weekend at [facility] and patient transferred to [facility] 3/1 for further care.    Lines / Drains: Pediatric cuffless trach (pta)>>>3/1 #4 cuffless 3/1>>> PEG (pta)>>>  Cultures: 2/28 Blood>>> 1/2 GPR>>> 2/28 Sputum>>> GPR, GPC>>> 2/28 Urine>>>  Antibiotics:   2/28 Vanco (MRSA)>>> 2/28 Levaquin (Pseudomonas)>>> 2/28 Clinda>>>3/1 3/1  Aztreonam (Pseudomonas)>>> 3/1  Flagyl (Anaerobes)>>>  Tests / Events: 3/1  Transferred from [facility] with pneumonia    Vital Signs: Filed Vitals:   03/19/11 0700 03/19/11 0742 03/19/11 0800 03/19/11 0827  BP: 86/45  84/40 84/40  Pulse: 107  81 101  Temp:  101.8 F (38.8 C)    TempSrc:  Oral    Resp: 22  16 21   Height:      Weight:      SpO2: 100%  100% 100%    Intake/Output Summary (Last 24 hours) at 03/19/11 0945 Last data filed at 03/19/11 1308  Gross per 24 hour  Intake   1932 ml  Output    200 ml  Net   1732 ml    Physical Examination: General: chronically ill, cerebral palsy Neuro: sleeping, arousable, CV: s1s2 tachy, regular PULM: resp's shallow,non labored on PS 12/5, lungs bilaterally coarse rhonchi, diminished on left GI: round / soft, bsx4 active, PEG Extremities: Warm/dry, contracted   Ventilator settings: Vent Mode:  [-] PSV FiO2 (%):  [40 %] 40 % PEEP:  [5 cmH20] 5 cmH20 Pressure Support:  [12 cmH20] 12 cmH20  Labs    CBC  Lab 03/19/11 0500 03/18/11 0700 03/17/11 1050  HGB 8.8* 10.0* 10.4*  HCT 28.0* 31.3* 31.9*  WBC 5.2 5.1 7.4  PLT 54* 63* 76*   BMET  Lab 03/19/11  0500 03/18/11 0700 03/17/11 1012  NA 136 135 128*  K 3.7 4.0 --  CL 97 98 92*  CO2 31 31 30   GLUCOSE 95 100* 92  BUN 6 4* 6  CREATININE 0.21* 0.26* 0.33*  CALCIUM 8.0* 8.5 8.3*  MG 2.0 -- --  PHOS 3.3 -- --   Radiology: Dg Chest Port 1 View  03/18/2011  *RADIOLOGY REPORT*  Clinical Data: Pneumonia.  PORTABLE CHEST - 1 VIEW  Comparison: 03/17/2011  Findings: Tracheostomy tube remains in place, and severe elevation of right hemidiaphragm is again demonstrated.  Airspace disease in the central right upper lobe shows mild worsening since previous exam.  No evidence of pleural effusion. Heart size and mediastinal contours appear stable.  IMPRESSION:  1.  Increased airspace opacity in the central right upper lobe, suspicious for pneumonia. 2.  Chronic elevation of the right hemidiaphragm.  Original Report Authenticated By: Danae Orleans, M.D.   Dg Chest Port 1 View  03/17/2011  *RADIOLOGY REPORT*  Clinical Data: Congestion, shortness of breath  PORTABLE CHEST - 1 VIEW  Comparison: August 26, 2008  Findings: Tracheostomy remains in place.  Mild cardiomegaly appears unchanged.  Right suprahilar opacity/atelectasis does not appear changed.  Marked elevation of the right hemi diaphragm remains present with gaseous distention of the colon at the hepatic flexure. No pleural effusions.  IMPRESSION: Mild right suprahilar atelectasis/infiltrate.  Original Report Authenticated By: Brandon Melnick, M.D.    Assessment and Plan:  Acute on chronic respiratory failure r/t pneumonia in setting of long term tracheostomy. Likely aspiration.  Penicillin / Cephalosporin allergy.  Now with #4 cuffless trach (previous peds trach, ENT unable to pass #4 cuffed).  Failed trach collar trial 3/2.  Has significant air leak around trach. PLAN -  -ENT following, now s/p upsize trach to cuffless #4>>will need cuffed trach -ABG -BD -f/u CXR    Pneumonia - CAP v Aspiration. Sputum with GPR, GPC and 1/2 BC with GPR. PLAN -    Cont broad spectrum abx (pcn, cephalosporin allergy)    Cerebral palsy.  History of seizures.  No active seizures. PLAN -  -Continue preadmission medications   GERD, dysphagia, s/p PEG PLAN -  -TF via PEG - will transition to continuous rather than bolus while in ICU -Protonix   Thrombocytopenia- etiology unknown.  No active hemorrhage.  Lab 03/19/11 0500 03/18/11 0700 03/17/11 1050  PLT 54* 63* 76*  PLAN -  -Avoid heparin -Trend platelets   Best practices / Disposition: -->Full Code -->DVT Px: scd's -->GI Px: protonix -->Diet: PEG feeding   WHITEHEART,KATHRYN, NP 03/19/2011  9:43 AM Pager: [phone]  Reviewed above, examined pt, and agree with assessment/plan.  She has trouble maintaining Vt due to leakage around trach.  Tried on trach collar, but developed hypoxia>>placed back on vent.  Will need to ask ENT to assess for insertion of cuffed trach>>will likely need to be done in the OR.  Continue abx including aztreonam until final culture results back.  Critical care time 35 minutes.  Coralyn Helling, MD 03/19/2011, 2:39 PM Pager:  [phone]

## 2011-03-19 NOTE — Progress Notes (Signed)
Nutrition Follow-up  Diet Order:  NPO; TF via PEG:  Ensure Immune Health 1 can QID; free water 100 ml QID and 50 ml BID.  TF is providing 1000 kcals, 36 grams protein, 1284 ml free water total daily.  Meds: Scheduled Meds:   . acyclovir cream   Topical QID  . aztreonam  2 g Intravenous Q8H  . baclofen  20 mg Per Tube QID  . carbamazepine  200 mg Oral Daily   And  . carbamazepine  300 mg Oral Custom  . chlorhexidine  15 mL Mouth/Throat QID  . Chlorhexidine Gluconate Cloth  6 each Topical Q0600  . feeding supplement  237 mL Per Tube QID  . fluticasone  2 spray Each Nare Daily  . free water  100 mL Per Tube QID  . free water  50 mL Per Tube BID  . glycopyrrolate  2.5 mg Per Tube BID  . levalbuterol  0.63 mg Nebulization Q4H  . levofloxacin (LEVAQUIN) IV  750 mg Intravenous Q24H  . lidocaine  20 mL Mouth/Throat Once  . metronidazole  500 mg Intravenous Q8H  . mupirocin ointment  1 application Nasal BID  . pantoprazole sodium  40 mg Per Tube Daily  . polyethylene glycol  17 g Per Tube Daily  . silver sulfADIAZINE  1 application Topical Daily  . Valproic Acid  625 mg Per Tube TID  . vancomycin  750 mg Intravenous Q8H  . DISCONTD: clindamycin (CLEOCIN) IV  600 mg Intravenous Q8H  . DISCONTD: heparin  5,000 Units Subcutaneous Q8H   Continuous Infusions:   . sodium chloride 50 mL/hr (03/18/11 2320)  . DISCONTD: sodium chloride     PRN Meds:.acetaminophen, albuterol, antiseptic oral rinse, diazepam, guaiFENesin, hydrocortisone cream, ibuprofen, mupirocin ointment, nystatin ointment, sodium chloride, sodium phosphate, sorbitol, zolpidem, DISCONTD: sodium chloride, DISCONTD: albuterol, DISCONTD: ALPRAZolam  Labs:  CMP     Component Value Date/Time   NA 136 03/19/2011 0500   K 3.7 03/19/2011 0500   CL 97 03/19/2011 0500   CO2 31 03/19/2011 0500   GLUCOSE 95 03/19/2011 0500   BUN 6 03/19/2011 0500   CREATININE 0.21* 03/19/2011 0500   CALCIUM 8.0* 03/19/2011 0500   PROT 6.4 03/18/2011 0700   ALBUMIN 2.8* 03/18/2011 0700   AST 22 03/18/2011 0700   ALT 6 03/18/2011 0700   ALKPHOS 50 03/18/2011 0700   BILITOT 0.1* 03/18/2011 0700   GFRNONAA >90 03/19/2011 0500   GFRAA >90 03/19/2011 0500     Intake/Output Summary (Last 24 hours) at 03/19/11 0948 Last data filed at 03/19/11 1914  Gross per 24 hour  Intake   1932 ml  Output    200 ml  Net   1732 ml   Spoke with patient's mother regarding usual TF regimen:  Ensure Plus QID (8 AM, 2 PM, 8 PM, 2 AM) with 100 ml free water QID and 50 ml free water with medications BID.  Discussed changing TF to continuous regimen while critically ill. Mother is fine with this transition.  Can change back to bolus feedings before discharging home.  Also discussed this with Randon Goldsmith, NP; RD to change TF order.  Weight Status:  45.9 kg (stable)  Re-estimated needs:  1250-1450 kcals, 55-70 grams  Nutrition Dx:  Inadequate enteral nutrition infusion, ongoing.  Goal:  Meet >/= 90% of estimated nutrition needs, unmet.  Intervention:    Change TF to continuous regimen while acutely ill on ventilator.  Jevity 1.2 at 15 ml/h, increase by 10 ml  every 4 hours to goal rate of 45 ml/h to provide 1296 kcals, 60 grams protein, 875 ml free water daily.  100 ml free water flushes QID.  Monitor:  TF tolerance/adequacy, labs, weight trend.   Hettie Holstein Pager #:  [phone]

## 2011-03-19 NOTE — Progress Notes (Addendum)
Patient ID: [name], female   DOB: June 23, 1985, 26 y.o.   MRN: [id number] Having some trouble with air leak with the uncuffed tube. Ideally, a #4 or 6 adjustable length tube would be appropriate. The 4 uncuffed right now is a little too long. We will order a #6 adjustable length, but for now I fashioned a tube out of a #6 endotracheal tube. This is working well for now and there is no further air leak. When the ordered tube arrives, I will replace it.      9:00 PM Having trouble with the connector falling out of the endotracheal tube. Mother is demanding that I replace the uncuffed for trach tube. We had a lengthy discussion with her and multiple family members about the importance of keeping a cuffed tube in place. My solution to this problem was to heat up the endotracheal tube and then replaced the connector, expecting that it will stay in much tighter. She allowed me to do this and this was done without difficulty. If this fails to resolve the problem, then we may need to replace the uncuffed 4. A new tube that has an adjustable length it has been ordered but will not be here until Tuesday.

## 2011-03-20 ENCOUNTER — Inpatient Hospital Stay (HOSPITAL_COMMUNITY): Payer: Medicare Other

## 2011-03-20 DIAGNOSIS — G40309 Generalized idiopathic epilepsy and epileptic syndromes, not intractable, without status epilepticus: Secondary | ICD-10-CM

## 2011-03-20 DIAGNOSIS — J9600 Acute respiratory failure, unspecified whether with hypoxia or hypercapnia: Secondary | ICD-10-CM

## 2011-03-20 DIAGNOSIS — D696 Thrombocytopenia, unspecified: Secondary | ICD-10-CM

## 2011-03-20 DIAGNOSIS — J159 Unspecified bacterial pneumonia: Secondary | ICD-10-CM

## 2011-03-20 LAB — CULTURE, BLOOD (ROUTINE X 2): Culture  Setup Time: 201303022005

## 2011-03-20 LAB — CULTURE, RESPIRATORY W GRAM STAIN

## 2011-03-20 LAB — BASIC METABOLIC PANEL
CO2: 30 mEq/L (ref 19–32)
Chloride: 96 mEq/L (ref 96–112)
GFR calc Af Amer: >90 mL/min (ref >90)
Potassium: 3.1 mEq/L — ABNORMAL LOW (ref 3.5–5.1)

## 2011-03-20 LAB — VANCOMYCIN, TROUGH: Vancomycin Tr: 5.2 ug/mL — ABNORMAL LOW (ref 10.0–20.0)

## 2011-03-20 LAB — CBC
HCT: 24.9 % — ABNORMAL LOW (ref 36.0–46.0)
Hemoglobin: 7.8 g/dL — ABNORMAL LOW (ref 12.0–15.0)
WBC: 4.6 10*3/uL (ref 4.0–10.5)

## 2011-03-20 MED ORDER — POTASSIUM CHLORIDE 20 MEQ/15ML (10%) PO LIQD
40.0000 meq | Freq: Four times a day (QID) | ORAL | Status: AC
  Administered 2011-03-20 (×2): 40 meq
  Filled 2011-03-20 (×2): qty 30

## 2011-03-20 MED ORDER — LEVALBUTEROL TARTRATE 45 MCG/ACT IN AERO
6.0000 | INHALATION_SPRAY | Freq: Four times a day (QID) | RESPIRATORY_TRACT | Status: DC
  Administered 2011-03-20 – 2011-04-04 (×59): 6 via RESPIRATORY_TRACT
  Filled 2011-03-20 (×2): qty 15

## 2011-03-20 MED ORDER — VANCOMYCIN HCL 1000 MG IV SOLR
1250.0000 mg | Freq: Three times a day (TID) | INTRAVENOUS | Status: DC
  Administered 2011-03-20 – 2011-03-21 (×5): 1250 mg via INTRAVENOUS
  Filled 2011-03-20 (×7): qty 1250

## 2011-03-20 MED ORDER — LEVALBUTEROL TARTRATE 45 MCG/ACT IN AERO
6.0000 | INHALATION_SPRAY | RESPIRATORY_TRACT | Status: DC | PRN
  Administered 2011-03-21: 6 via RESPIRATORY_TRACT
  Filled 2011-03-20: qty 15

## 2011-03-20 NOTE — Progress Notes (Signed)
Name: [name] MRN: [id number] DOB: 1985-11-30    LOS: 3  PCCM FOLLOW UP NOTE   History of Present Illness:  26 y/o F with PMH of cerebral palsy requiring 24 hour care, epilepsy, reflux esophagitis s/p feeding tube and chronic trach admitted to [facility] on 2/28 with complaints of fever, increased suctioning, increase in O2 requirement and chest congestion.  CXR demonstrated R sided infiltrate concerning for PNA.  Pulmonologist off for weekend at [facility] and patient transferred to [facility] 3/1 for further care.    Lines / Drains: Pediatric cuffless trach (pta)>>>3/1 #4 cuffless 3/1>>>3/2    #6 ETT/trach 3/2>>> PEG (pta)>>>  Cultures: 2/28 Blood>>> 1/2 GPR>>>Diphtheroids 2/28 Sputum>>> GPR, GPC>>>normal flora  2/28 Urine>>>  Antibiotics:   2/28 Vanco (MRSA)>>> 2/28 Levaquin (Pseudomonas)>>> 2/28 Clinda>>>3/1 3/1  Aztreonam (Pseudomonas)>>> 3/1  Flagyl (Anaerobes)>>>  Tests / Events: 3/1  Transferred from [facility] with pneumonia    Vital Signs: Filed Vitals:   03/20/11 0600 03/20/11 0700 03/20/11 0739 03/20/11 0816  BP: 105/78 118/95  84/54  Pulse: 114 125  111  Temp:   101.7 F (38.7 C)   TempSrc:   Oral   Resp: 28 27  29   Height:      Weight:      SpO2: 100% 100%  99%    Intake/Output Summary (Last 24 hours) at 03/20/11 0903 Last data filed at 03/20/11 0700  Gross per 24 hour  Intake   3120 ml  Output      0 ml  Net   3120 ml    Physical Examination: General: chronically ill, cerebral palsy Neuro: sleeping, arousable, CV: s1s2 tachy, regular PULM: resp's shallow,non labored on PS 12/5, lungs bilaterally coarse rhonchi, diminished on left GI: round / soft, bsx4 active, PEG Extremities: Warm/dry, contracted   Ventilator settings: Vent Mode:  [-] PSV FiO2 (%):  [40 %] 40 % PEEP:  [5 cmH20] 5 cmH20 Pressure Support:  [12 cmH20] 12 cmH20  Labs    CBC  Lab 03/20/11 0447 03/19/11 0500 03/18/11 0700  HGB 7.8* 8.8* 10.0*  HCT 24.9* 28.0* 31.3*  WBC 4.6  5.2 5.1  PLT 64* 54* 63*   BMET  Lab 03/20/11 0447 03/19/11 0500 03/18/11 0700 03/17/11 1012  NA 132* 136 135 128*  K 3.1* 3.7 -- --  CL 96 97 98 92*  CO2 30 31 31 30   GLUCOSE 114* 95 100* 92  BUN 7 6 4* 6  CREATININE 0.24* 0.21* 0.26* 0.33*  CALCIUM 7.9* 8.0* 8.5 8.3*  MG -- 2.0 -- --  PHOS -- 3.3 -- --   Radiology: Dg Chest Port 1 View  03/20/2011  *RADIOLOGY REPORT*  Clinical Data: Respiratory failure  PORTABLE CHEST - 1 VIEW  Comparison: 03/18/2011  Findings: A right arm PICC line has been advanced to the distal SVC.  Tracheostomy device stable.  Marked elevation of the right diaphragmatic leaflet as before, with some increase in atelectasis or airspace disease at the right lung base.  Heart size stable.  No definite effusion.  IMPRESSION:  1.  PICC line to low SVC. 2.  Worsening right lung airspace disease.  Original Report Authenticated By: Osa Craver, M.D.   Dg Chest Port 1 View  03/18/2011  *RADIOLOGY REPORT*  Clinical Data: Pneumonia.  PORTABLE CHEST - 1 VIEW  Comparison: 03/17/2011  Findings: Tracheostomy tube remains in place, and severe elevation of right hemidiaphragm is again demonstrated.  Airspace disease in the central right upper lobe shows  mild worsening since previous exam.  No evidence of pleural effusion. Heart size and mediastinal contours appear stable.  IMPRESSION:  1.  Increased airspace opacity in the central right upper lobe, suspicious for pneumonia. 2.  Chronic elevation of the right hemidiaphragm.  Original Report Authenticated By: Danae Orleans, M.D.    Assessment and Plan:  Acute on chronic respiratory failure r/t pneumonia in setting of long term tracheostomy. Likely aspiration.  Penicillin / Cephalosporin allergy.  Failed trach collar trial 3/2.  Air leak improved with cuffed ETT/trach  PLAN -  -ENT following, fashioned cuffed trach from #6 ETT, awaiting adjustable cuffed trach, should be getting from [facility] today -BD -f/u CXR    Pneumonia -  CAP v Aspiration. Sputum with normal flora, 1/2 BC with GPR. PLAN -  Cont broad spectrum abx (pcn, cephalosporin allergy)  Narrow quickly as able  Cerebral palsy.  History of seizures.  No active seizures. PLAN -  -Continue preadmission medications   GERD, dysphagia, s/p PEG PLAN -  -TF via PEG - will transition to continuous rather than bolus while in ICU -Protonix   Thrombocytopenia- etiology unknown.  No active hemorrhage.  Lab 03/20/11 0447 03/19/11 0500 03/18/11 0700 03/17/11 1050  PLT 64* 54* 63* 76*  PLAN -  -Avoid heparin -Trend platelets   Best practices / Disposition: -->Full Code -->DVT Px: scd's -->GI Px: protonix -->Diet: PEG feeding  WHITEHEART,KATHRYN, NP 03/20/2011  9:03 AM Pager: [phone]  Reviewed above, examined pt, and agree with assessment/plan.  Updated family at bedside.  Continue current Abx regimen.  ENT to change trach later today.  Critical care time 35 minutes.  Coralyn Helling, MD 03/20/2011, 11:26 AM Pager:  [phone]

## 2011-03-20 NOTE — Progress Notes (Signed)
ANTIBIOTIC CONSULT NOTE - FOLLOW UP  Pharmacy Consult for vancomycin Indication: PNA  Allergies  Allergen Reactions  . Amoxicillin Other (See Comments)    Yeast Infection   . Morphine And Related Other (See Comments)    Unknown  . Ceftazidime Rash  . Fortaz (Ceftazidime Sodium In D5w) Rash    Patient Measurements: Height: 5\' 5"  (165.1 cm) Weight: 99 lb 10.4 oz (45.2 kg) IBW/kg (Calculated) : 57  Adjusted Body Weight:   Vital Signs: Temp: 101.7 F (38.7 C) (03/03 0739) Temp src: Oral (03/03 0739) BP: 118/95 mmHg (03/03 0700) Pulse Rate: 125  (03/03 0700) Intake/Output from previous day: 03/02 0701 - 03/03 0700 In: 3320 [I.V.:1050; NG/GT:480; IV Piggyback:1150] Out: -  Intake/Output from this shift:    Labs:  Basename 03/20/11 0447 03/19/11 0500 03/18/11 0700  WBC 4.6 5.2 5.1  HGB 7.8* 8.8* 10.0*  PLT 64* 54* 63*  LABCREA -- -- --  CREATININE 0.24* 0.21* 0.26*   Estimated Creatinine Clearance: 75.4 ml/min (by C-G formula based on Cr of 0.24).  Basename 03/20/11 0630  VANCOTROUGH 5.2*  VANCOPEAK --  Drue Dun --  GENTTROUGH --  GENTPEAK --  GENTRANDOM --  TOBRATROUGH --  TOBRAPEAK --  TOBRARND --  AMIKACINPEAK --  AMIKACINTROU --  AMIKACIN --     Microbiology: Recent Results (from the past 720 hour(s))  CULTURE, BLOOD (ROUTINE X 2)     Status: Normal (Preliminary result)   Collection Time   03/17/11 10:16 AM      Component Value Range Status Comment   Specimen Description BLOOD FEMORAL ARTERY COLLECTED BY DOCTOR   Final    Special Requests BOTTLES DRAWN AEROBIC AND ANAEROBIC 5CC   Final    Culture  Setup Time [id number]   Final    Culture     Final    Value: GRAM POSITIVE RODS     Note: Gram Stain Report Called to,Read Back By and Verified With: HAIRSTON M ON 03/18/11 AT 2040 BY LOY C Performed at [facility]   Report Status PENDING   Incomplete   MRSA PCR SCREENING     Status: Abnormal   Collection Time   03/17/11  2:28 PM   Component Value Range Status Comment   MRSA by PCR POSITIVE (*) NEGATIVE  Final   CULTURE, BLOOD (ROUTINE X 2)     Status: Normal (Preliminary result)   Collection Time   03/17/11  4:00 PM      Component Value Range Status Comment   Specimen Description BLOOD RIGHT HAND   Final    Special Requests BOTTLES DRAWN AEROBIC AND ANAEROBIC 6CC   Final    Culture NO GROWTH 3 DAYS   Final    Report Status PENDING   Incomplete   CULTURE, SPUTUM-ASSESSMENT     Status: Normal   Collection Time   03/17/11  7:06 PM      Component Value Range Status Comment   Specimen Description TRACHEAL ASPIRATE   Final    Special Requests NONE   Final    Sputum evaluation     Final    Value: THIS SPECIMEN IS ACCEPTABLE. RESPIRATORY CULTURE REPORT TO FOLLOW.     Performed at [facility]   Report Status 03/17/2011 FINAL   Final   CULTURE, RESPIRATORY     Status: Normal (Preliminary result)   Collection Time   03/17/11  7:06 PM      Component Value Range Status Comment   Specimen Description TRACHEAL ASPIRATE  Final    Special Requests NONE   Final    Gram Stain     Final    Value: MODERATE WBC PRESENT,BOTH PMN AND MONONUCLEAR     FEW SQUAMOUS EPITHELIAL CELLS PRESENT     RARE GRAM POSITIVE COCCI IN PAIRS     RARE GRAM POSITIVE RODS   Culture Non-Pathogenic Oropharyngeal-type Flora Isolated.   Final    Report Status PENDING   Incomplete     Anti-infectives     Start     Dose/Rate Route Frequency Ordered Stop   03/19/11 1630   aztreonam (AZACTAM) 2 g in dextrose 5 % 50 mL IVPB        2 g 100 mL/hr over 30 Minutes Intravenous 3 times per day 03/19/11 1535     03/18/11 2000   aztreonam (AZACTAM) 2 g in dextrose 5 % 50 mL IVPB  Status:  Discontinued        2 g 100 mL/hr over 30 Minutes Intravenous 3 times per day 03/18/11 1819 03/19/11 1024   03/18/11 1745   metroNIDAZOLE (FLAGYL) IVPB 500 mg        500 mg 100 mL/hr over 60 Minutes Intravenous Every 8 hours 03/18/11 1745     03/18/11 1400    Levofloxacin (LEVAQUIN) IVPB 750 mg        750 mg 100 mL/hr over 90 Minutes Intravenous Every 24 hours 03/17/11 2201     03/18/11 1245   clindamycin (CLEOCIN) IVPB 600 mg  Status:  Discontinued        600 mg 100 mL/hr over 30 Minutes Intravenous 3 times per day 03/18/11 1237 03/18/11 1744   03/18/11 0800   vancomycin (VANCOCIN) 750 mg in sodium chloride 0.9 % 150 mL IVPB        750 mg 150 mL/hr over 60 Minutes Intravenous Every 8 hours 03/17/11 2211     03/17/11 2300   vancomycin (VANCOCIN) IVPB 1000 mg/200 mL premix        1,000 mg 200 mL/hr over 60 Minutes Intravenous  Once 03/17/11 2211 03/18/11 0005   03/17/11 1400   levofloxacin (LEVAQUIN) IVPB 500 mg  Status:  Discontinued        500 mg 100 mL/hr over 60 Minutes Intravenous Every 24 hours 03/17/11 1346 03/17/11 2201   03/17/11 1345   azithromycin (ZITHROMAX) 500 mg in dextrose 5 % 250 mL IVPB  Status:  Discontinued        500 mg 250 mL/hr over 60 Minutes Intravenous Every 24 hours 03/17/11 1345 03/17/11 2201   03/17/11 1145   moxifloxacin (AVELOX) IVPB 400 mg        400 mg 250 mL/hr over 60 Minutes Intravenous  Once 03/17/11 1142 03/17/11 1257          Assessment: Patient is a 26 y.o F with cerebral palsy currently on aztreonam day#3, flagyl day#3, vancomycin day#4, and levaquin day#4 or suspected PNA.  Steady state vancomycin trough level now back subtherapeutic at 5.2  Goal of Therapy:  Vancomycin trough level 15-20 mcg/ml  Plan:  1) Increase vancomycin to 1250mg  IV q8h 2) Recheck level at steady state 3) No change in dose for other abx  [name] P 03/20/2011,8:09 AM

## 2011-03-20 NOTE — Progress Notes (Signed)
Patient ID: [name], female   DOB: October 08, 1985, 26 y.o.   MRN: [id number] An adjustable length trach was brought in from [facility] today. I was able to replace the trach and used the fiberoptic scope to assure the tip was well above the carina. The outermost edge of the trach connector was in ideal position at 9 cm. She tolerated this well. Recommend keep this tube in for at least one month and then change to a similar one.

## 2011-03-21 ENCOUNTER — Inpatient Hospital Stay (HOSPITAL_COMMUNITY): Payer: Medicare Other

## 2011-03-21 DIAGNOSIS — J9819 Other pulmonary collapse: Secondary | ICD-10-CM

## 2011-03-21 DIAGNOSIS — Z9911 Dependence on respirator [ventilator] status: Secondary | ICD-10-CM

## 2011-03-21 LAB — BASIC METABOLIC PANEL
BUN: 6 mg/dL (ref 6–23)
CO2: 33 mEq/L — ABNORMAL HIGH (ref 19–32)
Calcium: 8 mg/dL — ABNORMAL LOW (ref 8.4–10.5)
Creatinine, Ser: 0.2 mg/dL — ABNORMAL LOW (ref 0.50–1.10)
Glucose, Bld: 125 mg/dL — ABNORMAL HIGH (ref 70–99)

## 2011-03-21 LAB — CBC
HCT: 24.1 % — ABNORMAL LOW (ref 36.0–46.0)
Hemoglobin: 7.5 g/dL — ABNORMAL LOW (ref 12.0–15.0)
MCH: 26.4 pg (ref 26.0–34.0)
MCV: 84.9 fL (ref 78.0–100.0)
RBC: 2.84 MIL/uL — ABNORMAL LOW (ref 3.87–5.11)

## 2011-03-21 MED ORDER — VANCOMYCIN HCL 1000 MG IV SOLR
1250.0000 mg | Freq: Four times a day (QID) | INTRAVENOUS | Status: DC
  Administered 2011-03-21 – 2011-03-22 (×3): 1250 mg via INTRAVENOUS
  Filled 2011-03-21 (×5): qty 1250

## 2011-03-21 MED ORDER — CHLORHEXIDINE GLUCONATE 0.12 % MT SOLN
15.0000 mL | Freq: Two times a day (BID) | OROMUCOSAL | Status: DC
  Administered 2011-03-21 – 2011-04-04 (×28): 15 mL via OROMUCOSAL
  Filled 2011-03-21 (×28): qty 15

## 2011-03-21 MED ORDER — BIOTENE DRY MOUTH MT LIQD
15.0000 mL | Freq: Four times a day (QID) | OROMUCOSAL | Status: DC
  Administered 2011-03-21 – 2011-04-04 (×56): 15 mL via OROMUCOSAL

## 2011-03-21 NOTE — Progress Notes (Signed)
Name: [name] MRN: [id number] DOB: 1985/05/28    LOS: 4  PCCM FOLLOW UP NOTE   History of Present Illness:  26 y/o F with PMH of cerebral palsy requiring 24 hour care, epilepsy, reflux esophagitis s/p feeding tube and chronic trach admitted to [facility] on 2/28 with complaints of fever, increased suctioning, increase in O2 requirement and chest congestion.  CXR demonstrated R sided infiltrate concerning for PNA.  Pulmonologist off for weekend at [facility] and patient transferred to [facility] 3/1 for further care.    Lines / Drains: Pediatric cuffless trach (pta)>>>3/1 #4 cuffless 3/1>>>3/2     #6 ETT/trach 3/2 >> 3/3 Bivona trach 3/3 >>  RUE PICC 3/3 >>  Cultures: 2/28 MRSA PCR >> POS 2/28 Blood>>> 1/2 Diphtheroids (contaminant) 2/28 Sputum >> normal flora   Antibiotics:   2/28 Clinda>>>3/1 3/1  Aztreonam (Pseudomonas)>>> 3/4 3/1  Flagyl (Anaerobes)>>> 3/4 2/28 Vanco >> 2/28 Levaquin >>   Tests / Events: 3/1  Transferred from [facility] with pneumonia   Vital Signs: Filed Vitals:   03/21/11 1000 03/21/11 1100 03/21/11 1142 03/21/11 1200  BP: 97/61 95/53  108/61  Pulse: 104 97  101  Temp:   99.5 F (37.5 C)   TempSrc:   Oral   Resp: 24 21  25   Height:      Weight:      SpO2: 100% 100%  97%    Intake/Output Summary (Last 24 hours) at 03/21/11 1213 Last data filed at 03/21/11 1100  Gross per 24 hour  Intake   4027 ml  Output      0 ml  Net   4027 ml    Physical Examination: General: chronically ill, cerebral palsy Neuro: sleeping, arousable, CV: s1s2 tachy, regular PULM: resp's shallow,non labored on PS 12/5, lungs bilaterally coarse rhonchi, diminished on left GI: round / soft, bsx4 active, PEG Extremities: Warm/dry, contracted   Ventilator settings: Vent Mode:  [-] PSV;CPAP FiO2 (%):  [30 %-40 %] 30 % PEEP:  [5 cmH20] 5 cmH20 Pressure Support:  [12 cmH20] 12 cmH20  Labs    CBC  Lab 03/21/11 0500 03/20/11 0447 03/19/11 0500  HGB 7.5* 7.8* 8.8*  HCT  24.1* 24.9* 28.0*  WBC 4.8 4.6 5.2  PLT 66* 64* 54*   BMET  Lab 03/21/11 0500 03/20/11 0447 03/19/11 0500 03/18/11 0700 03/17/11 1012  NA 134* 132* 136 135 128*  K 3.6 3.1* -- -- --  CL 97 96 97 98 92*  CO2 33* 30 31 31 30   GLUCOSE 125* 114* 95 100* 92  BUN 6 7 6  4* 6  CREATININE 0.20* 0.24* 0.21* 0.26* 0.33*  CALCIUM 8.0* 7.9* 8.0* 8.5 8.3*  MG -- -- 2.0 -- --  PHOS -- -- 3.3 -- --   Radiology: CXR: RLL Atx  Assessment and Plan:  Acute on chronic respiratory failure r/t pneumonia in setting of long term tracheostomy. Likely aspiration.  Penicillin / Cephalosporin allergy.  Failed trach collar trial 3/2.  PLAN -  -now with Bivona trach. Copious secretions. Not weanable presently  Pneumonia - CAP v Aspiration. Sputum with normal flora, 1/2 BC with GPR. PLAN -  Narrow abx to Vanc + Levoflox  Cerebral palsy.  History of seizures.  No active seizures. PLAN -  -Continue preadmission medications   GERD, dysphagia, s/p PEG PLAN -  -TF via PEG - will transition to continuous rather than bolus while in ICU -Protonix   Thrombocytopenia- etiology unknown.  No active hemorrhage.  Lab  03/21/11 0500 03/20/11 0447 03/19/11 0500 03/18/11 0700 03/17/11 1050  PLT 66* 64* 54* 63* 76*  PLAN -  -Avoid heparin -Trend platelets   Best practices / Disposition: -->Full Code -->DVT Px: scd's -->GI Px: protonix -->Diet: PEG feeding   CCM time 35 mins   Billy Fischer, MD;  PCCM service; [place] [phone]

## 2011-03-21 NOTE — Progress Notes (Signed)
UR completed. Wilmoth Rasnic Crowder 03/21/2011 [phone] 

## 2011-03-21 NOTE — Progress Notes (Signed)
ANTIBIOTIC CONSULT NOTE - FOLLOW UP  Pharmacy Consult for vancomycin Indication: PNA  Allergies  Allergen Reactions  . Amoxicillin Other (See Comments)    Yeast Infection   . Morphine And Related Other (See Comments)    Unknown  . Ceftazidime Rash    Patient Measurements: Height: 5\' 5"  (165.1 cm) Weight: 99 lb 10.4 oz (45.2 kg) IBW/kg (Calculated) : 57   Vital Signs: Temp: 99.5 F (37.5 C) (03/04 1600) Temp src: Oral (03/04 1600) BP: 108/59 mmHg (03/04 1600) Pulse Rate: 101  (03/04 1600) Intake/Output from previous day: 03/03 0701 - 03/04 0700 In: 4327 [I.V.:1050; NG/GT:930; IV Piggyback:1267] Out: -  Intake/Output from this shift: Total I/O In: 1835 [I.V.:350; Other:495; NG/GT:190; IV Piggyback:800] Out: -   Labs:  Basename 03/21/11 0500 03/20/11 0447 03/19/11 0500  WBC 4.8 4.6 5.2  HGB 7.5* 7.8* 8.8*  PLT 66* 64* 54*  LABCREA -- -- --  CREATININE 0.20* 0.24* 0.21*   Estimated Creatinine Clearance: 75.4 ml/min (by C-G formula based on Cr of 0.2).  Basename 03/21/11 1600 03/20/11 0630  VANCOTROUGH 11.4 5.2*  VANCOPEAK -- --  Drue Dun -- --  GENTTROUGH -- --  GENTPEAK -- --  GENTRANDOM -- --  TOBRATROUGH -- --  TOBRAPEAK -- --  TOBRARND -- --  AMIKACINPEAK -- --  AMIKACINTROU -- --  AMIKACIN -- --     Microbiology: Recent Results (from the past 720 hour(s))  CULTURE, BLOOD (ROUTINE X 2)     Status: Normal   Collection Time   03/17/11 10:16 AM      Component Value Range Status Comment   Specimen Description BLOOD FEMORAL ARTERY COLLECTED BY DOCTOR   Final    Special Requests BOTTLES DRAWN AEROBIC AND ANAEROBIC 5CC   Final    Culture  Setup Time [id number]   Final    Culture     Final    Value: DIPHTHEROIDS(CORYNEBACTERIUM SPECIES)     Note: Standardized susceptibility testing for this organism is not available.     Note: Gram Stain Report Called to,Read Back By and Verified With: HAIRSTON M ON 03/18/11 AT 2040 BY LOY C Performed at [facility]   Report Status 03/20/2011 FINAL   Final   MRSA PCR SCREENING     Status: Abnormal   Collection Time   03/17/11  2:28 PM      Component Value Range Status Comment   MRSA by PCR POSITIVE (*) NEGATIVE  Final   CULTURE, BLOOD (ROUTINE X 2)     Status: Normal (Preliminary result)   Collection Time   03/17/11  4:00 PM      Component Value Range Status Comment   Specimen Description BLOOD RIGHT HAND   Final    Special Requests BOTTLES DRAWN AEROBIC AND ANAEROBIC 6CC   Final    Culture NO GROWTH 4 DAYS   Final    Report Status PENDING   Incomplete   CULTURE, SPUTUM-ASSESSMENT     Status: Normal   Collection Time   03/17/11  7:06 PM      Component Value Range Status Comment   Specimen Description TRACHEAL ASPIRATE   Final    Special Requests NONE   Final    Sputum evaluation     Final    Value: THIS SPECIMEN IS ACCEPTABLE. RESPIRATORY CULTURE REPORT TO FOLLOW.     Performed at [facility]   Report Status 03/17/2011 FINAL   Final   CULTURE, RESPIRATORY     Status: Normal  Collection Time   03/17/11  7:06 PM      Component Value Range Status Comment   Specimen Description TRACHEAL ASPIRATE   Final    Special Requests NONE   Final    Gram Stain     Final    Value: MODERATE WBC PRESENT,BOTH PMN AND MONONUCLEAR     FEW SQUAMOUS EPITHELIAL CELLS PRESENT     RARE GRAM POSITIVE COCCI IN PAIRS     RARE GRAM POSITIVE RODS   Culture Non-Pathogenic Oropharyngeal-type Flora Isolated.   Final    Report Status 03/20/2011 FINAL   Final     Anti-infectives     Start     Dose/Rate Route Frequency Ordered Stop   03/21/11 2200   vancomycin (VANCOCIN) 1,250 mg in sodium chloride 0.9 % 250 mL IVPB        1,250 mg 166.7 mL/hr over 90 Minutes Intravenous Every 6 hours 03/21/11 1736     03/20/11 0900   vancomycin (VANCOCIN) 1,250 mg in sodium chloride 0.9 % 250 mL IVPB  Status:  Discontinued        1,250 mg 166.7 mL/hr over 90 Minutes Intravenous Every 8 hours 03/20/11 0836 03/21/11  1736   03/19/11 1630   aztreonam (AZACTAM) 2 g in dextrose 5 % 50 mL IVPB  Status:  Discontinued        2 g 100 mL/hr over 30 Minutes Intravenous 3 times per day 03/19/11 1535 03/21/11 1423   03/18/11 2000   aztreonam (AZACTAM) 2 g in dextrose 5 % 50 mL IVPB  Status:  Discontinued        2 g 100 mL/hr over 30 Minutes Intravenous 3 times per day 03/18/11 1819 03/19/11 1024   03/18/11 1745   metroNIDAZOLE (FLAGYL) IVPB 500 mg  Status:  Discontinued        500 mg 100 mL/hr over 60 Minutes Intravenous Every 8 hours 03/18/11 1745 03/21/11 1423   03/18/11 1400   Levofloxacin (LEVAQUIN) IVPB 750 mg        750 mg 100 mL/hr over 90 Minutes Intravenous Every 24 hours 03/17/11 2201     03/18/11 1245   clindamycin (CLEOCIN) IVPB 600 mg  Status:  Discontinued        600 mg 100 mL/hr over 30 Minutes Intravenous 3 times per day 03/18/11 1237 03/18/11 1744   03/18/11 0800   vancomycin (VANCOCIN) 750 mg in sodium chloride 0.9 % 150 mL IVPB  Status:  Discontinued        750 mg 150 mL/hr over 60 Minutes Intravenous Every 8 hours 03/17/11 2211 03/20/11 0833   03/17/11 2300   vancomycin (VANCOCIN) IVPB 1000 mg/200 mL premix        1,000 mg 200 mL/hr over 60 Minutes Intravenous  Once 03/17/11 2211 03/18/11 0005   03/17/11 1400   levofloxacin (LEVAQUIN) IVPB 500 mg  Status:  Discontinued        500 mg 100 mL/hr over 60 Minutes Intravenous Every 24 hours 03/17/11 1346 03/17/11 2201   03/17/11 1345   azithromycin (ZITHROMAX) 500 mg in dextrose 5 % 250 mL IVPB  Status:  Discontinued        500 mg 250 mL/hr over 60 Minutes Intravenous Every 24 hours 03/17/11 1345 03/17/11 2201   03/17/11 1145   moxifloxacin (AVELOX) IVPB 400 mg        400 mg 250 mL/hr over 60 Minutes Intravenous  Once 03/17/11 1142 03/17/11 1257  Assessment: Patient is a 26 y.o F with cerebral palsy currently on aztreonam day#4, flagyl day#4, vancomycin day#5, and levaquin day#5 or suspected PNA.   Vancomycin dose  increased yesterday for subtherapeutic level.  Repeat trough at new steady state still subtherapeutic.  Will further increase dose using PK data to target trough >15.  Goal of Therapy:  Vancomycin trough level 15-20 mcg/ml  Plan:  1) Increase vancomycin to 1250mg  IV q6h 2) Recheck level at steady state 3) No change in dose for other abx  Elson Clan 03/21/2011,5:37 PM

## 2011-03-21 NOTE — Progress Notes (Signed)
UR Completed.  Donevin Sainsbury Jane [phone] 03/21/2011  

## 2011-03-21 NOTE — Progress Notes (Signed)
PHARMACY - CRITICAL CARE PROGRESS NOTE  Pharmacy Consult for Aztreonam/Vancomycin/Levaquin/Metronidazole Indication: PNA   Allergies  Allergen Reactions  . Amoxicillin Other (See Comments)    Yeast Infection   . Morphine And Related Other (See Comments)    Unknown  . Ceftazidime Rash    Patient Measurements: Height: 5\' 5"  (165.1 cm) Weight: 99 lb 10.4 oz (45.2 kg) IBW/kg (Calculated) : 57   Vital Signs: Temp: 99.5 F (37.5 C) (03/04 1142) Temp src: Oral (03/04 1142) BP: 95/53 mmHg (03/04 1100) Pulse Rate: 97  (03/04 1100) Intake/Output from previous day: 03/03 0701 - 03/04 0700 In: 4327 [I.V.:1050; NG/GT:930; IV Piggyback:1267] Out: -  Intake/Output from this shift: Total I/O In: 785 [I.V.:150; ZOXWR:604; NG/GT:160; IV Piggyback:250] Out: -  Vent settings for last 24 hours: Vent Mode:  [-] PSV FiO2 (%):  [40 %] 40 % PEEP:  [5 cmH20] 5 cmH20 Pressure Support:  [12 cmH20] 12 cmH20  Labs:  Basename 03/21/11 0500 03/20/11 0447 03/19/11 0500  WBC 4.8 4.6 5.2  HGB 7.5* 7.8* 8.8*  HCT 24.1* 24.9* 28.0*  PLT 66* 64* 54*  APTT -- -- --  INR -- -- --  CREATININE 0.20* 0.24* 0.21*  LABCREA -- -- --  CREATININE 0.20* 0.24* 0.21*  LABCREA -- -- --  CREAT24HRUR -- -- --  MG -- -- 2.0  PHOS -- -- 3.3  ALBUMIN -- -- --  PROT -- -- --  AST -- -- --  ALT -- -- --  ALKPHOS -- -- --  BILITOT -- -- --  BILIDIR -- -- --  IBILI -- -- --   Estimated Creatinine Clearance: 75.4 ml/min (by C-G formula based on Cr of 0.2).   Basename 03/18/11 1718  GLUCAP 87    Microbiology: Recent Results (from the past 720 hour(s))  CULTURE, BLOOD (ROUTINE X 2)     Status: Normal   Collection Time   03/17/11 10:16 AM      Component Value Range Status Comment   Specimen Description BLOOD FEMORAL ARTERY COLLECTED BY DOCTOR   Final    Special Requests BOTTLES DRAWN AEROBIC AND ANAEROBIC 5CC   Final    Culture  Setup Time [id number]   Final    Culture     Final    Value:  DIPHTHEROIDS(CORYNEBACTERIUM SPECIES)     Note: Standardized susceptibility testing for this organism is not available.     Note: Gram Stain Report Called to,Read Back By and Verified With: HAIRSTON M ON 03/18/11 AT 2040 BY LOY C Performed at [facility]   Report Status 03/20/2011 FINAL   Final   MRSA PCR SCREENING     Status: Abnormal   Collection Time   03/17/11  2:28 PM      Component Value Range Status Comment   MRSA by PCR POSITIVE (*) NEGATIVE  Final   CULTURE, BLOOD (ROUTINE X 2)     Status: Normal (Preliminary result)   Collection Time   03/17/11  4:00 PM      Component Value Range Status Comment   Specimen Description BLOOD RIGHT HAND   Final    Special Requests BOTTLES DRAWN AEROBIC AND ANAEROBIC 6CC   Final    Culture NO GROWTH 4 DAYS   Final    Report Status PENDING   Incomplete   CULTURE, SPUTUM-ASSESSMENT     Status: Normal   Collection Time   03/17/11  7:06 PM      Component Value Range Status Comment   Specimen Description  TRACHEAL ASPIRATE   Final    Special Requests NONE   Final    Sputum evaluation     Final    Value: THIS SPECIMEN IS ACCEPTABLE. RESPIRATORY CULTURE REPORT TO FOLLOW.     Performed at [facility]   Report Status 03/17/2011 FINAL   Final   CULTURE, RESPIRATORY     Status: Normal   Collection Time   03/17/11  7:06 PM      Component Value Range Status Comment   Specimen Description TRACHEAL ASPIRATE   Final    Special Requests NONE   Final    Gram Stain     Final    Value: MODERATE WBC PRESENT,BOTH PMN AND MONONUCLEAR     FEW SQUAMOUS EPITHELIAL CELLS PRESENT     RARE GRAM POSITIVE COCCI IN PAIRS     RARE GRAM POSITIVE RODS   Culture Non-Pathogenic Oropharyngeal-type Flora Isolated.   Final    Report Status 03/20/2011 FINAL   Final     Medications:  Scheduled:    . acyclovir cream   Topical QID  . aztreonam  2 g Intravenous Q8H  . baclofen  20 mg Per Tube QID  . carbamazepine  200 mg Oral Daily   And  . carbamazepine  300 mg  Oral Custom  . chlorhexidine  15 mL Mouth/Throat QID  . Chlorhexidine Gluconate Cloth  6 each Topical Q0600  . feeding supplement (JEVITY 1.2)  1,000 mL Per Tube Q24H  . fluticasone  2 spray Each Nare Daily  . free water  100 mL Per Tube QID  . glycopyrrolate  2.5 mg Per Tube BID  . levalbuterol  6 puff Inhalation Q6H  . levofloxacin (LEVAQUIN) IV  750 mg Intravenous Q24H  . metronidazole  500 mg Intravenous Q8H  . mupirocin ointment  1 application Nasal BID  . pantoprazole sodium  40 mg Per Tube Daily  . polyethylene glycol  17 g Per Tube Daily  . potassium chloride  40 mEq Per Tube Q6H  . silver sulfADIAZINE  1 application Topical Daily  . Valproic Acid  625 mg Per Tube TID  . vancomycin  1,250 mg Intravenous Q8H  . DISCONTD: levalbuterol  0.63 mg Nebulization Q6H   Infusions:    . sodium chloride 50 mL/hr at 03/21/11 0321  . propofol     PRN: acetaminophen, antiseptic oral rinse, diazepam, guaiFENesin, hydrocortisone cream, ibuprofen, levalbuterol, mupirocin ointment, nystatin ointment, sodium chloride, sodium phosphate, sorbitol, zolpidem, DISCONTD: albuterol  Assessment: Admit Complaint: 26 y/o F with PMH of cerebral palsy requiring 24 hour care, epilepsy, reflux esophagitis s/p feeding tube and chronic trach admitted to [facility] on 2/28 with complaints of fever, increased suctioning, increase in O2 requirement and chest congestion.  Pharmacy Review: Anticoagulation: on hep 5K sq q8, am dose refused by Mom, now SCDs only 2nd thrombocytopenia Infectious Disease: CAP: D4 Aztreonam/Flagyl; D5 Vanc/Levaquin (on abx for suspected PNA);Tmax 102 (now 99.5), wbc wnl; (2/28) Resp cx-NPF, (2/28) Bcx 1/2 on 2/28 diphtheroids - likely contaminant. MRSA PCR (+), Scr low d/t debilitation, no UOP recorded (Mom refuses Foley catheter, but nurse states that urine output is frequent (~Q2hours) and good. Vanc trough 5.2 on 3/3. Cardiovascular: BP low normal, HR up-no  meds Gastrointestinal / Nutrition: ileus, reflux esophagitis, has PEG for feeding and meds, protonix PT; on jevity; glycopyrrolate PT for secretions. LBM 3/4. On miralax daily, sorbitol PRN, and sodium phosphate enema PRN.  Neurology: cerebral palsy, epilepsy, on tegretol, depakote, Valproic acid  level 71.1 ok; free carbamazepine level ordered- Send out. On home doses. Diastat prn, baclofen 20mg  PT QID for spasticity. Propofol continuous infusion ordered. Nephrology: renal fx appears stable, Scr is falsely low d/t debilitation. No UOP recorded (Mom refuses Foley catheter but nurse states that urine output seems good); K 3.6, IVF-NS @ 50, Free water PT @ 100 QID- Na 134 Pulmonary: CAP vs aspiration PNA; failed trach collar, now with cuffless trach (connector falling out of endotracheal tube, will plan on replacing tube on Tuesday when part comes in); now on vent PSV;CPAP FIO2 down to 30; xopenex, fluticasone Hematology / Oncology: thrombocytopenia (plt trending up), hgb trending down PTA Meds: Alprazolam PRN not yet resumed. Best Practice: MC, SCDs, PPI  Goal of Therapy:  Afebrile, Vanc trough 15-20.   Plan:  Continue vancomycin 1250mg  IV Q8. Recheck level at steady state today at 1630.   Delphia Grates, PharmD Candidate 03/21/2011,11:43 AM   I agree with above. Current antibiotics are Vancomycin/Levaquin. Aztreonam/Flagyl stopped today (3/4).   Recommendation to MD: Patient on free water with low, normal sodium level? - Consider holding free water.  Thanks, Link Snuffer, PharmD, BCPS Clinical Pharmacist [phone] 03/21/2011,2:47 PM

## 2011-03-22 DIAGNOSIS — Z9911 Dependence on respirator [ventilator] status: Secondary | ICD-10-CM

## 2011-03-22 DIAGNOSIS — J9819 Other pulmonary collapse: Secondary | ICD-10-CM

## 2011-03-22 DIAGNOSIS — J159 Unspecified bacterial pneumonia: Secondary | ICD-10-CM

## 2011-03-22 DIAGNOSIS — J9600 Acute respiratory failure, unspecified whether with hypoxia or hypercapnia: Secondary | ICD-10-CM

## 2011-03-22 LAB — CULTURE, BLOOD (ROUTINE X 2): Culture: NO GROWTH

## 2011-03-22 MED ORDER — ZOLPIDEM TARTRATE 5 MG PO TABS
5.0000 mg | ORAL_TABLET | Freq: Every evening | ORAL | Status: DC | PRN
  Administered 2011-03-22 – 2011-03-25 (×2): 5 mg
  Filled 2011-03-22 (×2): qty 1

## 2011-03-22 MED ORDER — ZOLPIDEM TARTRATE 5 MG PO TABS
5.0000 mg | ORAL_TABLET | Freq: Once | ORAL | Status: AC
  Administered 2011-03-22: 5 mg
  Filled 2011-03-22: qty 1

## 2011-03-22 MED ORDER — FREE WATER
100.0000 mL | Freq: Two times a day (BID) | Status: DC
  Administered 2011-03-22 – 2011-04-04 (×24): 100 mL

## 2011-03-22 NOTE — Progress Notes (Signed)
Clinical Social Worker completed psychosocial assessment, please see shadow chart for details.  Of note, pt is currently linked up with [facility] who provides 18 hour/day care to pt.  Pt resides with mother.  CSW to continue to follow and provide emotional support as needed.   Angelia Mould, MSW, [place] [phone]

## 2011-03-22 NOTE — Progress Notes (Signed)
   CARE MANAGEMENT NOTE 03/22/2011  Patient:  [name], [name]   Account Number:  [id number]  Date Initiated:  03/21/2011  Documentation initiated by:  [facility]  Subjective/Objective Assessment:   Admitted with pneumonia - from home - trach;   24 hour assistance - lives with mother.     Action/Plan:   PTA, PT LIVES WITH MOTHER, HAS NURSING PROVIDED BY [facility]--18HRS/DAY MON-FRI; 14HRS/DAY SAT AND SUN.   Anticipated DC Date:  03/28/2011   Anticipated DC Plan:  HOME W HOME HEALTH SERVICES      DC Planning Services  CM consult      [facility] Choice  HOME HEALTH   Choice offered to / List presented to:             [facility] agency  [facility] Care   Status of service:  In process, will continue to follow Medicare Important Message given?   (If response is "NO", the following Medicare IM given date fields will be blank) Date Medicare IM given:   Date Additional Medicare IM given:    Discharge Disposition:    Per UR Regulation:  Reviewed for med. necessity/level of care/duration of stay  Comments:  03/22/11 Labrenda Lasky,RN,BSN 1230 MET WITH PT'S MOTHER, CSW, AND [facility]PRESENTATIV[facility] X2 TO DISCUSS DC PLAN.  MOTHER PROVIDES CARE WITH ASSISTANCE OF HOME NURSING, PER SCHEDULE ABOVE.  WILL FOLLOW PROGRESSION, FAX DISCHARGE INSTRUCTIONS AND ORDERS TO [facility] WHEN AVAILABLE, ATTN: ANGIE (PT'S NURSE). Phone #[phone]

## 2011-03-22 NOTE — Progress Notes (Signed)
Name: [name] MRN: [id number] DOB: May 14, 1985    LOS: 5  PCCM FOLLOW UP NOTE   History of Present Illness:  26 y/o F with PMH of cerebral palsy requiring 24 hour care, epilepsy, reflux esophagitis s/p feeding tube and chronic trach admitted to [facility] on 2/28 with fever, increased resp secretions, increased O2 requirement.  CXR demonstrated R sided infiltrate concerning for PNA.  Patient transferred to [facility] 3/1 for further care.    Lines / Drains: Pediatric cuffless trach (pta)>>>3/1 #4 cuffless 3/1>>>3/2     #6 ETT/trach 3/2 >> 3/3 Bivona trach 3/3 >>  RUE PICC 3/3 >>  Cultures: 2/28 MRSA PCR >> POS 2/28 Blood>>> 1/2 Diphtheroids (contaminant) 2/28 Sputum >> normal flora   Antibiotics:   2/28 Clinda>>>3/1 3/1  Aztreonam (Pseudomonas)>>> 3/4 3/1  Flagyl (Anaerobes)>>> 3/4 2/28 Vanco >> 3/5 2/28 Levaquin >>   Tests / Events: 3/1  Transferred from [facility] with pneumonia   Vital Signs: Filed Vitals:   03/22/11 1117 03/22/11 1200 03/22/11 1300 03/22/11 1400  BP:  91/43 92/57 115/78  Pulse:  78 91 110  Temp: 98.9 F (37.2 C)     TempSrc: Oral     Resp:  24 22 28   Height:      Weight:      SpO2:  100% 100% 98%    Intake/Output Summary (Last 24 hours) at 03/22/11 1514 Last data filed at 03/22/11 1400  Gross per 24 hour  Intake   3596 ml  Output      0 ml  Net   3596 ml    Physical Examination: General: chronically ill, cerebral palsy contractures Neuro: sleeping, arousable  CV: s1s2 tachy, regular PULM: Scattered rhonchi, no wheeze GI: round / soft, bsx4 active, PEG Extremities: Warm/dry, contracted   Ventilator settings: Vent Mode:  [-] PSV;CPAP FiO2 (%):  [30 %] 30 % PEEP:  [5 cmH20] 5 cmH20 Pressure Support:  [12 cmH20] 12 cmH20  Labs    CBC  Lab 03/21/11 0500 03/20/11 0447 03/19/11 0500  HGB 7.5* 7.8* 8.8*  HCT 24.1* 24.9* 28.0*  WBC 4.8 4.6 5.2  PLT 66* 64* 54*   BMET  Lab 03/21/11 0500 03/20/11 0447 03/19/11 0500 03/18/11 0700  03/17/11 1012  NA 134* 132* 136 135 128*  K 3.6 3.1* -- -- --  CL 97 96 97 98 92*  CO2 33* 30 31 31 30   GLUCOSE 125* 114* 95 100* 92  BUN 6 7 6  4* 6  CREATININE 0.20* 0.24* 0.21* 0.26* 0.33*  CALCIUM 8.0* 7.9* 8.0* 8.5 8.3*  MG -- -- 2.0 -- --  PHOS -- -- 3.3 -- --   Radiology: CXR: NSC RLL Atx  Assessment and Plan:  Acute on chronic respiratory failure r/t pneumonia in setting of long term tracheostomy. Likely aspiration.  Penicillin / Cephalosporin allergy.  Failed trach collar trial 3/2.  PLAN -  -now with Bivona trach. Copious secretions. Weaning on PSV.  Pneumonia - CAP v Aspiration. Sputum with normal flora, 1/2 BC with GPR. PLAN -  Narrow abx to Levoflox alone  Cerebral palsy.  History of seizures.  No active seizures. PLAN -  -Continue preadmission medications   GERD, dysphagia, s/p PEG PLAN -  -Cont TFs -Cont Protonix   Thrombocytopenia- etiology unknown.  No active hemorrhage.  Lab 03/21/11 0500 03/20/11 0447 03/19/11 0500 03/18/11 0700 03/17/11 1050  PLT 66* 64* 54* 63* 76*  PLAN -  -Avoid heparin -Trend platelets   Best practices /  Disposition: -->Full Code -->DVT Px: scd's -->GI Px: protonix -->Diet: PEG feeding   CCM time 30 mins   Billy Fischer, MD;  PCCM service; Mobile [phone]

## 2011-03-22 NOTE — Progress Notes (Signed)
eLink Physician-Brief Progress Note Patient Name: [name] DOB: 1985/09/04 MRN: [id number]  Date of Service  03/22/2011   HPI/Events of Note  Home meds Ambien and Robinul d/ced today.  Unclear as to why.  Note that home meds were to be continued.     eICU Interventions  Plan: One time order for ambien. Review home meds in AM on rounds   Intervention Category Minor Interventions: Routine modifications to care plan (e.g. PRN medications for pain, fever)  Linah Klapper 03/22/2011, 12:04 AM

## 2011-03-23 ENCOUNTER — Inpatient Hospital Stay (HOSPITAL_COMMUNITY): Payer: Medicare Other

## 2011-03-23 DIAGNOSIS — J9819 Other pulmonary collapse: Secondary | ICD-10-CM

## 2011-03-23 DIAGNOSIS — J9600 Acute respiratory failure, unspecified whether with hypoxia or hypercapnia: Secondary | ICD-10-CM

## 2011-03-23 DIAGNOSIS — G40309 Generalized idiopathic epilepsy and epileptic syndromes, not intractable, without status epilepticus: Secondary | ICD-10-CM

## 2011-03-23 DIAGNOSIS — Z9911 Dependence on respirator [ventilator] status: Secondary | ICD-10-CM

## 2011-03-23 LAB — CARBAMAZEPINE, FREE AND TOTAL
Carbamazepine Metabolite -: 4.5 ug/mL — ABNORMAL HIGH (ref 0.2–2.0)
Carbamazepine, Bound: 5.6 ug/mL

## 2011-03-23 MED ORDER — SODIUM CHLORIDE 0.9 % IV SOLN
INTRAVENOUS | Status: DC
  Administered 2011-03-23: 18:00:00 via INTRAVENOUS
  Administered 2011-04-01 – 2011-04-03 (×2): 20 mL/h via INTRAVENOUS

## 2011-03-23 MED ORDER — JEVITY 1.2 CAL PO LIQD
1000.0000 mL | ORAL | Status: DC
  Filled 2011-03-23 (×3): qty 1000

## 2011-03-23 MED ORDER — GLYCOPYRROLATE NICU ORAL SYRINGE 0.2 MG/ML: 2.5000 mg | Freq: Two times a day (BID) | ORAL | Status: DC

## 2011-03-23 MED ORDER — METOCLOPRAMIDE HCL 5 MG/ML IJ SOLN
5.0000 mg | Freq: Once | INTRAMUSCULAR | Status: AC
  Administered 2011-03-23: 5 mg via INTRAVENOUS
  Filled 2011-03-23: qty 1

## 2011-03-23 MED ORDER — GLYCOPYRROLATE 1 MG PO TABS
2.5000 mg | ORAL_TABLET | Freq: Two times a day (BID) | ORAL | Status: DC
  Administered 2011-03-23 – 2011-04-04 (×24): 2.5 mg
  Filled 2011-03-23 (×28): qty 3

## 2011-03-23 NOTE — Progress Notes (Signed)
Clinical Child psychotherapist met with pt's mother at bedside.  CSW provided emotional support to mom.  CSW provided opportunity for mom to express difficult feelings.  CSW validated feelings associated with hospitalizations and chronic conditions.  CSW reviewed support system and coping skills.  CSW stressed the importance of caregivers practicing positive self care.  CSW to continue to follow and assist as needed.   Angelia Mould, MSW, [place] [phone]

## 2011-03-23 NOTE — Progress Notes (Signed)
eLink Physician-Brief Progress Note Patient Name: [name] DOB: November 22, 1985 MRN: [id number]  Date of Service  03/23/2011   HPI/Events of Note   Lab 03/21/11 0500 03/20/11 0447 03/19/11 0500 03/18/11 0700 03/17/11 1012  CREATININE 0.20* 0.24* 0.21* 0.26* 0.33*    Estimated Creatinine Clearance: 73.4 ml/min (by C-G formula based on Cr of 0.2).  RN calls saying sudden vomitting   eICU Interventions  reglan x 5mg  Iv and reasses   Intervention Category Major Interventions: Other:  Ashyia Schraeder 03/23/2011, 3:59 PM

## 2011-03-23 NOTE — Progress Notes (Signed)
Name: CONCHA SUDOL MRN: [id number] DOB: Oct 10, 1985    LOS: 6  PCCM FOLLOW UP NOTE   History of Present Illness:  26 y/o F with PMH of cerebral palsy requiring 24 hour care, epilepsy, reflux esophagitis s/p feeding tube and chronic trach admitted to [facility] on 2/28 with fever, increased resp secretions, increased O2 requirement.  CXR demonstrated R sided infiltrate concerning for PNA.  Patient transferred to [facility] 3/1 for further care.    Lines / Drains: Pediatric cuffless trach (pta)>>>3/1 #4 cuffless 3/1>>>3/2     #6 ETT/trach 3/2 >> 3/3 Bivona trach 3/3 >>  RUE PICC 3/3 >>  Cultures: 2/28 MRSA PCR >> POS 2/28 Blood>>> 1/2 Diphtheroids (contaminant) 2/28 Sputum >> normal flora   Antibiotics:   2/28 Clinda>>>3/1 3/1  Aztreonam (Pseudomonas)>>> 3/4 3/1  Flagyl (Anaerobes)>>> 3/4 2/28 Vanco >> 3/5 2/28 Levaquin >>   Tests / Events: 3/1  Transferred from [facility] with pneumonia   Vital Signs: Filed Vitals:   03/23/11 1100 03/23/11 1200 03/23/11 1240 03/23/11 1300  BP: 115/72 105/58 105/58 96/57  Pulse: 112 106 114 97  Temp:      TempSrc:      Resp: 41 28 30 25   Height:      Weight:      SpO2: 95% 95% 93% 95%    Intake/Output Summary (Last 24 hours) at 03/23/11 1430 Last data filed at 03/23/11 1300  Gross per 24 hour  Intake   1505 ml  Output      0 ml  Net   1505 ml    Physical Examination: General: chronically ill, cerebral palsy contractures Neuro: sleeping, arousable  CV: s1s2 tachy, regular PULM: Scattered rhonchi, no wheeze GI: round / soft, bsx4 active, PEG Extremities: Warm/dry, contracted   Ventilator settings: Vent Mode:  [-] PSV;CPAP FiO2 (%):  [30 %] 30 % PEEP:  [5 cmH20] 5 cmH20 Pressure Support:  [12 cmH20] 12 cmH20  Labs    CBC  BMET  Radiology: CXR: NSC low volumes, bilateral atx/infiltrates  Assessment and Plan:  Acute on chronic respiratory failure r/t pneumonia in setting of long term tracheostomy. Likely aspiration.   Penicillin / Cephalosporin allergy.  Failed trach collar trial 3/2.  PLAN -  -now with Bivona trach. Copious secretions. Weaning on PSV. -Begin chest percussion vest BID 3/6 -resume glycopyrrolate for copious thin secretions  Pneumonia - CAP v Aspiration. Sputum with normal flora, 1/2 BC with GPR. PLAN -  Cont Levofloxacin alone  Cerebral palsy.  History of seizures.  No active seizures. PLAN -  -Continue preadmission medications   GERD, dysphagia, s/p PEG PLAN -  -Cont TFs -Cont Protonix   Thrombocytopenia- etiology unknown.  No active hemorrhage.  Lab 03/21/11 0500 03/20/11 0447 03/19/11 0500 03/18/11 0700 03/17/11 1050  PLT 66* 64* 54* 63* 76*  PLAN -  -Avoid heparin -Trend platelets   Best practices / Disposition: -->Full Code -->DVT Px: scd's -->GI Px: protonix -->Diet: PEG feeding   Mother updated in detail. I explained that I have not seen much progress in the past 2-3 days. I introduced the concept of limiting care if it appears that the outcome is not going to be favorable. CCM time 45 mins   Billy Fischer, MD;  PCCM service; [place] [phone]

## 2011-03-23 NOTE — Progress Notes (Signed)
PHARMACY - CRITICAL CARE PROGRESS NOTE  Pharmacy Consult for CCM Monitoring. Indication: Medication Review.   Allergies  Allergen Reactions  . Amoxicillin Other (See Comments)    Yeast Infection   . Morphine And Related Other (See Comments)    Unknown  . Ceftazidime Rash    Patient Measurements: Height: 5\' 5"  (165.1 cm) Weight: 97 lb (44 kg) IBW/kg (Calculated) : 57   Vital Signs: Temp: 100.6 F (38.1 C) (03/06 0806) Temp src: Oral (03/06 0806) BP: 108/69 mmHg (03/06 1000) Pulse Rate: 96  (03/06 1000) Intake/Output from previous day: 03/05 0701 - 03/06 0700 In: 1900 [I.V.:100; NG/GT:570; IV Piggyback:150] Out: -  Intake/Output from this shift: Total I/O In: 225 [Other:135; NG/GT:90] Out: -  Vent settings for last 24 hours: Vent Mode:  [-] PSV;CPAP FiO2 (%):  [30 %] 30 % PEEP:  [5 cmH20] 5 cmH20 Pressure Support:  [12 cmH20] 12 cmH20  Labs:  Basename 03/21/11 0500  WBC 4.8  HGB 7.5*  HCT 24.1*  PLT 66*  APTT --  INR --  CREATININE 0.20*  LABCREA --  CREATININE 0.20*  LABCREA --  CREAT24HRUR --  MG --  PHOS --  ALBUMIN --  PROT --  AST --  ALT --  ALKPHOS --  BILITOT --  BILIDIR --  IBILI --   Estimated Creatinine Clearance: 73.4 ml/min (by C-G formula based on Cr of 0.2).  No results found for this basename: GLUCAP:3 in the last 72 hours  Microbiology: Recent Results (from the past 720 hour(s))  CULTURE, BLOOD (ROUTINE X 2)     Status: Normal   Collection Time   03/17/11 10:16 AM      Component Value Range Status Comment   Specimen Description BLOOD FEMORAL ARTERY COLLECTED BY DOCTOR   Final    Special Requests BOTTLES DRAWN AEROBIC AND ANAEROBIC 5CC   Final    Culture  Setup Time [id number]   Final    Culture     Final    Value: DIPHTHEROIDS(CORYNEBACTERIUM SPECIES)     Note: Standardized susceptibility testing for this organism is not available.     Note: Gram Stain Report Called to,Read Back By and Verified With: HAIRSTON M ON 03/18/11  AT 2040 BY LOY C Performed at [facility]   Report Status 03/20/2011 FINAL   Final   MRSA PCR SCREENING     Status: Abnormal   Collection Time   03/17/11  2:28 PM      Component Value Range Status Comment   MRSA by PCR POSITIVE (*) NEGATIVE  Final   CULTURE, BLOOD (ROUTINE X 2)     Status: Normal   Collection Time   03/17/11  4:00 PM      Component Value Range Status Comment   Specimen Description BLOOD RIGHT HAND   Final    Special Requests BOTTLES DRAWN AEROBIC AND ANAEROBIC 6CC   Final    Culture NO GROWTH 5 DAYS   Final    Report Status 03/22/2011 FINAL   Final   CULTURE, SPUTUM-ASSESSMENT     Status: Normal   Collection Time   03/17/11  7:06 PM      Component Value Range Status Comment   Specimen Description TRACHEAL ASPIRATE   Final    Special Requests NONE   Final    Sputum evaluation     Final    Value: THIS SPECIMEN IS ACCEPTABLE. RESPIRATORY CULTURE REPORT TO FOLLOW.     Performed at [facility]  Report Status 03/17/2011 FINAL   Final   CULTURE, RESPIRATORY     Status: Normal   Collection Time   03/17/11  7:06 PM      Component Value Range Status Comment   Specimen Description TRACHEAL ASPIRATE   Final    Special Requests NONE   Final    Gram Stain     Final    Value: MODERATE WBC PRESENT,BOTH PMN AND MONONUCLEAR     FEW SQUAMOUS EPITHELIAL CELLS PRESENT     RARE GRAM POSITIVE COCCI IN PAIRS     RARE GRAM POSITIVE RODS   Culture Non-Pathogenic Oropharyngeal-type Flora Isolated.   Final    Report Status 03/20/2011 FINAL   Final     Medications:  Scheduled:    . acyclovir cream   Topical QID  . antiseptic oral rinse  15 mL Mouth Rinse QID  . baclofen  20 mg Per Tube QID  . carbamazepine  200 mg Oral Daily   And  . carbamazepine  300 mg Oral Custom  . chlorhexidine  15 mL Mouth/Throat BID  . feeding supplement (JEVITY 1.2)  1,000 mL Per Tube Q24H  . free water  100 mL Per Tube Q12H  . levalbuterol  6 puff Inhalation Q6H  . levofloxacin  (LEVAQUIN) IV  750 mg Intravenous Q24H  . pantoprazole sodium  40 mg Per Tube Daily  . polyethylene glycol  17 g Per Tube Daily  . silver sulfADIAZINE  1 application Topical Daily  . Valproic Acid  625 mg Per Tube TID  . DISCONTD: free water  100 mL Per Tube QID  . DISCONTD: vancomycin  1,250 mg Intravenous Q6H   Infusions:    . DISCONTD: sodium chloride 50 mL/hr (03/22/11 0408)  . DISCONTD: propofol     PRN: acetaminophen, hydrocortisone cream, levalbuterol, mupirocin ointment, nystatin ointment, sodium chloride, sodium phosphate, sorbitol, zolpidem  Assessment:  Admit Complaint: 26 y/o F with PMH of cerebral palsy requiring 24 hour care, epilepsy, reflux esophagitis s/p feeding tube and chronic trach admitted to [facility] on 2/28 with complaints of fever, increased suctioning, increase in O2 requirement and chest congestion.   Anticoagulation: on hep 5K sq q8, am dose refused by Mom, now SCDs only 2nd thrombocytopenia  Infectious Disease: CAP: D7 Levaquin (on abx for suspected PNA; s/p aztreonam/flagyl); Currently low fever, wbc wnl; (2/28) Resp cx-NPF, (2/28) Bcx 1/2 on 2/28 diphtheroids - likely contaminant. MRSA PCR (+), Scr low d/t debilitation. Good UOP per RN. Vanc trough 11.4 on 3/4. Vanc d/ced 3/5.   Cardiovascular: BP low normal, tachycardia resolving. No meds.  Gastrointestinal / Nutrition: ileus, reflux esophagitis, has PEG for feeding and meds, protonix PT; on Jevity; glycopyrrolate for secretions d/ced. LBM 3/6. On miralax daily, sorbitol PRN, and sodium phosphate enema PRN. Tolerating feedings.  Neurology: cerebral palsy, epilepsy, on tegretol, depakote, Valproic acid level 71.1 ok; free carbamazepine level therapeutic. On home doses. Diastat prn, baclofen 20mg  PT QID for spasticity. RASS=0.  Nephrology: renal fx appears stable, Scr is falsely low d/t debilitation. No UOP recorded (Mom refuses Foley catheter but nurse states that urine output seems good); K 3.6, KVO NS, Free  water PT decreased to Q12 b/c Na 134  Pulmonary: CAP vs aspiration PNA; failed trach collar (3/2), now with cuffed trach; potential for trach exchange if to come off vent; on vent PSV;CPAP FIO2 down to 30; xopenex scheduled. CXR (3/6): unchanged bibasilar opacities.  Hematology / Oncology: thrombocytopenia (plt trending up), hgb trending down  PTA  Meds: Alprazolam PRN not yet resumed.  Best Practice: MC, SCDs, PPI  Goal of Therapy:  WBC WNL, afebrile  Plan:  F/u duration of levaquin therapy. IDSA guidelines for ventilator-associated PNA recommends 7-8 days of therapy. Transfer out of ICU.  Delphia Grates, PharmD Candidate 03/23/2011,10:55 AM  I agree with above assessment and recommendations.  Link Snuffer, PharmD, BCPS Clinical Pharmacist [phone] 03/23/2011, 1:15 PM

## 2011-03-23 NOTE — Progress Notes (Signed)
Nutrition Follow-up  Patient remains on ventilator.  Diet Order:  TF via PEG:  Jevity 1.2 at 45 ml/h providing 1296 kcals, 60 grams protein, 875 ml free water daily.  Free water flushes:  100 ml QID.  Meds: Scheduled Meds:   . acyclovir cream   Topical QID  . antiseptic oral rinse  15 mL Mouth Rinse QID  . baclofen  20 mg Per Tube QID  . carbamazepine  200 mg Oral Daily   And  . carbamazepine  300 mg Oral Custom  . chlorhexidine  15 mL Mouth/Throat BID  . feeding supplement (JEVITY 1.2)  1,000 mL Per Tube Q24H  . free water  100 mL Per Tube Q12H  . glycopyrrolate  2.5 mg Per Tube BID  . levalbuterol  6 puff Inhalation Q6H  . levofloxacin (LEVAQUIN) IV  750 mg Intravenous Q24H  . pantoprazole sodium  40 mg Per Tube Daily  . polyethylene glycol  17 g Per Tube Daily  . silver sulfADIAZINE  1 application Topical Daily  . Valproic Acid  625 mg Per Tube TID  . DISCONTD: glycopyrrolate  2.5 mg Oral Q12H   Continuous Infusions:  PRN Meds:.acetaminophen, hydrocortisone cream, levalbuterol, mupirocin ointment, nystatin ointment, sodium chloride, sodium phosphate, sorbitol, zolpidem  Labs:  CMP     Component Value Date/Time   NA 134* 03/21/2011 0500   K 3.6 03/21/2011 0500   CL 97 03/21/2011 0500   CO2 33* 03/21/2011 0500   GLUCOSE 125* 03/21/2011 0500   BUN 6 03/21/2011 0500   CREATININE 0.20* 03/21/2011 0500   CALCIUM 8.0* 03/21/2011 0500   PROT 6.4 03/18/2011 0700   ALBUMIN 2.8* 03/18/2011 0700   AST 22 03/18/2011 0700   ALT 6 03/18/2011 0700   ALKPHOS 50 03/18/2011 0700   BILITOT 0.1* 03/18/2011 0700   GFRNONAA >90 03/21/2011 0500   GFRAA >90 03/21/2011 0500    Intake/Output Summary (Last 24 hours) at 03/23/11 1540 Last data filed at 03/23/11 1500  Gross per 24 hour  Intake   1550 ml  Output      0 ml  Net   1550 ml    Weight Status:  44 kg (down slightly from 45.9 kg 3/2)  Re-estimated needs:  1250-1450 kcals, 55-70 grams protein daily.  Nutrition Dx:  Inadequate enteral nutrition infusion,  resolved.  Goal:  Meet >/= 90% of estimated nutrition needs, met.  Intervention:  Continue Jevity 1.2 at 45 ml/h to meet 100% of estimated nutrition needs.  Monitor:  TF tolerance/adequacy, labs, weight trend.   Hettie Holstein Pager #:  [phone]

## 2011-03-24 ENCOUNTER — Inpatient Hospital Stay (HOSPITAL_COMMUNITY): Payer: Medicare Other

## 2011-03-24 ENCOUNTER — Encounter (HOSPITAL_COMMUNITY): Payer: Self-pay | Admitting: Obstetrics and Gynecology

## 2011-03-24 LAB — BASIC METABOLIC PANEL
Calcium: 8.5 mg/dL (ref 8.4–10.5)
GFR calc Af Amer: >90 mL/min (ref >90)
GFR calc non Af Amer: >90 mL/min (ref >90)
Glucose, Bld: 88 mg/dL (ref 70–99)
Sodium: 136 mEq/L (ref 135–145)

## 2011-03-24 LAB — CBC
Hemoglobin: 7.6 g/dL — ABNORMAL LOW (ref 12.0–15.0)
MCH: 27 pg (ref 26.0–34.0)
MCHC: 32.3 g/dL (ref 30.0–36.0)
Platelets: 193 10*3/uL (ref 150–400)
RDW: 13.1 % (ref 11.5–15.5)

## 2011-03-24 MED ORDER — LEVOFLOXACIN 750 MG PO TABS
750.0000 mg | ORAL_TABLET | Freq: Every day | ORAL | Status: AC
  Administered 2011-03-24 – 2011-03-26 (×3): 750 mg via ORAL
  Filled 2011-03-24 (×3): qty 1

## 2011-03-24 MED ORDER — ENSURE CLINICAL ST REVIGOR PO LIQD
237.0000 mL | Freq: Four times a day (QID) | ORAL | Status: DC
  Administered 2011-03-24 – 2011-03-26 (×6): 237 mL
  Administered 2011-03-26 (×2)
  Administered 2011-03-26 – 2011-04-02 (×22): 237 mL
  Administered 2011-04-02 (×2)
  Administered 2011-04-02 – 2011-04-04 (×6): 237 mL
  Filled 2011-03-24 (×5): qty 237

## 2011-03-24 NOTE — Clinical Documentation Improvement (Signed)
Anemia Documentation Clarification Query  THIS DOCUMENT IS NOT A PERMANENT PART OF THE MEDICAL RECORD  RESPOND TO THE THIS QUERY, FOLLOW THE INSTRUCTIONS BELOW:  1. If needed, update documentation for the patient's encounter via the notes activity.  2. Access this query again and click edit on the In [employer].  3. After updating, or not, click F2 to complete all highlighted (required) fields concerning your review. Select "additional documentation in the medical record" OR "no additional documentation provided".  4. Click Sign note button.  5. The deficiency will fall out of your In Basket *Please let us know if you are not able to complete this workflow by phone or e-mail (listed below).          03/24/11  Dear Dr. Marin Shutter Marton Redwood  In an effort to better capture your patient's severity of illness, reflect appropriate length of stay and utilization of resources, a review of the patient medical record has revealed the following indicators.   Based on your clinical judgment, please clarify and document in a progress note and/or discharge summary the clinical condition associated with the following supporting information: In responding to this query please exercise your independent judgment.  The fact that a query is asked, does not imply that any particular answer is desired or expected.  Possible Clinical Conditions?  " Iron deficiency anemia " Chronic blood loss anemia " Anemia due to chronic disease " Acute Blood Loss Anemia " Precipitous drop in Hematocrit " Other Condition____________ " Cannot Clinically Determine   Supporting Information:  Clinical Information:  No documented history of anemia Diagnostics: Admission H&H: 10/31.3 3/2 H&H: 8.8/28 3/3 H&H: 7.8/24.9 3/4 H&H: 7.5/24.1 3/7 H&H: 7.6/23.5 Other: Treatments: H&H monitoring  You may use possible, probable, or suspect with inpatient documentation. Possible, probable, suspected diagnoses MUST be  documented at the time of discharge.  Reviewed:  no additional documentation provided  Thank You,  Harless Litten RN, MSN Clinical Documentation Specialist: Office# [phone] Health Information Management [place]

## 2011-03-24 NOTE — Progress Notes (Signed)
Patient's family has requested the patient wear diapers while she is in the chair because she is incontinent of urine and stool. I informed her that we do not use diapers at our facility, and the family brought in their own diapers.   Milinda Cave, RN

## 2011-03-24 NOTE — Progress Notes (Signed)
Name: [name] MRN: [id number] DOB: Jan 02, 1986    LOS: 7  PCCM FOLLOW UP NOTE   History of Present Illness:  26 y/o F with PMH of cerebral palsy requiring 24 hour care, epilepsy, reflux esophagitis s/p feeding tube and chronic trach admitted to [facility] on 2/28 with fever, increased resp secretions, increased O2 requirement.  CXR demonstrated R sided infiltrate concerning for PNA.  Patient transferred to [facility] 3/1 for further care.    Lines / Drains: Pediatric cuffless trach (pta)>>>3/1 #4 cuffless 3/1>>>3/2     #6 ETT/trach 3/2 >> 3/3 Bivona trach 3/3 >>  RUE PICC 3/3 >>  Cultures: 2/28 MRSA PCR >> POS 2/28 Blood>>> 1/2 Diphtheroids (contaminant) 2/28 Sputum >> normal flora   Antibiotics:   2/28 Clinda>>>3/1 3/1  Aztreonam (Pseudomonas)>>> 3/4 3/1  Flagyl (Anaerobes)>>> 3/4 2/28 Vanco >> 3/5 2/28 Levaquin >> 3/10 (stop date ordered)  Tests / Events: 3/1  Transferred from [facility] with pneumonia 3/7 transferred to SDU vent bed   Vital Signs: Filed Vitals:   03/24/11 1500 03/24/11 1600 03/24/11 1617 03/24/11 1700  BP: 94/50 94/58 94/58  102/56  Pulse: 66 76 91 97  Temp:      TempSrc:      Resp: 24 25 29    Height:      Weight:      SpO2: 96% 97% 96% 97%    Intake/Output Summary (Last 24 hours) at 03/24/11 1759 Last data filed at 03/24/11 1700  Gross per 24 hour  Intake    650 ml  Output      6 ml  Net    644 ml    Physical Examination: General: chronically ill, cerebral palsy contractures Neuro: sleeping, arousable  CV: s1s2 tachy, regular PULM: Scattered rhonchi, no wheeze GI: round / soft, bsx4 active, PEG Extremities: Warm/dry, contracted   Ventilator settings: Vent Mode:  [-] PSV;CPAP FiO2 (%):  [30 %-35 %] 35 % PEEP:  [5 cmH20] 5 cmH20 Pressure Support:  [12 cmH20] 12 cmH20  Labs   CBC    Component Value Date/Time   WBC 5.1 03/24/2011 0500   RBC 2.82* 03/24/2011 0500   HGB 7.6* 03/24/2011 0500   HCT 23.5* 03/24/2011 0500   PLT 193 03/24/2011  0500   MCV 83.3 03/24/2011 0500   MCH 27.0 03/24/2011 0500   MCHC 32.3 03/24/2011 0500   RDW 13.1 03/24/2011 0500   LYMPHSABS 2.3 03/17/2011 1050   MONOABS 1.3* 03/17/2011 1050   EOSABS 0.0 03/17/2011 1050   BASOSABS 0.0 03/17/2011 1050    BMET    Component Value Date/Time   NA 136 03/24/2011 0500   K 3.3* 03/24/2011 0500   CL 99 03/24/2011 0500   CO2 30 03/24/2011 0500   GLUCOSE 88 03/24/2011 0500   BUN 7 03/24/2011 0500   CREATININE 0.24* 03/24/2011 0500   CALCIUM 8.5 03/24/2011 0500   GFRNONAA >90 03/24/2011 0500   GFRAA >90 03/24/2011 0500      Radiology: CXR: improved LLL ATX  Assessment and Plan:  Acute on chronic respiratory failure due to pneumonia in setting of long term tracheostomy.  PLAN -  -now with Bivona trach. Copious watery secretions. Weaning on PSV. -Cont chest percussion vest BID (started 3/6) -Cont glycopyrrolate for copious thin secretions -She weans much more effectively when sitting up in her specialized chair  Pneumonia -  PLAN -  Complete 10 days Levaquin - stop date ordered  Cerebral palsy.  History of seizures.  No active seizures. PLAN -  -  Continue preadmission medications   GERD, dysphagia, s/p PEG PLAN -  -Change TFs to bolus feedings on home regimen -Cont Protonix   Thrombocytopenia- resolved.  Lab 03/24/11 0500 03/21/11 0500 03/20/11 0447 03/19/11 0500 03/18/11 0700  PLT 193 66* 64* 54* 63*   Goals of Care: Discussed in detail with pt's mother. We hope to wean her from vent. Then we will need to transition back to her Shiley trach. If unsuccessful in getting her off the vent, the mother wishes that she remain on long term vent and believes that she can do this @ home with the support that she already has @ home  Best practices / Disposition: -->Full Code -->DVT Px: scd's -->GI Px: protonix -->Diet: PEG feeding   Mother updated in detail. CCM time X 30 mins   Billy Fischer, MD;  PCCM service; Mobile [phone]

## 2011-03-24 NOTE — Progress Notes (Signed)
The patient was ready to progress to 2900 SDU. Report was called to Maralyn Sago, Charity fundraiser. The family was updated and all belongings were sent with the patient. She has her own personal wheelchair, sling for the [facility] lift, and diapers with her. Vital signs stable during transport.   Milinda Cave, RN

## 2011-03-24 NOTE — Progress Notes (Signed)
Discussed tube feeding with MD Ramaswamy. Will continue to hold till morning due to agitation and emesis in the afternoon.  [name]

## 2011-03-25 ENCOUNTER — Inpatient Hospital Stay (HOSPITAL_COMMUNITY): Payer: Medicare Other

## 2011-03-25 DIAGNOSIS — J9819 Other pulmonary collapse: Secondary | ICD-10-CM

## 2011-03-25 DIAGNOSIS — J9600 Acute respiratory failure, unspecified whether with hypoxia or hypercapnia: Secondary | ICD-10-CM

## 2011-03-25 DIAGNOSIS — Z9911 Dependence on respirator [ventilator] status: Secondary | ICD-10-CM

## 2011-03-25 DIAGNOSIS — J159 Unspecified bacterial pneumonia: Secondary | ICD-10-CM

## 2011-03-25 MED ORDER — FUROSEMIDE 10 MG/ML IJ SOLN
20.0000 mg | Freq: Three times a day (TID) | INTRAMUSCULAR | Status: AC
  Filled 2011-03-25 (×2): qty 2

## 2011-03-25 MED ORDER — POTASSIUM CHLORIDE 10 MEQ/50ML IV SOLN
10.0000 meq | INTRAVENOUS | Status: AC
  Administered 2011-03-25 (×2): 10 meq via INTRAVENOUS
  Filled 2011-03-25 (×2): qty 50

## 2011-03-25 NOTE — Progress Notes (Signed)
Name: [name] MRN: [id number] DOB: 08-30-1985    LOS: 8  PCCM FOLLOW UP NOTE   History of Present Illness:  26 y/o F with PMH of cerebral palsy requiring 24 hour care, epilepsy, reflux esophagitis s/p feeding tube and chronic trach admitted to [facility] on 2/28 with fever, increased resp secretions, increased O2 requirement.  CXR demonstrated R sided infiltrate concerning for PNA.  Patient transferred to [facility] 3/1 for further care.    Lines / Drains: Pediatric cuffless trach (pta)>>>3/1 #4 cuffless 3/1>>>3/2     #6 ETT/trach 3/2 >> 3/3 Bivona trach 3/3 >>  RUE PICC 3/3 >>  Cultures: 2/28 MRSA PCR >> POS 2/28 Blood>>> 1/2 Diphtheroids (contaminant) 2/28 Sputum >> normal flora   Antibiotics:   2/28 Clinda>>>3/1 3/1  Aztreonam (Pseudomonas)>>> 3/4 3/1  Flagyl (Anaerobes)>>> 3/4 2/28 Vanco >> 3/5 2/28 Levaquin >> 3/10 (stop date ordered)  Tests / Events: 3/1  Transferred from [facility] with pneumonia 3/7 transferred to SDU vent bed   Vital Signs: Filed Vitals:   03/25/11 0547 03/25/11 0600 03/25/11 0748 03/25/11 0800  BP: 121/53   103/64  Pulse:   117   Temp:    100.8 F (38.2 C)  TempSrc:    Oral  Resp: 24  21   Height:      Weight:      SpO2: 97% 98% 95% 92%    Intake/Output Summary (Last 24 hours) at 03/25/11 1017 Last data filed at 03/25/11 0800  Gross per 24 hour  Intake    900 ml  Output      3 ml  Net    897 ml    Physical Examination: General: chronically ill, cerebral palsy contractures Neuro: sleeping, arousable  CV: s1s2 tachy, regular PULM: Scattered rhonchi, no wheeze GI: round / soft, bsx4 active, PEG Extremities: Warm/dry, contracted   Ventilator settings: Vent Mode:  [-] PSV;CPAP FiO2 (%):  [30 %-35 %] 35 % PEEP:  [5 cmH20] 5 cmH20 Pressure Support:  [12 cmH20] 12 cmH20  Labs   CBC    Component Value Date/Time   WBC 5.1 03/24/2011 0500   RBC 2.82* 03/24/2011 0500   HGB 7.6* 03/24/2011 0500   HCT 23.5* 03/24/2011 0500   PLT 193  03/24/2011 0500   MCV 83.3 03/24/2011 0500   MCH 27.0 03/24/2011 0500   MCHC 32.3 03/24/2011 0500   RDW 13.1 03/24/2011 0500   LYMPHSABS 2.3 03/17/2011 1050   MONOABS 1.3* 03/17/2011 1050   EOSABS 0.0 03/17/2011 1050   BASOSABS 0.0 03/17/2011 1050    BMET    Component Value Date/Time   NA 136 03/24/2011 0500   K 3.3* 03/24/2011 0500   CL 99 03/24/2011 0500   CO2 30 03/24/2011 0500   GLUCOSE 88 03/24/2011 0500   BUN 7 03/24/2011 0500   CREATININE 0.24* 03/24/2011 0500   CALCIUM 8.5 03/24/2011 0500   GFRNONAA >90 03/24/2011 0500   GFRAA >90 03/24/2011 0500   Radiology: CXR: improved LLL ATX  Assessment and Plan:  Acute on chronic respiratory failure due to pneumonia in setting of long term tracheostomy.  PLAN -  -Now with Bivona trach. Copious watery secretions. Will not change trach since patient has excessive secretion. -Cont chest percussion vest BID (started 3/6) -Cont glycopyrrolate for copious thin secretions -Diureses today to decrease secretion and address fluid overload.  Pneumonia -  PLAN -  Complete 10 days Levaquin - stop date ordered  Cerebral palsy.  History of seizures.  No  active seizures. PLAN -  -Continue preadmission medications  GERD, dysphagia, s/p PEG PLAN -  -Change TFs to bolus feedings on home regimen -Cont Protonix  Thrombocytopenia- resolved.  Lab 03/24/11 0500 03/21/11 0500 03/20/11 0447 03/19/11 0500  PLT 193 66* 64* 54*   Goals of Care: Discussed in detail with pt's mother. We hope to wean her from vent. Then we will need to transition back to her Shiley trach. If unsuccessful in getting her off the vent, the mother wishes that she remain on long term vent and believes that she can do this @ home with the support that she already has @ home  Best practices / Disposition: -->Full Code -->DVT Px: scd's -->GI Px: protonix -->Diet: PEG feeding  Alyson Reedy, M.D. [facility] Pulmonary/Critical Care Medicine. Pager: [phone]. After hours pager: [phone].

## 2011-03-25 NOTE — Progress Notes (Signed)
TRANSEFRRED TO 2100 BY BED , REPORT GIVEN TO RN, ALL BELONGINGS WITH PT.

## 2011-03-25 NOTE — Progress Notes (Signed)
Pt coughing and suction performed multiple times. White frothy secretions suctioned. During suctioning pt had 2 episodes of  becoming stiff, respiratory rate increased, HR 170's. O2 sats dropped to 70 x1 otherwise pt maintained in low 90's. Dr. Darrick Penna notified and camera in, at the time pt was settling down. No new orders obtained. Will continue to monitor.

## 2011-03-25 NOTE — Progress Notes (Signed)
Called by bedside RN, patient decompensated once was moved to the chair.  Desaturation down in the 60's on 30% TC.  I had met with the mother earlier and had an extensive discussion.  There are clearly some serious psychosocial issues at play here.  She refused the lasix and demanded that the patient be put in the chair.  As per previous notes, she did better in the chair.  The patient was moved to chair and quickly decompensated from a respiratory standpoint and required bagging to get saturation up and significant suctioning.  Very large amount of secretions, I attempted to give the patient lasix to keep dry but mother refused.  She was made aware of the consequences but continued to refuse.  At this point, the patient is too unstable to keep in the SDU.  Will move to the ICU and will likely need to be placed back on the vent.  CC time 55 minutes.

## 2011-03-25 NOTE — Progress Notes (Signed)
CONSTANT SUCTIONING DONE WITH THICK FROTHY  WHITE SECRETIONS , MD MADE AWARE WITH ORDER FOR LASIX , BUT PT'S MOTHER REFUSED TO BE GIVEN, MD NOTIFIED.

## 2011-03-25 NOTE — Progress Notes (Signed)
REPORTED BY BY RT AND RN WHO TRIED TO TRANSFER HER TO CHAIR THAT SHE DESATS  TO 80'S  AND RESP -50S  , BACK TO BED , MD AWARE WITH ORDER.  V/S HR-108, R-38 O2 SAT 96 %, SUCTIONED 6X WITH FROTHY SECRETIONS CONTINUE TO MONITOR.

## 2011-03-25 NOTE — Progress Notes (Signed)
   CARE MANAGEMENT NOTE 03/25/2011  Patient:  [name], [name]   Account Number:  [id number]  Date Initiated:  03/21/2011  Documentation initiated by:  [facility]  Subjective/Objective Assessment:   Admitted with pneumonia - from home - trach;   24 hour assistance - lives with mother.     Action/Plan:   PTA, PT LIVES WITH MOTHER, HAS NURSING PROVIDED BY [facility]--18HRS/DAY MON-FRI; 14HRS/DAY SAT AND SUN.   Anticipated DC Date:  03/28/2011   Anticipated DC Plan:  HOME W HOME HEALTH SERVICES      DC Planning Services  CM consult      [facility] Choice  HOME HEALTH   Choice offered to / List presented to:             [facility] agency  [facility]   Status of service:  In process, will continue to follow Medicare Important Message given?   (If response is "NO", the following Medicare IM given date fields will be blank) Date Medicare IM given:   Date Additional Medicare IM given:    Discharge Disposition:    Per UR Regulation:  Reviewed for med. necessity/level of care/duration of stay  Comments:  03/25/11 Evolett Somarriba,RN,BSN 1200 PT TRANSFERRED TO 2106 AFTER HAVING RESPIRATORY DISTRESS DURING TRANSFER TO CHAIR.  03/25/11 Laiden Milles,RN,BSN 1030 ORDER FOR LTAC CONSULT.  MET WITH PT'S MOTHER AND AUNT TO DISCUSS POSS LTAC.  MOM VERY UPSET TODAY AT PT BEING TRANSFERRED TO STEPDOWN UNIT; STATES PT HASN'T BEEN MOVED TO HER CHAIR YET OR HAD HER VIBRAVEST PUT ON.  SHE WANTS PT TO STAY "IN A ROUNTINE AS CLOSE TO HOME AS POSSIBLE" WHILE IN HOSPITAL".  SHE WANTS BOLUS TUBE FEEDING TIMES CHANGED TO 8A, 2P, 8P AND 2A AS AT HOME.  SHE IS UPSET AT THE CONSIDERATION OF LTAC, AND STATES THAT SHE WILL NOT CONSIDER ANY LONG TERM CARE FOR HER DAUGHTER, SHE WILL ONLY CARE FOR HER AT HOME.  DR Molli Knock MET WITH FAMILY, EXPLAINED PT'S CONDITION, REASON FOR LTAC CONSULT.  MOM SEEMS SATISFIED PRESENTLY.  WILL CONT TO FOLLOW PROGRESS. Phone #[phone]   03/22/11 Assyria Morreale,RN,BSN 1230 MET WITH PT'S  MOTHER, CSW, AND [facility] REPRESENTATIVES X2 TO DISCUSS DC PLAN.  MOTHER PROVIDES CARE WITH ASSISTANCE OF HOME NURSING, PER SCHEDULE ABOVE.  WILL FOLLOW PROGRESSION, FAX DISCHARGE INSTRUCTIONS AND ORDERS TO [facility] WHEN AVAILABLE, ATTN: ANGIE (PT'S NURSE). Phone #[phone]

## 2011-03-26 DIAGNOSIS — G40309 Generalized idiopathic epilepsy and epileptic syndromes, not intractable, without status epilepticus: Secondary | ICD-10-CM

## 2011-03-26 LAB — BASIC METABOLIC PANEL
Calcium: 8.8 mg/dL (ref 8.4–10.5)
Chloride: 99 mEq/L (ref 96–112)
Creatinine, Ser: 0.3 mg/dL — ABNORMAL LOW (ref 0.50–1.10)
GFR calc Af Amer: >90 mL/min (ref >90)
Sodium: 135 mEq/L (ref 135–145)

## 2011-03-26 LAB — CBC
MCH: 28.3 pg (ref 26.0–34.0)
MCV: 87 fL (ref 78.0–100.0)
Platelets: 299 10*3/uL (ref 150–400)
RDW: 13.7 % (ref 11.5–15.5)
WBC: 8.5 10*3/uL (ref 4.0–10.5)

## 2011-03-26 LAB — MAGNESIUM: Magnesium: 2.2 mg/dL (ref 1.5–2.5)

## 2011-03-26 MED ORDER — ZOLPIDEM TARTRATE 5 MG PO TABS: 2.5000 mg | ORAL_TABLET | Freq: Every evening | ORAL | Status: DC | PRN

## 2011-03-26 MED ORDER — DEXTROSE 5 % IV SOLN
1.0000 g | Freq: Two times a day (BID) | INTRAVENOUS | Status: DC
  Administered 2011-03-26 – 2011-04-04 (×18): 1 g via INTRAVENOUS
  Filled 2011-03-26 (×19): qty 1

## 2011-03-26 MED ORDER — ALTEPLASE 2 MG IJ SOLR
2.0000 mg | Freq: Once | INTRAMUSCULAR | Status: AC
  Administered 2011-03-26: 2 mg
  Filled 2011-03-26: qty 2

## 2011-03-26 MED ORDER — DIPHENHYDRAMINE HCL 50 MG/ML IJ SOLN
INTRAMUSCULAR | Status: AC
  Filled 2011-03-26: qty 1

## 2011-03-26 MED ORDER — DIPHENHYDRAMINE HCL 50 MG/ML IJ SOLN
25.0000 mg | Freq: Once | INTRAMUSCULAR | Status: AC
  Administered 2011-03-26: 25 mg via INTRAVENOUS

## 2011-03-26 MED ORDER — DEXTROSE 5 % IV SOLN
1.0000 g | INTRAVENOUS | Status: AC
  Administered 2011-03-26: 1 g via INTRAVENOUS
  Filled 2011-03-26 (×2): qty 1

## 2011-03-26 MED ORDER — VANCOMYCIN HCL 1000 MG IV SOLR
1250.0000 mg | Freq: Three times a day (TID) | INTRAVENOUS | Status: DC
  Administered 2011-03-26 – 2011-03-27 (×3): 1250 mg via INTRAVENOUS
  Filled 2011-03-26 (×5): qty 1250

## 2011-03-26 MED ORDER — VANCOMYCIN HCL 1000 MG IV SOLR
1250.0000 mg | INTRAVENOUS | Status: AC
  Administered 2011-03-26: 1250 mg via INTRAVENOUS
  Filled 2011-03-26: qty 1250

## 2011-03-26 MED ORDER — DEXTROSE 5 % IV SOLN
1.0000 g | Freq: Two times a day (BID) | INTRAVENOUS | Status: DC
  Filled 2011-03-26 (×2): qty 1

## 2011-03-26 NOTE — Progress Notes (Signed)
ANTIBIOTIC CONSULT NOTE - INITIAL  Pharmacy Consult for Vancomycin/Cefepime Indication: ?HAP  Allergies  Allergen Reactions  . Amoxicillin Other (See Comments)    Yeast Infection   . Morphine And Related Other (See Comments)    Unknown  . Ceftazidime Rash    Patient Measurements: Height: 5\' 5"  (165.1 cm) Weight: 87 lb 4.8 oz (39.6 kg) IBW/kg (Calculated) : 57    Vital Signs: Temp: 98.2 F (36.8 C) (03/09 0802) Temp src: Oral (03/09 0802) BP: 113/66 mmHg (03/09 1209) Pulse Rate: 114  (03/09 1209) Intake/Output from previous day: 03/08 0701 - 03/09 0700 In: 1017 [I.V.:180; NG/GT:310; IV Piggyback:100] Out: 2 [Urine:1; Stool:1] Intake/Output from this shift: Total I/O In: 472 [I.V.:40; Other:332; NG/GT:100] Out: -   Labs:  Basename 03/26/11 0517 03/24/11 0500  WBC 8.5 5.1  HGB 8.5* 7.6*  PLT 299 193  LABCREA -- --  CREATININE 0.30* 0.24*   Estimated Creatinine Clearance: 66 ml/min (by C-G formula based on Cr of 0.3). No results found for this basename: VANCOTROUGH:2,VANCOPEAK:2,VANCORANDOM:2,GENTTROUGH:2,GENTPEAK:2,GENTRANDOM:2,TOBRATROUGH:2,TOBRAPEAK:2,TOBRARND:2,AMIKACINPEAK:2,AMIKACINTROU:2,AMIKACIN:2, in the last 72 hours   Microbiology: Recent Results (from the past 720 hour(s))  CULTURE, BLOOD (ROUTINE X 2)     Status: Normal   Collection Time   03/17/11 10:16 AM      Component Value Range Status Comment   Specimen Description BLOOD FEMORAL ARTERY COLLECTED BY DOCTOR   Final    Special Requests BOTTLES DRAWN AEROBIC AND ANAEROBIC 5CC   Final    Culture  Setup Time [id number]   Final    Culture     Final    Value: DIPHTHEROIDS(CORYNEBACTERIUM SPECIES)     Note: Standardized susceptibility testing for this organism is not available.     Note: Gram Stain Report Called to,Read Back By and Verified With: HAIRSTON M ON 03/18/11 AT 2040 BY LOY C Performed at [facility]   Report Status 03/20/2011 FINAL   Final   MRSA PCR SCREENING     Status: Abnormal     Collection Time   03/17/11  2:28 PM      Component Value Range Status Comment   MRSA by PCR POSITIVE (*) NEGATIVE  Final   CULTURE, BLOOD (ROUTINE X 2)     Status: Normal   Collection Time   03/17/11  4:00 PM      Component Value Range Status Comment   Specimen Description BLOOD RIGHT HAND   Final    Special Requests BOTTLES DRAWN AEROBIC AND ANAEROBIC 6CC   Final    Culture NO GROWTH 5 DAYS   Final    Report Status 03/22/2011 FINAL   Final   CULTURE, SPUTUM-ASSESSMENT     Status: Normal   Collection Time   03/17/11  7:06 PM      Component Value Range Status Comment   Specimen Description TRACHEAL ASPIRATE   Final    Special Requests NONE   Final    Sputum evaluation     Final    Value: THIS SPECIMEN IS ACCEPTABLE. RESPIRATORY CULTURE REPORT TO FOLLOW.     Performed at [facility]   Report Status 03/17/2011 FINAL   Final   CULTURE, RESPIRATORY     Status: Normal   Collection Time   03/17/11  7:06 PM      Component Value Range Status Comment   Specimen Description TRACHEAL ASPIRATE   Final    Special Requests NONE   Final    Gram Stain     Final    Value:  MODERATE WBC PRESENT,BOTH PMN AND MONONUCLEAR     FEW SQUAMOUS EPITHELIAL CELLS PRESENT     RARE GRAM POSITIVE COCCI IN PAIRS     RARE GRAM POSITIVE RODS   Culture Non-Pathogenic Oropharyngeal-type Flora Isolated.   Final    Report Status 03/20/2011 FINAL   Final     Medical History: Past Medical History  Diagnosis Date  . Cerebral palsy   . Epilepsy   . Ileus   . Reflux esophagitis     Medications:  Prescriptions prior to admission  Medication Sig Dispense Refill  . acetaminophen (TYLENOL) 500 MG tablet Give 500 mg by tube every 6 (six) hours as needed. Discomfort/Pain      . albuterol (PROVENTIL) (2.5 MG/3ML) 0.083% nebulizer solution Take 2.5 mg by nebulization every 6 (six) hours as needed. Wheezing/Shortness of Breath      . ALPRAZolam (XANAX) 0.5 MG tablet Give 0.5 mg by tube at bedtime as needed.  Agitation      . baclofen (LIORESAL) 10 mg/mL SUSP Give 20 mg by tube 4 (four) times daily.       . carbamazepine (TEGRETOL) 100 MG chewable tablet Give 200-300 mg by tube 3 (three) times daily. Takes 200 mg in the morning and 300 mg twice a day.      . Diazepam (DIASTAT PEDIATRIC RE) Place 5 mg rectally as needed. Seizures      . esomeprazole (NEXIUM) 40 MG capsule Give 40 mg by tube daily before breakfast.      . fluticasone (FLONASE) 50 MCG/ACT nasal spray Place 2 sprays into the nose daily.      Marland Kitchen glycopyrrolate (ROBINUL) 1 MG tablet Give 2.5 mg by tube 2 (two) times daily.      Marland Kitchen guaifenesin (ROBITUSSIN) 100 MG/5ML syrup Give 10-15 mLs by tube 4 (four) times daily as needed. Congestion      . hydrocortisone 2.5 % cream Apply 1 application topically 3 (three) times daily as needed. Redness      . ibuprofen (ADVIL,MOTRIN) 100 MG/5ML suspension Give 250 mg by tube every 4 (four) hours as needed. Pain/Fever      . Melatonin 1 MG TABS Give 1 tablet by tube at bedtime.      . mupirocin ointment (BACTROBAN) 2 % Apply 1 application topically 2 (two) times daily as needed. Redness of Irritation      . nystatin ointment (MYCOSTATIN) Apply 1 application topically 3 (three) times daily as needed. redness      . polyethylene glycol (MIRALAX / GLYCOLAX) packet Give 17 g by tube daily.       . silver sulfADIAZINE (SILVADENE) 1 % cream Apply 1 application topically daily. Spot on her toe      . Sodium Phosphates (FLEET ENEMA RE) Place 1 application rectally daily as needed. Constipation      . sorbitol 70 % solution Give 30 mLs by tube daily as needed. Constipation      . valproate (DEPAKENE) 250 MG/5ML syrup Give 625 mg by tube 3 (three) times daily.      . Wheat Dextrin (BENEFIBER PO) Give 15 mLs by tube 4 (four) times daily.       Marland Kitchen zolpidem (AMBIEN) 5 MG tablet Give 5 mg by tube at bedtime as needed. Sleep       Scheduled:    . acyclovir cream   Topical QID  . antiseptic oral rinse  15 mL Mouth  Rinse QID  . baclofen  20 mg Per Tube QID  .  carbamazepine  200 mg Oral Daily   And  . carbamazepine  300 mg Oral Custom  . ceFEPime (MAXIPIME) IV  1 g Intravenous Q12H  . ceFEPime (MAXIPIME) IV  1 g Intravenous NOW  . chlorhexidine  15 mL Mouth/Throat BID  . feeding supplement  237 mL Per Tube QID  . free water  100 mL Per Tube Q12H  . furosemide  20 mg Intravenous Q8H  . glycopyrrolate  2.5 mg Per Tube BID  . levalbuterol  6 puff Inhalation Q6H  . levofloxacin  750 mg Oral Daily  . pantoprazole sodium  40 mg Per Tube Daily  . polyethylene glycol  17 g Per Tube Daily  . potassium chloride  10 mEq Intravenous Q1 Hr x 4  . silver sulfADIAZINE  1 application Topical Daily  . Valproic Acid  625 mg Per Tube TID  . vancomycin  1,250 mg Intravenous NOW  . DISCONTD: ceFEPime (MAXIPIME) IV  1 g Intravenous Q12H   Assessment: 26 y.o. F with cerebral palsy requiring 24 hour care and chronic trach treated empirically for HCAP on admission and transitioned to Levaquin only to complete treatment (last dose this a.m.). On 3/8 the patient decompensated and was transferred to the MICU, she is febrile and has worsening respiratory status and CXR. Pharmacy was consulted to broaden coverage with Vancomycin/Cefepime for possible HAP. The patient is noted to have an allergy to Ceftazidime as "rash" -- this was discussed with Dr. Tyson Alias and to continue with Cefepime however monitor first doses closely.   The patient's SCr is falsely low due to debilitation related to cerebral palsy therefore kinetics are hard to estimate. This admission at SCr levels 0.2-0.24, the patient had a Vanc trough of 11.4 on dose of 1250 mg q8h. Current SCr is 0.24, will dose cautiously since at higher end of 0.2-0.24 range and start at same dose and check level at steady state to determine need for dose increase. There is no way to access UOP as the patient wears diapers and a foley has been refused. Will monitor SCr closely.    Goal of Therapy:  Vancomycin trough level 15-20 mcg/ml  Plan:  1. Vancomycin 1250 mg IV every 8 hours 2. Will check Vanc level at steady state to determine additional dose adjustments. 3. Cefepime 1g IV every 12 hours (nurse to monitor closely for s/sx of rash) 4. Will continue to follow renal function, culture results, LOT, and antibiotic de-escalation plans   Georgina Pillion, PharmD, BCPS Clinical Pharmacist Pager: [phone] 03/26/2011 1:00 PM

## 2011-03-26 NOTE — Progress Notes (Signed)
Name: [name] MRN: [id number] DOB: 16-Nov-1985    LOS: 9  PCCM FOLLOW UP NOTE   History of Present Illness:  26 y/o F with PMH of cerebral palsy requiring 24 hour care, epilepsy, reflux esophagitis s/p feeding tube and chronic trach admitted to [facility] on 2/28 with fever, increased resp secretions, increased O2 requirement.  CXR demonstrated R sided infiltrate concerning for PNA.  Patient transferred to [facility] 3/1 for further care.    Lines / Drains: Pediatric cuffless trach (pta)>>>3/1 #4 cuffless 3/1>>>3/2     #6 ETT/trach 3/2 >> 3/3 Bivona trach 3/3 >>  RUE PICC 3/3 >>  Cultures: 2/28 MRSA PCR >> POS 2/28 Blood>>> 1/2 Diphtheroids (contaminant) 2/28 Sputum >> normal flora   Antibiotics:   2/28 Clinda>>>3/1 3/1  Aztreonam (Pseudomonas)>>> 3/4 3/1  Flagyl (Anaerobes)>>> 3/4 2/28 Vanco >> 3/5 2/28 Levaquin >> 3/10 (stop date ordered)  Tests / Events: 3/1  Transferred from [facility] with pneumonia 3/7 transferred to SDU vent bed 3/8- decompensates back to unit and vent support   Vital Signs: Filed Vitals:   03/26/11 0839 03/26/11 0900 03/26/11 1000 03/26/11 1100  BP: 123/84 117/64  113/66  Pulse: 82 107 131 112  Temp:      TempSrc:      Resp: 22 26 18 30   Height:      Weight:      SpO2: 100% 98% 91% 98%    Intake/Output Summary (Last 24 hours) at 03/26/11 1142 Last data filed at 03/26/11 0700  Gross per 24 hour  Intake    897 ml  Output      2 ml  Net    895 ml    Physical Examination: General: chronically ill, cerebral palsy contractures, back to trach collar this am  Neuro: sleeping, arousable  CV: s1s2 tachy, regular PULM: Scattered rhonchi rt greater left, no wheeze GI: round / soft, bsx4 active, PEG Extremities: Warm/dry, contracted   Ventilator settings: Vent Mode:  [-] PSV FiO2 (%):  [35 %-40 %] 40 % PEEP:  [5 cmH20] 5 cmH20 Pressure Support:  [12 cmH20] 12 cmH20  Labs   CBC    Component Value Date/Time   WBC 8.5 03/26/2011 0517   RBC 3.00* 03/26/2011 0517   HGB 8.5* 03/26/2011 0517   HCT 26.1* 03/26/2011 0517   PLT 299 03/26/2011 0517   MCV 87.0 03/26/2011 0517   MCH 28.3 03/26/2011 0517   MCHC 32.6 03/26/2011 0517   RDW 13.7 03/26/2011 0517   LYMPHSABS 2.3 03/17/2011 1050   MONOABS 1.3* 03/17/2011 1050   EOSABS 0.0 03/17/2011 1050   BASOSABS 0.0 03/17/2011 1050    BMET    Component Value Date/Time   NA 135 03/26/2011 0517   K 4.1 03/26/2011 0517   CL 99 03/26/2011 0517   CO2 28 03/26/2011 0517   GLUCOSE 98 03/26/2011 0517   BUN 11 03/26/2011 0517   CREATININE 0.30* 03/26/2011 0517   CALCIUM 8.8 03/26/2011 0517   GFRNONAA >90 03/26/2011 0517   GFRAA >90 03/26/2011 0517   Radiology: CXR: improved LLL ATX, chronic  Assessment and Plan:  Acute on chronic respiratory failure due to pneumonia in setting of long term tracheostomy.  PLAN -  -Now with Bivona trach. Copious watery secretions. Will not change trach since patient has excessive secretion. -Cont chest percussion vest BID (started 3/6) -Cont glycopyrrolate for copious thin secretions, remain on tele Rise in crt, low muscle mass under estimates, even is balance,  Hold lasix today  Trach collar atempt Then attempt chair is successful with trach collar pcxr reviewed, repeat in am , high risk collapse Vent will be needed in pm, maybe earlier Goal 6 hrs Likely will need home vent  Pneumonia -  PLAN -  Complete 10 days Levaquin - stop date ordered  Cerebral palsy.  History of seizures.  No active seizures. PLAN -  -Continue preadmission medications Low threshold level  GERD, dysphagia, s/p PEG PLAN -  -Change TFs to bolus feedings on home regimen -Cont Protonix  Thrombocytopenia- resolved.  Lab 03/26/11 0517 03/24/11 0500 03/21/11 0500 03/20/11 0447  PLT 299 193 66* 64*   Goals of Care: Discussed in detail with pt's mother. We hope to wean her from vent. Then we will need to transition back to her Shiley trach. If unsuccessful in getting her off the vent, the mother  wishes that she remain on long term vent and believes that she can do this @ home with the support that she already has @ home  Updated mom  Best practices / Disposition: -->Full Code -->DVT Px: scd's -->GI Px: protonix -->Diet: PEG feeding  Ccm 30 min   Mcarthur Rossetti. Tyson Alias, MD, FACP Pgr: [phone] [facility]

## 2011-03-27 DIAGNOSIS — G40309 Generalized idiopathic epilepsy and epileptic syndromes, not intractable, without status epilepticus: Secondary | ICD-10-CM

## 2011-03-27 DIAGNOSIS — J9600 Acute respiratory failure, unspecified whether with hypoxia or hypercapnia: Secondary | ICD-10-CM

## 2011-03-27 DIAGNOSIS — J159 Unspecified bacterial pneumonia: Secondary | ICD-10-CM

## 2011-03-27 DIAGNOSIS — J9819 Other pulmonary collapse: Secondary | ICD-10-CM

## 2011-03-27 LAB — BASIC METABOLIC PANEL
CO2: 29 mEq/L (ref 19–32)
Chloride: 97 mEq/L (ref 96–112)
GFR calc Af Amer: >90 mL/min (ref >90)
Potassium: 3.8 mEq/L (ref 3.5–5.1)
Sodium: 132 mEq/L — ABNORMAL LOW (ref 135–145)

## 2011-03-27 LAB — CBC
HCT: 27.5 % — ABNORMAL LOW (ref 36.0–46.0)
Hemoglobin: 8.9 g/dL — ABNORMAL LOW (ref 12.0–15.0)
MCV: 82.3 fL (ref 78.0–100.0)
RBC: 3.34 MIL/uL — ABNORMAL LOW (ref 3.87–5.11)
WBC: 7 10*3/uL (ref 4.0–10.5)

## 2011-03-27 MED ORDER — POTASSIUM CHLORIDE 20 MEQ/15ML (10%) PO LIQD
40.0000 meq | Freq: Once | ORAL | Status: AC
  Administered 2011-03-27: 40 meq
  Filled 2011-03-27: qty 30

## 2011-03-27 MED ORDER — ABCIXIMAB 2 MG/ML IV SOLN: 0.2500 mg/kg | Freq: Once | INTRAVENOUS | Status: DC

## 2011-03-27 MED ORDER — SODIUM CHLORIDE 0.9 % IV SOLN: 0.1250 ug/kg/min | INTRAVENOUS | Status: DC

## 2011-03-27 MED ORDER — FUROSEMIDE 10 MG/ML IJ SOLN
10.0000 mg | Freq: Four times a day (QID) | INTRAMUSCULAR | Status: AC
  Administered 2011-03-27 – 2011-03-28 (×3): 10 mg via INTRAVENOUS
  Administered 2011-03-28: 03:00:00 via INTRAVENOUS
  Filled 2011-03-27: qty 1
  Filled 2011-03-27: qty 2

## 2011-03-27 MED ORDER — VANCOMYCIN HCL 1000 MG IV SOLR
1250.0000 mg | Freq: Four times a day (QID) | INTRAVENOUS | Status: DC
  Administered 2011-03-27 – 2011-03-28 (×3): 1250 mg via INTRAVENOUS
  Filled 2011-03-27 (×5): qty 1250

## 2011-03-27 NOTE — Progress Notes (Signed)
ANTIBIOTIC CONSULT NOTE - FOLLOW UP  Pharmacy Consult for Vancomycin  Indication: ?HAP  Allergies  Allergen Reactions  . Amoxicillin Other (See Comments)    Yeast Infection   . Morphine And Related Other (See Comments)    Unknown  . Ceftazidime Rash    Patient Measurements: Height: 5\' 5"  (165.1 cm) Weight: 91 lb 14.9 oz (41.7 kg) IBW/kg (Calculated) : 57    Vital Signs: Temp: 100.1 F (37.8 C) (03/10 1212) Temp src: Oral (03/10 1212) BP: 128/82 mmHg (03/10 1100) Pulse Rate: 118  (03/10 1100) Intake/Output from previous day: 03/09 0701 - 03/10 0700 In: 2597 [I.V.:200; NG/GT:360; IV Piggyback:900] Out: -  Intake/Output from this shift: Total I/O In: 604 [I.V.:20; Other:434; NG/GT:100; IV Piggyback:50] Out: -   Labs:  Basename 03/27/11 0430 03/26/11 0517  WBC 7.0 8.5  HGB 8.9* 8.5*  PLT 274 299  LABCREA -- --  CREATININE 0.25* 0.30*   Estimated Creatinine Clearance: 69.5 ml/min (by C-G formula based on Cr of 0.25).  Basename 03/27/11 1330  VANCOTROUGH 10.0  VANCOPEAK --  VANCORANDOM --  GENTTROUGH --  GENTPEAK --  GENTRANDOM --  TOBRATROUGH --  TOBRAPEAK --  TOBRARND --  AMIKACINPEAK --  AMIKACINTROU --  AMIKACIN --     Microbiology: Recent Results (from the past 720 hour(s))  CULTURE, BLOOD (ROUTINE X 2)     Status: Normal   Collection Time   03/17/11 10:16 AM      Component Value Range Status Comment   Specimen Description BLOOD FEMORAL ARTERY COLLECTED BY DOCTOR   Final    Special Requests BOTTLES DRAWN AEROBIC AND ANAEROBIC 5CC   Final    Culture  Setup Time [id number]   Final    Culture     Final    Value: DIPHTHEROIDS(CORYNEBACTERIUM SPECIES)     Note: Standardized susceptibility testing for this organism is not available.     Note: Gram Stain Report Called to,Read Back By and Verified With: HAIRSTON M ON 03/18/11 AT 2040 BY LOY C Performed at [facility]   Report Status 03/20/2011 FINAL   Final   MRSA PCR SCREENING     Status:  Abnormal   Collection Time   03/17/11  2:28 PM      Component Value Range Status Comment   MRSA by PCR POSITIVE (*) NEGATIVE  Final   CULTURE, BLOOD (ROUTINE X 2)     Status: Normal   Collection Time   03/17/11  4:00 PM      Component Value Range Status Comment   Specimen Description BLOOD RIGHT HAND   Final    Special Requests BOTTLES DRAWN AEROBIC AND ANAEROBIC 6CC   Final    Culture NO GROWTH 5 DAYS   Final    Report Status 03/22/2011 FINAL   Final   CULTURE, SPUTUM-ASSESSMENT     Status: Normal   Collection Time   03/17/11  7:06 PM      Component Value Range Status Comment   Specimen Description TRACHEAL ASPIRATE   Final    Special Requests NONE   Final    Sputum evaluation     Final    Value: THIS SPECIMEN IS ACCEPTABLE. RESPIRATORY CULTURE REPORT TO FOLLOW.     Performed at [facility]   Report Status 03/17/2011 FINAL   Final   CULTURE, RESPIRATORY     Status: Normal   Collection Time   03/17/11  7:06 PM      Component Value Range Status Comment  Specimen Description TRACHEAL ASPIRATE   Final    Special Requests NONE   Final    Gram Stain     Final    Value: MODERATE WBC PRESENT,BOTH PMN AND MONONUCLEAR     FEW SQUAMOUS EPITHELIAL CELLS PRESENT     RARE GRAM POSITIVE COCCI IN PAIRS     RARE GRAM POSITIVE RODS   Culture Non-Pathogenic Oropharyngeal-type Flora Isolated.   Final    Report Status 03/20/2011 FINAL   Final   CULTURE, RESPIRATORY     Status: Normal (Preliminary result)   Collection Time   03/26/11 12:15 PM      Component Value Range Status Comment   Specimen Description TRACHEAL ASPIRATE   Final    Special Requests NONE   Final    Gram Stain     Final    Value: FEW WBC PRESENT, PREDOMINANTLY PMN     FEW SQUAMOUS EPITHELIAL CELLS PRESENT     FEW GRAM NEGATIVE RODS     FEW GRAM POSITIVE COCCI IN PAIRS     FEW GRAM POSITIVE RODS   Culture MODERATE GRAM NEGATIVE RODS   Final    Report Status PENDING   Incomplete     Anti-infectives     Start      Dose/Rate Route Frequency Ordered Stop   03/26/11 2200   ceFEPIme (MAXIPIME) 1 g in dextrose 5 % 50 mL IVPB        1 g 100 mL/hr over 30 Minutes Intravenous Every 12 hours 03/26/11 1235     03/26/11 2200   vancomycin (VANCOCIN) 1,250 mg in sodium chloride 0.9 % 250 mL IVPB        1,250 mg 166.7 mL/hr over 90 Minutes Intravenous Every 8 hours 03/26/11 1303     03/26/11 1245   vancomycin (VANCOCIN) 1,250 mg in sodium chloride 0.9 % 250 mL IVPB        1,250 mg 166.7 mL/hr over 90 Minutes Intravenous NOW 03/26/11 1233 03/26/11 1540   03/26/11 1245   ceFEPIme (MAXIPIME) 1 g in dextrose 5 % 50 mL IVPB        1 g 100 mL/hr over 30 Minutes Intravenous NOW 03/26/11 1236 03/26/11 1359   03/26/11 1234   ceFEPIme (MAXIPIME) 1 g in dextrose 5 % 50 mL IVPB  Status:  Discontinued        1 g 100 mL/hr over 30 Minutes Intravenous Every 12 hours 03/26/11 1234 03/26/11 1236   03/24/11 1400   levofloxacin (LEVAQUIN) tablet 750 mg        750 mg Oral Daily 03/24/11 1145 03/26/11 1010   03/21/11 2200   vancomycin (VANCOCIN) 1,250 mg in sodium chloride 0.9 % 250 mL IVPB  Status:  Discontinued        1,250 mg 166.7 mL/hr over 90 Minutes Intravenous Every 6 hours 03/21/11 1736 03/22/11 1120   03/20/11 0900   vancomycin (VANCOCIN) 1,250 mg in sodium chloride 0.9 % 250 mL IVPB  Status:  Discontinued        1,250 mg 166.7 mL/hr over 90 Minutes Intravenous Every 8 hours 03/20/11 0836 03/21/11 1736   03/19/11 1630   aztreonam (AZACTAM) 2 g in dextrose 5 % 50 mL IVPB  Status:  Discontinued        2 g 100 mL/hr over 30 Minutes Intravenous 3 times per day 03/19/11 1535 03/21/11 1423   03/18/11 2000   aztreonam (AZACTAM) 2 g in dextrose 5 % 50 mL IVPB  Status:  Discontinued        2 g 100 mL/hr over 30 Minutes Intravenous 3 times per day 03/18/11 1819 03/19/11 1024   03/18/11 1745   metroNIDAZOLE (FLAGYL) IVPB 500 mg  Status:  Discontinued        500 mg 100 mL/hr over 60 Minutes Intravenous Every 8 hours  03/18/11 1745 03/21/11 1423   03/18/11 1400   Levofloxacin (LEVAQUIN) IVPB 750 mg  Status:  Discontinued        750 mg 100 mL/hr over 90 Minutes Intravenous Every 24 hours 03/17/11 2201 03/24/11 1145   03/18/11 1245   clindamycin (CLEOCIN) IVPB 600 mg  Status:  Discontinued        600 mg 100 mL/hr over 30 Minutes Intravenous 3 times per day 03/18/11 1237 03/18/11 1744   03/18/11 0800   vancomycin (VANCOCIN) 750 mg in sodium chloride 0.9 % 150 mL IVPB  Status:  Discontinued        750 mg 150 mL/hr over 60 Minutes Intravenous Every 8 hours 03/17/11 2211 03/20/11 0833   03/17/11 2300   vancomycin (VANCOCIN) IVPB 1000 mg/200 mL premix        1,000 mg 200 mL/hr over 60 Minutes Intravenous  Once 03/17/11 2211 03/18/11 0005   03/17/11 1400   levofloxacin (LEVAQUIN) IVPB 500 mg  Status:  Discontinued        500 mg 100 mL/hr over 60 Minutes Intravenous Every 24 hours 03/17/11 1346 03/17/11 2201   03/17/11 1345   azithromycin (ZITHROMAX) 500 mg in dextrose 5 % 250 mL IVPB  Status:  Discontinued        500 mg 250 mL/hr over 60 Minutes Intravenous Every 24 hours 03/17/11 1345 03/17/11 2201   03/17/11 1145   moxifloxacin (AVELOX) IVPB 400 mg        400 mg 250 mL/hr over 60 Minutes Intravenous  Once 03/17/11 1142 03/17/11 1257         Assessment:  26 y.o. F with cerebral palsy requiring 24 hour care and chronic trach treated empirically for HCAP on admission and transitioned to Levaquin only to complete treatment (last dose this a.m.). On 3/8 the patient decompensated and was transferred to the MICU, she is febrile and has worsening respiratory status and CXR. Today is D#2 of broadened coverage with Vancomycin/Cefepime for possible HAP.   The patient's SCr is falsely low due to debilitation related to cerebral palsy therefore kinetics are hard to estimate. Vancomycin trough this a.m was SUBtherapeutic (Vanc trough~10, goal of 15-20) Current SCr is 0.25, there is no way to access UOP as the  patient wears diapers and a foley has been refused. Will monitor SCr closely for small changes and will continue to check Vancomycin levels for further dose adjustments. Also, the patient was noted to have flushing with her first dose of Cefepime yesterday however this resolved and there have been no additional s/sx of rash or allergic response with additional doses.   Pharmacy System-Based Medication Review Anticoagulation: SCDs for VTE px (Hep SQ refused by mom) Infectious Disease: Vanc/Cefepime Rx#2 (3/9 >> current) for HAP in the setting of worsening resp secretions and CXR. SCr falsely low d/t debilitation. SCr 0.25, Vanc trough~10 on 1250 q8, will increase however still be conservative. Tmax/24h: 100.7, WBC WNL. New RCx ordered. Resp Cx(2/28): NPF, Bcx(2/28): 1/2 on 2/28 diphtheroids - likely contaminant. MRSA PCR (+). Flushing noted with 1st dose of Cefepime (see allergies and 3/9 note), resolved and no add s/sx of rxn.  -  Aztreonam 3/1 >> 3/4 - Flagyl 3/1 >> 3/4 - Vanc 3/1 >> 3/5 Cardiovascular: BP/24h: 100-130/40-80, HR: 80-130. Lasix added. Gastrointestinal / Nutrition: Hx reflux esophagitis, has PEG for feeding and meds. On protonix PT, glycopyrrolate for secretions. On miralax daily, sorbitol PRN, and sodium phosphate enema PRN. On ensure PT Neurology: Hx cerebral palsy/epilepsy. On home tegretol, depakote, VPA level 71.1 ok; free carbamaz level therapeutic. No active szs.  On baclofen 20mg  PT QID for spasticity. No sedation, GCS~6  Nephrology: Bump in SCr yesterday to 0.3, now back down to 0.25. ,Scr is falsely low d/t debilitation so small changes reflect bit changes in renal fxn. No UOP recorded d/t pt wearing diapers. Na 132, other lytes ok. On  free water PT Q12 (may d/c due to volume overload) Pulmonary: Transferred back to the MICU on 3/8 d/t decompensation. CXR (3/8): worsened aeration in R lung. On trach collar 93% at fiO2 40%. On xopenex scheduled.  Hemaology / Oncology: CBC  stable PTA Meds: Alprazolam PRN not yet resumed. Best Practice: MC, SCDs, PPI   Goal of Therapy:  Vancomycin trough level 15-20 mcg/ml  Plan:  1. Increase Vancomycin 1250 mg IV every 6 hours  2. Will check Vanc level at steady state to determine additional dose adjustments.  3. Continue Cefepime 1g IV every 12 hours 4. Will continue to follow renal function, culture results, LOT, and antibiotic de-escalation plans   Georgina Pillion, PharmD, BCPS Clinical Pharmacist Pager: [phone] 03/27/2011 4:14 PM

## 2011-03-27 NOTE — Progress Notes (Signed)
Name: [name] MRN: [id number] DOB: 07/16/1985    LOS: 10  PCCM FOLLOW UP NOTE   History of Present Illness:  26 y/o F with PMH of cerebral palsy requiring 24 hour care, epilepsy, reflux esophagitis s/p feeding tube and chronic trach admitted to [facility] on 2/28 with fever, increased resp secretions, increased O2 requirement.  CXR demonstrated R sided infiltrate concerning for PNA.  Patient transferred to [facility] 3/1 for further care.    Lines / Drains: Pediatric cuffless trach (pta)>>>3/1 #4 cuffless 3/1>>>3/2     #6 ETT/trach 3/2 >> 3/3 Bivona trach 3/3 >>  RUE PICC 3/3 >>  Cultures: 2/28 MRSA PCR >> POS 2/28 Blood>>> 1/2 Diphtheroids (contaminant) 2/28 Sputum >> normal flora  3/9 sputum>>>  Antibiotics:   2/28 Clinda>>>3/1 3/1  Aztreonam (Pseudomonas)>>> 3/4 3/1  Flagyl (Anaerobes)>>> 3/4 2/28 Vanco >> 3/5 2/28 Levaquin >> 3/10 (stop date ordered) 3/9 vanc>>> 3/9 cefipime>>>  Tests / Events: 3/1  Transferred from [facility] with pneumonia 3/7 transferred to SDU vent bed 3/8- decompensates back to unit and vent support 3/9- remained on trach collar, desats this am   Vital Signs: Filed Vitals:   03/27/11 0700 03/27/11 0739 03/27/11 0800 03/27/11 0900  BP: 96/59  92/54 114/77  Pulse: 100  97 109  Temp:  98.9 F (37.2 C)    TempSrc:  Oral    Resp: 17  18 29   Height:      Weight:      SpO2: 97%  98% 93%    Intake/Output Summary (Last 24 hours) at 03/27/11 1021 Last data filed at 03/27/11 1000  Gross per 24 hour  Intake   2739 ml  Output      0 ml  Net   2739 ml    Physical Examination: General: chronically ill, cerebral palsy contractures, trach collar on going Neuro: awake CV: s1s2 tachy at rest (baseline), regular PULM: ronchi, no wheeze, secretions thin excessive GI: round / soft, bsx4 active, PEG Extremities: Warm/dry, contracted   Ventilator settings: Vent Mode:  [-]  FiO2 (%):  [40 %] 40 %  Labs   CBC    Component Value Date/Time   WBC  7.0 03/27/2011 0430   RBC 3.34* 03/27/2011 0430   HGB 8.9* 03/27/2011 0430   HCT 27.5* 03/27/2011 0430   PLT 274 03/27/2011 0430   MCV 82.3 03/27/2011 0430   MCH 26.6 03/27/2011 0430   MCHC 32.4 03/27/2011 0430   RDW 13.3 03/27/2011 0430   LYMPHSABS 2.3 03/17/2011 1050   MONOABS 1.3* 03/17/2011 1050   EOSABS 0.0 03/17/2011 1050   BASOSABS 0.0 03/17/2011 1050    BMET    Component Value Date/Time   NA 132* 03/27/2011 0430   K 3.8 03/27/2011 0430   CL 97 03/27/2011 0430   CO2 29 03/27/2011 0430   GLUCOSE 107* 03/27/2011 0430   BUN 8 03/27/2011 0430   CREATININE 0.25* 03/27/2011 0430   CALCIUM 8.6 03/27/2011 0430   GFRNONAA >90 03/27/2011 0430   GFRAA >90 03/27/2011 0430   Radiology: CXR: improved LLL ATX, chronic  Assessment and Plan:  Acute on chronic respiratory failure due to pneumonia in setting of long term tracheostomy.  PLAN -  -Now with Bivona trach. Copious watery secretions. Will not change trach since patient has excessive secretion. -Cont chest percussion vest BID (started 3/6) -Cont glycopyrrolate for copious thin secretions, remain on tele -remained on trach collar all night, assuming RT impression was doing well -to chair daily as  goal -pos balance again noted, will re add lasix low dose frequent -pcxr in am  See ID for ABX Goal trach collar 24 hrs again  Pneumonia -  PLAN -  Complete 10 days Levaquin - stop date ordered Then fevers, desat required vent 3/9, re added VAP coverage, cefipime , vanc Sputum sent follow  Cerebral palsy.  History of seizures.  No active seizures. PLAN -  -Continue preadmission medications No seizures noted last 24 hrs  GERD, dysphagia, s/p PEG PLAN -  -TFs to bolus feedings on home regimen -Cont Protonix  Thrombocytopenia- resolved.  Lab 03/27/11 0430 03/26/11 0517 03/24/11 0500 03/21/11 0500  PLT 274 299 193 66*    Updated mom extensive daily  Best practices / Disposition: -->Full Code -->DVT Px: scd's -->GI Px:  protonix -->Diet: PEG feeding  Ccm 30 min   Mcarthur Rossetti. Tyson Alias, MD, FACP Pgr: [phone] [facility]

## 2011-03-28 ENCOUNTER — Inpatient Hospital Stay (HOSPITAL_COMMUNITY): Payer: Medicare Other

## 2011-03-28 LAB — CULTURE, RESPIRATORY W GRAM STAIN

## 2011-03-28 LAB — BASIC METABOLIC PANEL
BUN: 10 mg/dL (ref 6–23)
CO2: 32 mEq/L (ref 19–32)
GFR calc non Af Amer: >90 mL/min (ref >90)
Glucose, Bld: 106 mg/dL — ABNORMAL HIGH (ref 70–99)
Potassium: 4 mEq/L (ref 3.5–5.1)
Sodium: 135 mEq/L (ref 135–145)

## 2011-03-28 LAB — CBC
Hemoglobin: 9.1 g/dL — ABNORMAL LOW (ref 12.0–15.0)
MCH: 26.5 pg (ref 26.0–34.0)
MCV: 84.5 fL (ref 78.0–100.0)
RBC: 3.43 MIL/uL — ABNORMAL LOW (ref 3.87–5.11)
WBC: 5.4 10*3/uL (ref 4.0–10.5)

## 2011-03-28 LAB — DIFFERENTIAL
Eosinophils Absolute: 0.1 10*3/uL (ref 0.0–0.7)
Eosinophils Relative: 1 % (ref 0–5)
Lymphocytes Relative: 34 % (ref 12–46)
Lymphs Abs: 1.8 10*3/uL (ref 0.7–4.0)
Monocytes Relative: 16 % — ABNORMAL HIGH (ref 3–12)
Neutrophils Relative %: 49 % (ref 43–77)

## 2011-03-28 MED ORDER — WHITE PETROLATUM GEL
Status: AC
  Administered 2011-03-28: 16:00:00
  Filled 2011-03-28: qty 5

## 2011-03-28 NOTE — Progress Notes (Signed)
Name: CADYNCE GARRETTE MRN: [id number] DOB: [birth year]    LOS: 11  PCCM FOLLOW UP NOTE   History of Present Illness:  26 y/o F with PMH of cerebral palsy requiring 24 hour care, epilepsy, reflux esophagitis s/p feeding tube and chronic trach admitted to [facility] on 2/28 with fever, increased resp secretions, increased O2 requirement.  CXR demonstrated R sided infiltrate concerning for PNA.  Patient transferred to [facility] 3/1 for further care.    Lines / Drains: Pediatric cuffless trach (pta)>>>3/1 #4 cuffless 3/1>>>3/2     #6 ETT/trach 3/2 >> 3/3 Bivona trach 3/3 >>  RUE PICC 3/3 >>  Cultures: 2/28 MRSA PCR >> POS 2/28 Blood>>> 1/2 Diphtheroids (contaminant) 2/28 Sputum >> normal flora  3/9 sputum>>>moderate pseudomonas  Antibiotics:   2/28 Clinda>>>3/1 3/1  Aztreonam (Pseudomonas)>>> 3/4 3/1  Flagyl (Anaerobes)>>> 3/4 2/28 Vanco >> 3/5 2/28 Levaquin >> 3/10 (stop date ordered) 3/9 vanc>>>3/11 3/9 cefipime>>>(res levo, cipro)>>> plan 14 days  Tests / Events: 3/1  Transferred from [facility] with pneumonia 3/7 transferred to SDU vent bed 3/8- decompensates back to unit and vent support 3/9- remained on trach collar, desats this am  3/10- back to vent 3/11- sputum pseudomonas, likely neg balance   Vital Signs: Filed Vitals:   03/28/11 1100 03/28/11 1124 03/28/11 1200 03/28/11 1208  BP: 97/57  98/53 98/53  Pulse: 85  76 105  Temp:  99 F (37.2 C)    TempSrc:  Oral    Resp: 20  20 19   Height:      Weight:      SpO2: 100%  99% 95%    Intake/Output Summary (Last 24 hours) at 03/28/11 1355 Last data filed at 03/28/11 1200  Gross per 24 hour  Intake   2306 ml  Output      0 ml  Net   2306 ml    Physical Examination: General: chronically ill, cerebral palsy contractures, trach collar on going Neuro: awake CV: s1s2 tachy at rest (baseline), regular PULM: ronchi reduced, secretions less [place] GI: round / soft, bsx4 active, PEG Extremities: Warm/dry,  contracted   Ventilator settings: Vent Mode:  [-] PSV FiO2 (%):  [40 %] 40 % PEEP:  [5 cmH20] 5 cmH20 Pressure Support:  [14 cmH20] 14 cmH20 Plateau Pressure:  [16 cmH20-20 cmH20] 18 cmH20  Labs   CBC    Component Value Date/Time   WBC 5.4 03/28/2011 0430   RBC 3.43* 03/28/2011 0430   HGB 9.1* 03/28/2011 0430   HCT 29.0* 03/28/2011 0430   PLT 339 03/28/2011 0430   MCV 84.5 03/28/2011 0430   MCH 26.5 03/28/2011 0430   MCHC 31.4 03/28/2011 0430   RDW 13.5 03/28/2011 0430   LYMPHSABS 1.8 03/28/2011 0430   MONOABS 0.9 03/28/2011 0430   EOSABS 0.1 03/28/2011 0430   BASOSABS 0.0 03/28/2011 0430    BMET    Component Value Date/Time   NA 135 03/28/2011 0430   K 4.0 03/28/2011 0430   CL 95* 03/28/2011 0430   CO2 32 03/28/2011 0430   GLUCOSE 106* 03/28/2011 0430   BUN 10 03/28/2011 0430   CREATININE 0.24* 03/28/2011 0430   CALCIUM 8.7 03/28/2011 0430   GFRNONAA >90 03/28/2011 0430   GFRAA >90 03/28/2011 0430   Radiology: CXR: improved LLL ATX, chronic  Assessment and Plan:  Acute on chronic respiratory failure due to pneumonia in setting of long term tracheostomy.  R/p VAP, pseudomonas PLAN -  -keep bovona trach Complete 14 days cefipime  Trach colar re attempts today, favor nocturnal support mandatory Secretions also improved with lasix pcxr improved with lasix and antipseudompnal agent pcxr in am  Trach collar 8-10 hrs goal  Pneumonia - r/o VAP PLAN - maintain cefepime x 14 days Dc vanc   Cerebral palsy.  History of seizures.  No active seizures. PLAN -  -Continue preadmission medications No seizures noted last 24 hrs  GERD, dysphagia, s/p PEG PLAN -  -TFs to bolus feedings on home regimen, increased residual?, increased bowels? kub -Cont Protonix   Updated mom extensive daily, on phone today  Best practices / Disposition: -->Full Code -->DVT Px: scd's -->GI Px: protonix -->Diet: PEG feeding  Mcarthur Rossetti. Tyson Alias, MD, FACP Pgr: [phone] [facility]

## 2011-03-28 NOTE — Progress Notes (Signed)
Clinical Social Worker reviewed chart and spoke with RNCM.  Current plan of care is for pt to dc home with home health services.  CSW signing off at this time; please re consult if needed.   Angelia Mould, MSW, [place] [phone]

## 2011-03-28 NOTE — Progress Notes (Signed)
eLink Physician-Brief Progress Note Patient Name: [name] DOB: 02-26-1985 MRN: [id number]  Date of Service  03/28/2011   HPI/Events of Note   kub shows ileus  eICU Interventions  Peg to LIS, hold TFs & meds  Until am rounds   Intervention Category Intermediate Interventions: Diagnostic test evaluation  ALVA,RAKESH V. 03/28/2011, 10:56 PM

## 2011-03-28 NOTE — Progress Notes (Signed)
PHARMACY - CRITICAL CARE PROGRESS NOTE  Pharmacy Consult for Cefepime Indication: Pseudomonas VAP   Allergies  Allergen Reactions  . Amoxicillin Other (See Comments)    Yeast Infection   . Morphine And Related Other (See Comments)    Unknown  . Ceftazidime Rash    Patient Measurements: Height: 5\' 5"  (165.1 cm) Weight: 91 lb 14.9 oz (41.7 kg) IBW/kg (Calculated) : 57   Vital Signs: Temp: 99 F (37.2 C) (03/11 1124) Temp src: Oral (03/11 1124) BP: 98/53 mmHg (03/11 1208) Pulse Rate: 105  (03/11 1208) Intake/Output from previous day: 03/10 0701 - 03/11 0700 In: 2590 [I.V.:220; NG/GT:400; IV Piggyback:768] Out: -  Intake/Output from this shift: Total I/O In: 350 [I.V.:50; IV Piggyback:300] Out: -  Vent settings for last 24 hours: Vent Mode:  [-] PSV FiO2 (%):  [40 %] 40 % PEEP:  [5 cmH20] 5 cmH20 Pressure Support:  [14 cmH20] 14 cmH20 Plateau Pressure:  [16 cmH20-20 cmH20] 18 cmH20  Labs:  Basename 03/28/11 0430 03/27/11 0430 03/26/11 0517  WBC 5.4 7.0 8.5  HGB 9.1* 8.9* 8.5*  HCT 29.0* 27.5* 26.1*  PLT 339 274 299  APTT -- -- --  INR -- -- --  CREATININE 0.24* 0.25* 0.30*  LABCREA -- -- --  CREATININE 0.24* 0.25* 0.30*  LABCREA -- -- --  CREAT24HRUR -- -- --  MG -- -- 2.2  PHOS -- -- 4.8*  ALBUMIN -- -- --  PROT -- -- --  AST -- -- --  ALT -- -- --  ALKPHOS -- -- --  BILITOT -- -- --  BILIDIR -- -- --  IBILI -- -- --   Estimated Creatinine Clearance: 69.5 ml/min (by C-G formula based on Cr of 0.24).  No results found for this basename: GLUCAP:3 in the last 72 hours  Microbiology: Recent Results (from the past 720 hour(s))  CULTURE, BLOOD (ROUTINE X 2)     Status: Normal   Collection Time   03/17/11 10:16 AM      Component Value Range Status Comment   Specimen Description BLOOD FEMORAL ARTERY COLLECTED BY DOCTOR   Final    Special Requests BOTTLES DRAWN AEROBIC AND ANAEROBIC 5CC   Final    Culture  Setup Time [id number]   Final    Culture      Final    Value: DIPHTHEROIDS(CORYNEBACTERIUM SPECIES)     Note: Standardized susceptibility testing for this organism is not available.     Note: Gram Stain Report Called to,Read Back By and Verified With: HAIRSTON M ON 03/18/11 AT 2040 BY LOY C Performed at [facility]   Report Status 03/20/2011 FINAL   Final   MRSA PCR SCREENING     Status: Abnormal   Collection Time   03/17/11  2:28 PM      Component Value Range Status Comment   MRSA by PCR POSITIVE (*) NEGATIVE  Final   CULTURE, BLOOD (ROUTINE X 2)     Status: Normal   Collection Time   03/17/11  4:00 PM      Component Value Range Status Comment   Specimen Description BLOOD RIGHT HAND   Final    Special Requests BOTTLES DRAWN AEROBIC AND ANAEROBIC [facility]   Final    Culture NO GROWTH 5 DAYS   Final    Report Status 03/22/2011 FINAL   Final   CULTURE, SPUTUM-ASSESSMENT     Status: Normal   Collection Time   03/17/11  7:06 PM  Component Value Range Status Comment   Specimen Description TRACHEAL ASPIRATE   Final    Special Requests NONE   Final    Sputum evaluation     Final    Value: THIS SPECIMEN IS ACCEPTABLE. RESPIRATORY CULTURE REPORT TO FOLLOW.     Performed at [facility]   Report Status 03/17/2011 FINAL   Final   CULTURE, RESPIRATORY     Status: Normal   Collection Time   03/17/11  7:06 PM      Component Value Range Status Comment   Specimen Description TRACHEAL ASPIRATE   Final    Special Requests NONE   Final    Gram Stain     Final    Value: MODERATE WBC PRESENT,BOTH PMN AND MONONUCLEAR     FEW SQUAMOUS EPITHELIAL CELLS PRESENT     RARE GRAM POSITIVE COCCI IN PAIRS     RARE GRAM POSITIVE RODS   Culture Non-Pathogenic Oropharyngeal-type Flora Isolated.   Final    Report Status 03/20/2011 FINAL   Final   CULTURE, RESPIRATORY     Status: Normal   Collection Time   03/26/11 12:15 PM      Component Value Range Status Comment   Specimen Description TRACHEAL ASPIRATE   Final    Special Requests NONE   Final     Gram Stain     Final    Value: FEW WBC PRESENT, PREDOMINANTLY PMN     FEW SQUAMOUS EPITHELIAL CELLS PRESENT     FEW GRAM NEGATIVE RODS     FEW GRAM POSITIVE COCCI IN PAIRS     FEW GRAM POSITIVE RODS   Culture MODERATE PSEUDOMONAS AERUGINOSA   Final    Report Status 03/28/2011 FINAL   Final    Organism ID, Bacteria PSEUDOMONAS AERUGINOSA   Final     Medications:  Scheduled:    . acyclovir cream   Topical QID  . antiseptic oral rinse  15 mL Mouth Rinse QID  . baclofen  20 mg Per Tube QID  . carbamazepine  200 mg Oral Daily   And  . carbamazepine  300 mg Oral Custom  . ceFEPime (MAXIPIME) IV  1 g Intravenous Q12H  . chlorhexidine  15 mL Mouth/Throat BID  . feeding supplement  237 mL Per Tube QID  . free water  100 mL Per Tube Q12H  . furosemide  10 mg Intravenous Q6H  . glycopyrrolate  2.5 mg Per Tube BID  . levalbuterol  6 puff Inhalation Q6H  . pantoprazole sodium  40 mg Per Tube Daily  . polyethylene glycol  17 g Per Tube Daily  . potassium chloride  40 mEq Per Tube Once  . silver sulfADIAZINE  1 application Topical Daily  . Valproic Acid  625 mg Per Tube TID  . vancomycin  1,250 mg Intravenous Q6H  . DISCONTD: vancomycin  1,250 mg Intravenous Q8H   Infusions:    . sodium chloride Stopped (03/24/11 1600)   PRN: acetaminophen, hydrocortisone cream, levalbuterol, mupirocin ointment, nystatin ointment, sodium chloride, sodium phosphate, sorbitol, zolpidem  Assessment: Admit Complaint: 26 y/o F with PMH of cerebral palsy requiring 24 hour care, epilepsy, reflux esophagitis s/p feeding tube and chronic trach admitted to [facility] on 2/28 with complaints of fever, increased suctioning, increase in O2 requirement and chest congestion. Transferred to [facility] ICU on 3/1. Transferred to SDU on 3/7, but became too unstable>> transferred back to ICU on 3/8.   Anticoagulation: SCDs for VTE px (Hep SQ refused by  mom)  Infectious Disease: Cefepime D#3 for Pseudomonas VAP (planned for 14 days)  in the setting of worsening resp secretions and CXR. SCr falsely low d/t debilitation (0.24), Vanc trough ~10 on 1250 q8, increased to 1250 q6. Vanc d/ced 3/11. Currently afebrile, WBC WNL. Resp Cx (3/9): Pseudomonas sensitive to cefepime. Resp Cx(2/28): NPF, Bcx(2/28): 1/2 on 2/28 diphtheroids - likely contaminant. MRSA PCR (+). Flushing noted with 1st dose of Cefepime (see allergies and 3/9 note), resolved and no add s/sx of rxn.  - Aztreonam 3/1 >> 3/4 - Flagyl 3/1 >> 3/4 - Vanc 3/1 >> 3/5, 3/9 >> 3/11 -Cefepime 3/9 >> current  Cardiovascular: Hypotension. HR 69-117. Lasix scheduled d/t volume overload.  Gastrointestinal / Nutrition: Hx reflux esophagitis, has PEG for feeding and meds. Emesis this morning. On protonix PT, glycopyrrolate for secretions. On miralax daily, sorbitol PRN, and sodium phosphate enema PRN. On Ensure PT. LBM: 3/10.   Neurology: Hx cerebral palsy/epilepsy. On home tegretol, depakote, VPA level 71.1 ok; free carbamaz level therapeutic. No active szs. On baclofen 20mg  PT QID for spasticity. No sedation, GCS 10. RASS 0.   Nephrology: Scr is falsely low d/t debilitation. No UOP recorded d/t pt wearing diapers. Lytes ok. On free water PT Q12 (Na WNL).   Pulmonary: Transferred back to the MICU on 3/8 d/t decompensation. CXR (3/8): worsened aeration in R lung. On trach collar 95% at fiO2 40%. On xopenex scheduled.   Hematology / Oncology: Hgb/Hct/Plt increasing.   PTA Meds: Alprazolam PRN not yet resumed.  Best Practice: MC, SCDs, PPI  Plan:  1. Continue cefepime 1g IV Q12 for total regimen of 14 days. 2. Will continue to monitor medication regimen to ensure appropriateness.  Delphia Grates, PharmD Candidate 03/28/2011,1:00 PM  I agree with the above assessment and recommendations. Rolland Porter, Pharm.D., BCPS Clinical Pharmacist Pager: [phone]

## 2011-03-28 NOTE — Progress Notes (Signed)
UR Completed.  Demetrion Wesby Jane [phone] 03/28/2011  

## 2011-03-29 ENCOUNTER — Inpatient Hospital Stay (HOSPITAL_COMMUNITY): Payer: Medicare Other

## 2011-03-29 DIAGNOSIS — J189 Pneumonia, unspecified organism: Principal | ICD-10-CM

## 2011-03-29 DIAGNOSIS — G40909 Epilepsy, unspecified, not intractable, without status epilepticus: Secondary | ICD-10-CM

## 2011-03-29 DIAGNOSIS — G809 Cerebral palsy, unspecified: Secondary | ICD-10-CM

## 2011-03-29 DIAGNOSIS — J9600 Acute respiratory failure, unspecified whether with hypoxia or hypercapnia: Secondary | ICD-10-CM

## 2011-03-29 LAB — POCT I-STAT 3, ART BLOOD GAS (G3+)
Acid-Base Excess: 6 mmol/L — ABNORMAL HIGH (ref 0.0–2.0)
O2 Saturation: 98 %
TCO2: 34 mmol/L (ref 0–100)

## 2011-03-29 LAB — BASIC METABOLIC PANEL
Calcium: 8.8 mg/dL (ref 8.4–10.5)
Creatinine, Ser: 0.23 mg/dL — ABNORMAL LOW (ref 0.50–1.10)
GFR calc Af Amer: >90 mL/min (ref >90)

## 2011-03-29 LAB — PHOSPHORUS: Phosphorus: 4.1 mg/dL (ref 2.3–4.6)

## 2011-03-29 MED ORDER — SODIUM CHLORIDE 0.9 % IV BOLUS (SEPSIS)
250.0000 mL | Freq: Once | INTRAVENOUS | Status: AC
  Administered 2011-03-29: 250 mL via INTRAVENOUS

## 2011-03-29 MED ORDER — SODIUM CHLORIDE 0.9 % IV BOLUS (SEPSIS)
500.0000 mL | Freq: Once | INTRAVENOUS | Status: AC
  Administered 2011-03-29: 500 mL via INTRAVENOUS

## 2011-03-29 NOTE — Progress Notes (Signed)
Name: [name] MRN: [id number] DOB: April 26, 1985    LOS: 12  PCCM FOLLOW UP NOTE   History of Present Illness:  26 y/o F with PMH of cerebral palsy requiring 24 hour care, epilepsy, reflux esophagitis s/p feeding tube and chronic trach admitted to [facility] on 2/28 with fever, increased resp secretions, increased O2 requirement.  CXR demonstrated R sided infiltrate concerning for PNA.  Patient transferred to [facility] 3/1 for further care.    Lines / Drains: Pediatric cuffless trach (pta)>>>3/1 #4 cuffless 3/1>>>3/2     #6 ETT/trach 3/2 >> 3/3 Bivona trach 3/3 >>  RUE PICC 3/3 >>  Cultures: 2/28 MRSA PCR >> POS 2/28 Blood>>> 1/2 Diphtheroids (contaminant) 2/28 Sputum >> normal flora  3/9 sputum>>>moderate pseudomonas  Antibiotics:   2/28 Clinda>>>3/1 3/1  Aztreonam (Pseudomonas)>>> 3/4 3/1  Flagyl (Anaerobes)>>> 3/4 2/28 Vanco >> 3/5 2/28 Levaquin >> 3/10 (stop date ordered) 3/9 vanc>>>3/11 3/9 cefipime>>>(res levo, cipro)>>> plan 14 days  Tests / Events: 3/1  Transferred from [facility] with pneumonia 3/7 transferred to SDU vent bed 3/8- decompensates back to unit and vent support 3/9- remained on trach collar, desats this am  3/10- back to vent 3/11- sputum pseudomonas, likely neg balance 3/11 kub- ileus  3/12- drop in BP, fluids provided  Vital Signs: Filed Vitals:   03/29/11 0945 03/29/11 1000 03/29/11 1124 03/29/11 1136  BP: 82/41 84/47 74/43    Pulse: 55 61 84   Temp:    98.8 F (37.1 C)  TempSrc:    Oral  Resp: 16 19 26    Height:      Weight:      SpO2: 98% 98% 94%     Intake/Output Summary (Last 24 hours) at 03/29/11 1302 Last data filed at 03/29/11 1028  Gross per 24 hour  Intake    820 ml  Output      0 ml  Net    820 ml    Physical Examination: General: chronically ill, cerebral palsy contractures Neuro: awakens , eyes open more today CV: s1s2 tachy at rest (baseline), regular PULM: coarse GI: round / soft, bsx4 active, PEG Extremities:  Warm/dry, contracted   Ventilator settings: Vent Mode:  [-] CPAP FiO2 (%):  [40 %] 40 % PEEP:  [5 cmH20] 5 cmH20 Pressure Support:  [14 cmH20] 14 cmH20  Labs   CBC    Component Value Date/Time   WBC 5.4 03/28/2011 0430   RBC 3.43* 03/28/2011 0430   HGB 9.1* 03/28/2011 0430   HCT 29.0* 03/28/2011 0430   PLT 339 03/28/2011 0430   MCV 84.5 03/28/2011 0430   MCH 26.5 03/28/2011 0430   MCHC 31.4 03/28/2011 0430   RDW 13.5 03/28/2011 0430   LYMPHSABS 1.8 03/28/2011 0430   MONOABS 0.9 03/28/2011 0430   EOSABS 0.1 03/28/2011 0430   BASOSABS 0.0 03/28/2011 0430    BMET    Component Value Date/Time   NA 137 03/29/2011 0500   K 3.7 03/29/2011 0500   CL 98 03/29/2011 0500   CO2 32 03/29/2011 0500   GLUCOSE 93 03/29/2011 0500   BUN 12 03/29/2011 0500   CREATININE 0.23* 03/29/2011 0500   CALCIUM 8.8 03/29/2011 0500   GFRNONAA >90 03/29/2011 0500   GFRAA >90 03/29/2011 0500   Radiology: CXR: improved int changes, elevated chronic diaphragm  Assessment and Plan:  Acute on chronic respiratory failure due to pneumonia in setting of long term tracheostomy.  R/p VAP, pseudomonas PLAN -  -keep bovona trach Complete 14 days  cefipime Shock, now back to support PS , abg ordered and reviewed After BP improved, consider re attempt trach collar pcxr overall improved  Pneumonia - r/o VAP PLAN - maintain cefepime x 14 days Afebrile pcxr improved overall   Cerebral palsy.  History of seizures.  No active seizures. PLAN -  -Continue preadmission medications  GERD, dysphagia, s/p PEG ileus PLAN -  -TFs to bolus feedings on home regimen, increased residual?, increased bowels? kub reviewed, consider reglan if residuals / vomiting 3/12 None today  Hypotension R/o hypovolemia Bolus goal MAP 55 Dc lasix Have no idea of I/O as diapers Levophed if map not responsive cortisol and stress roids may be needed  Updated mom extensive daily, on phone today  Best practices / Disposition: -->Full  Code -->DVT Px: scd's -->GI Px: protonix -->Diet: PEG feeding  Ccm 45 min    Mcarthur Rossetti. Tyson Alias, MD, FACP Pgr: [phone] [place] [facility]

## 2011-03-30 ENCOUNTER — Inpatient Hospital Stay (HOSPITAL_COMMUNITY): Payer: Medicare Other

## 2011-03-30 LAB — BASIC METABOLIC PANEL
BUN: 10 mg/dL (ref 6–23)
Chloride: 97 mEq/L (ref 96–112)
Creatinine, Ser: 0.22 mg/dL — ABNORMAL LOW (ref 0.50–1.10)
GFR calc Af Amer: >90 mL/min (ref >90)
Glucose, Bld: 98 mg/dL (ref 70–99)

## 2011-03-30 MED ORDER — SODIUM CHLORIDE 0.9 % IJ SOLN
INTRAMUSCULAR | Status: AC
  Administered 2011-03-30: 20 mL
  Filled 2011-03-30: qty 20

## 2011-03-30 NOTE — Progress Notes (Signed)
Nutrition Follow-up  Patient remains on ventilator.    Diet Order:  Bolus TF via PEG:  Ensure Clinical Strength 1 can/bottle QID (1400 kcals, 52 grams protein, 720 ml free water daily)  Free water flushes:  100 ml BID  Meds: Scheduled Meds:   . acyclovir cream   Topical QID  . antiseptic oral rinse  15 mL Mouth Rinse QID  . baclofen  20 mg Per Tube QID  . carbamazepine  200 mg Oral Daily   And  . carbamazepine  300 mg Oral Custom  . ceFEPime (MAXIPIME) IV  1 g Intravenous Q12H  . chlorhexidine  15 mL Mouth/Throat BID  . feeding supplement  237 mL Per Tube QID  . free water  100 mL Per Tube Q12H  . glycopyrrolate  2.5 mg Per Tube BID  . levalbuterol  6 puff Inhalation Q6H  . pantoprazole sodium  40 mg Per Tube Daily  . polyethylene glycol  17 g Per Tube Daily  . silver sulfADIAZINE  1 application Topical Daily  . sodium chloride  500 mL Intravenous Once  . Valproic Acid  625 mg Per Tube TID   Continuous Infusions:   . sodium chloride Stopped (03/29/11 1400)   PRN Meds:.acetaminophen, hydrocortisone cream, levalbuterol, mupirocin ointment, nystatin ointment, sodium chloride, sodium phosphate, sorbitol, zolpidem  Labs:  CMP     Component Value Date/Time   NA 134* 03/30/2011 0455   K 4.1 03/30/2011 0455   CL 97 03/30/2011 0455   CO2 30 03/30/2011 0455   GLUCOSE 98 03/30/2011 0455   BUN 10 03/30/2011 0455   CREATININE 0.22* 03/30/2011 0455   CALCIUM 9.2 03/30/2011 0455   PROT 6.4 03/18/2011 0700   ALBUMIN 2.8* 03/18/2011 0700   AST 22 03/18/2011 0700   ALT 6 03/18/2011 0700   ALKPHOS 50 03/18/2011 0700   BILITOT 0.1* 03/18/2011 0700   GFRNONAA >90 03/30/2011 0455   GFRAA >90 03/30/2011 0455    Intake/Output Summary (Last 24 hours) at 03/30/11 1043 Last data filed at 03/30/11 1004  Gross per 24 hour  Intake 1896.33 ml  Output      0 ml  Net 1896.33 ml    Weight Status:  39.2 kg continues to trend down  Re-estimated needs, unchanged:  1250-1450 kcals, 55-70 grams protein  daily.  Nutrition Dx:  No new nutrition diagnosis at this time.  Goal:  Meet >/= 90% of estimated nutrition needs, met.  Intervention:    Continue Ensure Clinical Strength 1 can/bottle QID with 100 ml free water BID to meet nutrition needs.    Recommend changing back to home TF formula (Ensure Plus) when discharged.  Monitor:  TF tolerance/adequacy, overall goals of care, labs, weight trend.   Hettie Holstein Pager #:  [phone]

## 2011-03-30 NOTE — Progress Notes (Signed)
Received patient at 1130, handoff report given by Fleet Contras, RN.  Patient noted to be stable at handoff, resting comfortably, VS stable, no S/S of pain noted. Patient's mother noted at bedside.  Initial assessment completed, noted with GCS of 11T, appears awake, opens eyes spontaneously, not following commands.  Noted with boniva trach, attached to trach collar, oxygen saturation greater than 93%. Abdomen soft with active bowel sounds, noted with PEG.  Peri care completed, and patient placed up in chair.  VS remain stable. Handoff report given to [facility], RN at 1400.

## 2011-03-30 NOTE — Progress Notes (Signed)
Name: [name] MRN: [id number] DOB: 03/25/1985    LOS: 13  PCCM FOLLOW UP NOTE   History of Present Illness:  26 y/o F with PMH of cerebral palsy requiring 24 hour care, epilepsy, reflux esophagitis s/p feeding tube and chronic trach admitted to [facility] on 2/28 with fever, increased resp secretions, increased O2 requirement.  CXR demonstrated R sided infiltrate concerning for PNA.  Patient transferred to [facility] 3/1 for further care.    Lines / Drains: Pediatric cuffless trach (pta)>>>3/1 #4 cuffless 3/1>>>3/2     #6 ETT/trach 3/2 >> 3/3 Bivona trach 3/3 >> RUE PICC 3/3 >>  Cultures: 2/28 MRSA PCR >> POS 2/28 Blood>>> 1/2 Diphtheroids (contaminant) 2/28 Sputum >> normal flora  3/9 sputum>>>moderate pseudomonas  Antibiotics:   2/28 Clinda>>>3/1 3/1  Aztreonam (Pseudomonas)>>> 3/4 3/1  Flagyl (Anaerobes)>>> 3/4 2/28 Vanco >> 3/5 2/28 Levaquin >> 3/10 (stop date ordered) 3/9 vanc>>>3/11 3/9 cefipime>>>(res levo, cipro)>>> plan 14 days  Tests / Events: 3/1  Transferred from [facility] with pneumonia 3/7 transferred to SDU vent bed 3/8- decompensates back to unit and vent support 3/9- remained on trach collar, desats this am  3/10- back to vent 3/11- sputum pseudomonas, likely neg balance 3/11 kub- ileus  3/12- drop in BP, fluids provided 3/13- trach collar done successful over night  Vital Signs: Filed Vitals:   03/30/11 1000 03/30/11 1100 03/30/11 1145 03/30/11 1200  BP: 116/79 128/69  113/64  Pulse: 97 116  99  Temp:   98.3 F (36.8 C)   TempSrc:   Axillary   Resp: 30 31  23   Height:      Weight:      SpO2: 95% 87%  97%    Intake/Output Summary (Last 24 hours) at 03/30/11 1242 Last data filed at 03/30/11 1100  Gross per 24 hour  Intake   1821 ml  Output      0 ml  Net   1821 ml    Physical Examination: General: chronically ill, cerebral palsy contractures Neuro: awakens , eyes open more today CV: s1s2 tachy at rest (baseline), regular PULM: coarse  improved, secretions again noted baseline? GI: round / soft, bsx4 active, PEG Extremities: Warm/dry, contracted   Ventilator settings: Vent Mode:  [-]  FiO2 (%):  [35 %-40 %] 35 %  Labs   CBC    Component Value Date/Time   WBC 5.4 03/28/2011 0430   RBC 3.43* 03/28/2011 0430   HGB 9.1* 03/28/2011 0430   HCT 29.0* 03/28/2011 0430   PLT 339 03/28/2011 0430   MCV 84.5 03/28/2011 0430   MCH 26.5 03/28/2011 0430   MCHC 31.4 03/28/2011 0430   RDW 13.5 03/28/2011 0430   LYMPHSABS 1.8 03/28/2011 0430   MONOABS 0.9 03/28/2011 0430   EOSABS 0.1 03/28/2011 0430   BASOSABS 0.0 03/28/2011 0430    BMET    Component Value Date/Time   NA 134* 03/30/2011 0455   K 4.1 03/30/2011 0455   CL 97 03/30/2011 0455   CO2 30 03/30/2011 0455   GLUCOSE 98 03/30/2011 0455   BUN 10 03/30/2011 0455   CREATININE 0.22* 03/30/2011 0455   CALCIUM 9.2 03/30/2011 0455   GFRNONAA >90 03/30/2011 0455   GFRAA >90 03/30/2011 0455   Radiology: CXR: improved int changes over days period time, no changes otherwise  Assessment and Plan:  Acute on chronic respiratory failure due to pneumonia in setting of long term tracheostomy.  R/p VAP, pseudomonas PLAN -  -keep bovona trach, NO cuff  down until secretions reduced further Complete 14 days cefipime Maintain trach collar again overnight Monitor secretions to decide when safe to go home May wish lasix in future, pending BP  Pneumonia - r/o VAP PLAN - maintain cefepime x 14 days Afebrile pcxr improved overall Has picc, could send home with picc and abx IF , would NOt chage to oral agent, would complete 14 IV   Cerebral palsy.  History of seizures.  No active seizures. PLAN -  -Continue preadmission medications  GERD, dysphagia, s/p PEG ileus PLAN -  -TFs to bolus feedings tolerating better after treatment pseudp Free water limit when  able  Hypotension R/o hypovolemia Resolved after pos nbalance Keep even May need lasix low dose  1  Updated mom extensive daily,  in room  Best practices / Disposition: -->Full Code -->DVT Px: scd's -->GI Px: protonix -->Diet: PEG feeding  Mcarthur Rossetti. Tyson Alias, MD, FACP Pgr: [phone] [facility]

## 2011-03-30 NOTE — Progress Notes (Signed)
PHARMACY - CRITICAL CARE PROGRESS NOTE  Pharmacy Consult for Cefepime Indication: Pseudomonas VAP   Allergies  Allergen Reactions  . Amoxicillin Other (See Comments)    Yeast Infection   . Morphine And Related Other (See Comments)    Unknown  . Ceftazidime Rash    Patient Measurements: Height: 5\' 5"  (165.1 cm) Weight: 86 lb 6.7 oz (39.2 kg) IBW/kg (Calculated) : 57   Vital Signs: Temp: 98.9 F (37.2 C) (03/13 0800) Temp src: Oral (03/13 0800) BP: 128/69 mmHg (03/13 1100) Pulse Rate: 116  (03/13 1100) Intake/Output from previous day: 03/12 0701 - 03/13 0700 In: 1736.3 [I.V.:245.3; NG/GT:65; IV Piggyback:350] Out: -  Intake/Output from this shift: Total I/O In: 650 [I.V.:60; Other:440; NG/GT:100; IV Piggyback:50] Out: -  Vent settings for last 24 hours: Vent Mode:  [-]  FiO2 (%):  [35 %-40 %] 35 %  Labs:  Basename 03/30/11 0455 03/29/11 0500 03/28/11 0430  WBC -- -- 5.4  HGB -- -- 9.1*  HCT -- -- 29.0*  PLT -- -- 339  APTT -- -- --  INR -- -- --  CREATININE 0.22* 0.23* 0.24*  LABCREA -- -- --  CREATININE 0.22* 0.23* 0.24*  LABCREA -- -- --  CREAT24HRUR -- -- --  MG -- 2.2 --  PHOS -- 4.1 --  ALBUMIN -- -- --  PROT -- -- --  AST -- -- --  ALT -- -- --  ALKPHOS -- -- --  BILITOT -- -- --  BILIDIR -- -- --  IBILI -- -- --   Estimated Creatinine Clearance: 65.4 ml/min (by C-G formula based on Cr of 0.22).  No results found for this basename: GLUCAP:3 in the last 72 hours  Microbiology: Recent Results (from the past 720 hour(s))  CULTURE, BLOOD (ROUTINE X 2)     Status: Normal   Collection Time   03/17/11 10:16 AM      Component Value Range Status Comment   Specimen Description BLOOD FEMORAL ARTERY COLLECTED BY DOCTOR   Final    Special Requests BOTTLES DRAWN AEROBIC AND ANAEROBIC 5CC   Final    Culture  Setup Time [id number]   Final    Culture     Final    Value: DIPHTHEROIDS(CORYNEBACTERIUM SPECIES)     Note: Standardized susceptibility testing  for this organism is not available.     Note: Gram Stain Report Called to,Read Back By and Verified With: HAIRSTON M ON 03/18/11 AT 2040 BY LOY C Performed at [facility]   Report Status 03/20/2011 FINAL   Final   MRSA PCR SCREENING     Status: Abnormal   Collection Time   03/17/11  2:28 PM      Component Value Range Status Comment   MRSA by PCR POSITIVE (*) NEGATIVE  Final   CULTURE, BLOOD (ROUTINE X 2)     Status: Normal   Collection Time   03/17/11  4:00 PM      Component Value Range Status Comment   Specimen Description BLOOD RIGHT HAND   Final    Special Requests BOTTLES DRAWN AEROBIC AND ANAEROBIC 6CC   Final    Culture NO GROWTH 5 DAYS   Final    Report Status 03/22/2011 FINAL   Final   CULTURE, SPUTUM-ASSESSMENT     Status: Normal   Collection Time   03/17/11  7:06 PM      Component Value Range Status Comment   Specimen Description TRACHEAL ASPIRATE   Final    Special  Requests NONE   Final    Sputum evaluation     Final    Value: THIS SPECIMEN IS ACCEPTABLE. RESPIRATORY CULTURE REPORT TO FOLLOW.     Performed at [facility]   Report Status 03/17/2011 FINAL   Final   CULTURE, RESPIRATORY     Status: Normal   Collection Time   03/17/11  7:06 PM      Component Value Range Status Comment   Specimen Description TRACHEAL ASPIRATE   Final    Special Requests NONE   Final    Gram Stain     Final    Value: MODERATE WBC PRESENT,BOTH PMN AND MONONUCLEAR     FEW SQUAMOUS EPITHELIAL CELLS PRESENT     RARE GRAM POSITIVE COCCI IN PAIRS     RARE GRAM POSITIVE RODS   Culture Non-Pathogenic Oropharyngeal-type Flora Isolated.   Final    Report Status 03/20/2011 FINAL   Final   CULTURE, RESPIRATORY     Status: Normal   Collection Time   03/26/11 12:15 PM      Component Value Range Status Comment   Specimen Description TRACHEAL ASPIRATE   Final    Special Requests NONE   Final    Gram Stain     Final    Value: FEW WBC PRESENT, PREDOMINANTLY PMN     FEW SQUAMOUS EPITHELIAL  CELLS PRESENT     FEW GRAM NEGATIVE RODS     FEW GRAM POSITIVE COCCI IN PAIRS     FEW GRAM POSITIVE RODS   Culture MODERATE PSEUDOMONAS AERUGINOSA   Final    Report Status 03/28/2011 FINAL   Final    Organism ID, Bacteria PSEUDOMONAS AERUGINOSA   Final     Medications:  Scheduled:    . acyclovir cream   Topical QID  . antiseptic oral rinse  15 mL Mouth Rinse QID  . baclofen  20 mg Per Tube QID  . carbamazepine  200 mg Oral Daily   And  . carbamazepine  300 mg Oral Custom  . ceFEPime (MAXIPIME) IV  1 g Intravenous Q12H  . chlorhexidine  15 mL Mouth/Throat BID  . feeding supplement  237 mL Per Tube QID  . free water  100 mL Per Tube Q12H  . glycopyrrolate  2.5 mg Per Tube BID  . levalbuterol  6 puff Inhalation Q6H  . pantoprazole sodium  40 mg Per Tube Daily  . polyethylene glycol  17 g Per Tube Daily  . silver sulfADIAZINE  1 application Topical Daily  . Valproic Acid  625 mg Per Tube TID   Infusions:    . sodium chloride Stopped (03/29/11 1400)   PRN: acetaminophen, hydrocortisone cream, levalbuterol, mupirocin ointment, nystatin ointment, sodium chloride, sodium phosphate, sorbitol, zolpidem  Assessment: Admit Complaint: 26 y/o F with PMH of cerebral palsy requiring 24 hour care, epilepsy, reflux esophagitis s/p feeding tube and chronic trach admitted to [facility] on 2/28 with complaints of fever, increased suctioning, increase in O2 requirement and chest congestion. Transferred to [facility] ICU on 3/1. Transferred to SDU on 3/7, but became too unstable>> transferred back to ICU on 3/8.   Anticoagulation: SCDs for VTE px (Hep SQ refused by mom)  Infectious Disease: Cefepime D#5 for Pseudomonas VAP (planned for 14 days) in the setting of worsening resp secretions and CXR. SCr falsely low d/t debilitation (0.22), Vanc trough ~10 on 1250 q8, increased to 1250 q6. Vanc d/ced 3/11. Currently afebrile, WBC WNL. Resp Cx (3/9): Pseudomonas sensitive to cefepime. Resp  Cx(2/28): NPF, Bcx(2/28):  1/2 on 2/28 diphtheroids - likely contaminant. MRSA PCR (+). Flushing noted with 1st dose of Cefepime (see allergies and 3/9 note), resolved and no add s/sx of rxn.  - Aztreonam 3/1 >> 3/4 - Flagyl 3/1 >> 3/4 - Vanc 3/1 >> 3/5, 3/9 >> 3/11  -Cefepime 3/9 >> current  Cardiovascular: BP at goal. Bradycardic episodes on 3/12 - resolved (now occasionally tachycardic). Lasix d/ced 3/11.   Gastrointestinal / Nutrition: Hx reflux esophagitis, has PEG for feeding and meds. On protonix PT, glycopyrrolate for secretions. On miralax daily, sorbitol PRN, and sodium phosphate enema PRN. On Ensure PT. LBM: 3/13.   Neurology: Hx cerebral palsy/epilepsy. On home tegretol, depakote, VPA level 71.1 ok; free carbamaz level therapeutic. No active szs. On baclofen 20mg  PT QID for spasticity. No sedation, GCS 11. RASS 0.   Nephrology: Scr is falsely low d/t debilitation. No UOP recorded d/t pt wearing diapers. On free water PT Q12 (slightly hyponatremic - discussed with Tyson Alias & will continue to monitor). Other lytes WNL.  Pulmonary: Transferred back to the MICU on 3/8 d/t decompensation. CXR (3/8): worsened aeration in R lung. Switch from vent to trach collar 97% at fiO2 35%. On xopenex scheduled, glycopyrrolate for copious secretions.  Hematology / Oncology: Hgb/Hct increasing. Plt WNL.  PTA Meds: Alprazolam PRN not yet resumed.  Best Practice: MC, SCDs, PPI   Plan:  1. Continue cefepime 1g IV Q12 for total regimen of 14 days.  2. Will continue to monitor medication regimen to ensure appropriateness.   Delphia Grates, PharmD Candidate 03/30/2011,11:24 AM  I agree with the above assessment and recommendations. Rolland Porter, Pharm.D., BCPS Clinical Pharmacist Pager: [phone]

## 2011-03-30 NOTE — Progress Notes (Signed)
Small bruise (greenish in color) on left forearm area. Also bruising (greenish in color) on LLQ of stomach. Mother states this was present before patient admitted to 2100.

## 2011-03-30 NOTE — Progress Notes (Signed)
Patient discussed at the Long Length of Stay [name] Weeks 03/30/2011  

## 2011-03-31 DIAGNOSIS — J159 Unspecified bacterial pneumonia: Secondary | ICD-10-CM

## 2011-03-31 LAB — BASIC METABOLIC PANEL
BUN: 9 mg/dL (ref 6–23)
CO2: 29 mEq/L (ref 19–32)
Calcium: 9 mg/dL (ref 8.4–10.5)
Creatinine, Ser: 0.22 mg/dL — ABNORMAL LOW (ref 0.50–1.10)

## 2011-03-31 MED ORDER — SODIUM CHLORIDE 0.9 % IJ SOLN
INTRAMUSCULAR | Status: AC
  Filled 2011-03-31: qty 20

## 2011-03-31 NOTE — Progress Notes (Signed)
Notified by Vidal Schwalbe, Sec Tech that the pt's HR =155 @ 2222. Immediately went to pt's room & found pt in extreme respiratory distress with sats in the 20s, tachypnic, dipjhoretic, and significant incr WOB.  Immediately suctioned pt, called for RT & began hooking up BVM.  Pt responded to suctioning and sats rebounded to 93 almost immediately. Pt's vitals stabilized, breathing eased and color improved.  Jonna Clark, RT also responded & increased pt's O2 back from 30% to 35%. Pt now resting comfortably.  Rawson Minix, Diona Fanti, RN

## 2011-03-31 NOTE — Progress Notes (Signed)
Name: [name] MRN: [id number] DOB: 02-Jan-1986    LOS: 14  PCCM FOLLOW UP NOTE   History of Present Illness:  26 y/o F with PMH of cerebral palsy requiring 24 hour care, epilepsy, reflux esophagitis s/p feeding tube and chronic trach admitted to [facility] on 2/28 with fever, increased resp secretions, increased O2 requirement.  CXR demonstrated R sided infiltrate concerning for PNA.  Patient transferred to [facility] 3/1 for further care.    Lines / Drains: Pediatric cuffless trach (pta)>>>3/1 #4 cuffless 3/1>>>3/2     #6 ETT/trach 3/2 >> 3/3 Bivona trach 3/3 >> RUE PICC 3/3 >>  Cultures: 2/28 MRSA PCR >> POS 2/28 Blood>>> 1/2 Diphtheroids (contaminant) 2/28 Sputum >> normal flora  3/9 sputum>>>moderate pseudomonas>>>res to cipro, otherwise sens  Antibiotics:   2/28 Clinda>>>3/1 3/1  Aztreonam (Pseudomonas)>>> 3/4 3/1  Flagyl (Anaerobes)>>> 3/4 2/28 Vanco >> 3/5 2/28 Levaquin >> 3/10 (stop date ordered) 3/9 vanc>>>3/11 3/9 cefipime>>>(res levo, cipro)>>> plan 14 days  Tests / Events: 3/1  Transferred from [facility] with pneumonia 3/7  transferred to SDU vent bed 3/8- decompensates back to unit and vent support 3/9- remained on trach collar, desats this am  3/10- back to vent 3/11- sputum pseudomonas, likely neg balance 3/11 kub- ileus  3/12- drop in BP, fluids provided 3/13- trach collar done successful over night 3/14 - episode of tachy, desaturations, mucus plugging with increased secretions  Vital Signs: Filed Vitals:   03/31/11 0900 03/31/11 1000 03/31/11 1100 03/31/11 1138  BP: 83/52 121/59 112/65   Pulse: 77 88 97   Temp:    98 F (36.7 C)  TempSrc:    Axillary  Resp: 24 18 28    Height:      Weight:      SpO2: 98% 99% 96%     Intake/Output Summary (Last 24 hours) at 03/31/11 1215 Last data filed at 03/31/11 1100  Gross per 24 hour  Intake   1797 ml  Output      0 ml  Net   1797 ml    Physical Examination: General: chronically ill, cerebral palsy  contractures Neuro: awakens, eyes open  CV: s1s2 tachy at rest (baseline), regular PULM: coarse bilaterally, non-labored, thick white secretions, ATC GI: round / soft, bsx4 active, PEG Extremities: Warm/dry, contracted   Ventilator settings: Vent Mode:  [-]  FiO2 (%):  [28 %-35 %] 35 %  Labs   CBC  Lab 03/31/11 0420 03/30/11 0455 03/29/11 0500 03/28/11 0430 03/27/11 0430 03/26/11 0517  NA 136 134* 137 135 132* --  K 3.7 4.1 -- -- -- --  CL 98 97 98 95* 97 --  CO2 29 30 32 32 29 --  GLUCOSE 122* 98 93 106* 107* --  BUN 9 10 12 10 8  --  CREATININE 0.22* 0.22* 0.23* 0.24* 0.25* --  CALCIUM 9.0 9.2 8.8 8.7 8.6 --  MG -- -- 2.2 -- -- 2.2  PHOS -- -- 4.1 -- -- 4.8*     BMET  Lab 03/31/11 0420 03/30/11 0455 03/29/11 0500 03/28/11 0430 03/27/11 0430 03/26/11 0517  NA 136 134* 137 135 132* --  K 3.7 4.1 -- -- -- --  CL 98 97 98 95* 97 --  CO2 29 30 32 32 29 --  GLUCOSE 122* 98 93 106* 107* --  BUN 9 10 12 10 8  --  CREATININE 0.22* 0.22* 0.23* 0.24* 0.25* --  CALCIUM 9.0 9.2 8.8 8.7 8.6 --  MG -- -- 2.2 -- --  2.2  PHOS -- -- 4.1 -- -- 4.8*    Radiology: CXR: improved int changes over days period time, no changes otherwise  Assessment and Plan:  Acute on chronic respiratory failure due to pneumonia in setting of long term tracheostomy.  R/O VAP, pseudomonas PLAN -  -keep bovona trach, NO cuff down until secretions reduced further -Complete 14 days cefipime -ATC as tolerated -Monitor secretions to decide when safe to go home -May lasix in future, pending BP  Pneumonia - r/o VAP PLAN - maintain cefepime x 14 days -pcxr intermittently -Has picc, could send home with picc and abx IF, would not change to oral agent, would complete 14 IV   Cerebral palsy.  History of seizures.  No active seizures. PLAN -  -Continue preadmission medications  GERD, dysphagia, s/p PEG ileus PLAN -  -TFs to bolus feedings tolerating better after treatment pseudp -Free water limit when  able  Hypotension R/o hypovolemia -->Resolved after pos balance -Keep even   Updated mom extensive daily, in room.    Best practices / Disposition: -->Full Code -->DVT Px: scd's -->GI Px: protonix -->Diet: PEG feeding  Canary Brim, NP-C [facility] Pgr: [phone]  Reviewed above, examined pt and agree with assessment/plan.  Has difficulty with secretions still.  Likely had mucus plug causing oxygen desaturation overnight.  While she is maintaining herself off the ventilator for 48 hrs she still requires extensive nursing and respiratory care.  Don't think she is stable for d/c home until secretion control improves.  Coralyn Helling, MD 03/31/2011, 2:09 PM Pager:  [phone]

## 2011-04-01 ENCOUNTER — Inpatient Hospital Stay (HOSPITAL_COMMUNITY): Payer: Medicare Other

## 2011-04-01 DIAGNOSIS — J9600 Acute respiratory failure, unspecified whether with hypoxia or hypercapnia: Secondary | ICD-10-CM

## 2011-04-01 DIAGNOSIS — J189 Pneumonia, unspecified organism: Secondary | ICD-10-CM

## 2011-04-01 DIAGNOSIS — Z930 Tracheostomy status: Secondary | ICD-10-CM

## 2011-04-01 LAB — CBC
MCH: 26 pg (ref 26.0–34.0)
MCHC: 31.2 g/dL (ref 30.0–36.0)
MCV: 83.2 fL (ref 78.0–100.0)
Platelets: 303 10*3/uL (ref 150–400)
RBC: 3.58 MIL/uL — ABNORMAL LOW (ref 3.87–5.11)
RDW: 13.5 % (ref 11.5–15.5)

## 2011-04-01 LAB — BASIC METABOLIC PANEL
BUN: 7 mg/dL (ref 6–23)
CO2: 29 mEq/L (ref 19–32)
Calcium: 8.9 mg/dL (ref 8.4–10.5)
Creatinine, Ser: <0.2 mg/dL — ABNORMAL LOW (ref 0.50–1.10)
Glucose, Bld: 99 mg/dL (ref 70–99)
Sodium: 136 mEq/L (ref 135–145)

## 2011-04-01 MED ORDER — SODIUM CHLORIDE 0.9 % IJ SOLN
INTRAMUSCULAR | Status: AC
  Administered 2011-04-01: 10 mL
  Filled 2011-04-01: qty 30

## 2011-04-01 MED ORDER — WHITE PETROLATUM GEL
Status: AC
  Administered 2011-04-01: 12:00:00
  Filled 2011-04-01: qty 5

## 2011-04-01 NOTE — Procedures (Signed)
Pts. 6.0 [name] is due to be changed Sunday-04/03/2011

## 2011-04-01 NOTE — Progress Notes (Signed)
Name: [name] MRN: [id number] DOB: 07/18/1985    LOS: 15  PCCM FOLLOW UP NOTE   History of Present Illness:  26 y/o F with PMH of cerebral palsy requiring 24 hour care, epilepsy, reflux esophagitis s/p feeding tube and chronic trach admitted to [facility] on 2/28 with fever, increased resp secretions, increased O2 requirement.  CXR demonstrated R sided infiltrate concerning for PNA.  Patient transferred to [facility] 3/1 for further care.    Lines / Drains: Pediatric cuffless trach (pta)>>>3/1 #4 cuffless 3/1>>>3/2     #6 ETT/trach 3/2 >> 3/3 Bivona trach 3/3 >> RUE PICC 3/3 >>  Cultures: 2/28 MRSA PCR >> POS 2/28 Blood>>> 1/2 Diphtheroids (contaminant) 2/28 Sputum >> normal flora  3/9 sputum>>>moderate pseudomonas>>>res to cipro, otherwise sens  Antibiotics:   2/28 Clinda>>>3/1 3/1  Aztreonam (Pseudomonas)>>> 3/4 3/1  Flagyl (Anaerobes)>>> 3/4 2/28 Vanco >> 3/5 2/28 Levaquin >> 3/10 (stop date ordered) 3/9 vanc>>>3/11 3/9 cefipime>>>(res levo, cipro)>>> plan 14 days  Tests / Events: 3/1  Transferred from [facility] with pneumonia 3/7  transferred to SDU vent bed 3/8- decompensates back to unit and vent support 3/9- remained on trach collar, desats this am  3/10- back to vent 3/11- sputum pseudomonas, likely neg balance 3/11 kub- ileus  3/12- drop in BP, fluids provided 3/13- trach collar done successful over night 3/14 - episode of tachy, desaturations, mucus plugging with increased secretions  Subjective: Still has lots of secretions needing frequent suctioning.  Now having frequent bowel movements.  Vital Signs: Filed Vitals:   04/01/11 0700 04/01/11 0800 04/01/11 0822 04/01/11 0900  BP: 105/62 123/94    Pulse: 69 118  109  Temp:   98.2 F (36.8 C)   TempSrc:   Axillary   Resp: 23     Height:      Weight:      SpO2: 93% 100%  93%    Intake/Output Summary (Last 24 hours) at 04/01/11 0907 Last data filed at 04/01/11 0600  Gross per 24 hour  Intake   1387  ml  Output    100 ml  Net   1287 ml    Physical Examination: General: chronically ill, cerebral palsy contractures Neuro: awakens, eyes open  CV: s1s2 tachy at rest (baseline), regular PULM: coarse bilaterally, non-labored, thick white secretions, ATC GI: round / soft, bsx4 active, PEG Extremities: Warm/dry, contracted   Ventilator settings: Vent Mode:  [-]  FiO2 (%):  [35 %-40 %] 35 %  Labs   CBC  Lab 04/01/11 0450 03/31/11 0420 03/30/11 0455 03/29/11 0500 03/28/11 0430 03/26/11 0517  NA 136 136 134* 137 135 --  K 3.6 3.7 -- -- -- --  CL 99 98 97 98 95* --  CO2 29 29 30  32 32 --  GLUCOSE 99 122* 98 93 106* --  BUN 7 9 10 12 10  --  CREATININE <0.20* 0.22* 0.22* 0.23* 0.24* --  CALCIUM 8.9 9.0 9.2 8.8 8.7 --  MG -- -- -- 2.2 -- 2.2  PHOS -- -- -- 4.1 -- 4.8*     BMET  Lab 04/01/11 0450 03/31/11 0420 03/30/11 0455 03/29/11 0500 03/28/11 0430 03/26/11 0517  NA 136 136 134* 137 135 --  K 3.6 3.7 -- -- -- --  CL 99 98 97 98 95* --  CO2 29 29 30  32 32 --  GLUCOSE 99 122* 98 93 106* --  BUN 7 9 10 12 10  --  CREATININE <0.20* 0.22* 0.22* 0.23* 0.24* --  CALCIUM 8.9 9.0 9.2 8.8 8.7 --  MG -- -- -- 2.2 -- 2.2  PHOS -- -- -- 4.1 -- 4.8*    Dg Chest Port 1 View  04/01/2011  *RADIOLOGY REPORT*  Clinical Data: Follow up of airspace disease.  Check tracheostomy position.  PORTABLE CHEST - 1 VIEW  Comparison: 03/30/2011  Findings: Moderate degradation, secondary to extremely low lung volumes and patient positioning.  Tracheostomy is appropriately positioned.  Right-sided PICC line is likely properly positioned, with tip at low SVC.  Mild cardiomegaly.  Extremely low lung volumes.  Moderate to marked right hemidiaphragm elevation, unchanged.  No definite pleural effusion. No pneumothorax.  Similar bibasilar atelectasis with vascular crowding.  Gas filled colon, including under the right hemidiaphragm.  IMPRESSION: 1. No significant change since the prior exam. 2.  Extremely low lung  volumes with bibasilar atelectasis.  Original Report Authenticated By: Consuello Bossier, M.D.     Assessment and Plan:  Acute on chronic respiratory failure due to pneumonia in setting of long term tracheostomy.  R/O VAP, pseudomonas PLAN -  -keep bovona trach, NO cuff down until secretions reduced further -Complete 14 days cefipime -ATC as tolerated>>vent d/c'ed from room -Monitor secretions to decide when safe to go home  Pneumonia - r/o VAP PLAN - maintain cefepime x 14 days -pcxr intermittently -Has picc, could send home with picc and abx IF, would not change to oral agent, would complete 14 IV   Cerebral palsy.  History of seizures.  No active seizures. PLAN -  -Continue preadmission medications  GERD, dysphagia, s/p PEG ileus PLAN -  -TFs to bolus feedings tolerating better after treatment pseudp -Free water limit when able  Hypotension R/o hypovolemia -->Resolved after pos balance -Keep even  Diarrhea PLAN- -check stool for C diff  Best practices / Disposition: -->Full Code -->DVT Px: scd's -->GI Px: protonix -->Diet: PEG feeding  Coralyn Helling, MD 04/01/2011, 9:07 AM Pager:  [phone]

## 2011-04-01 NOTE — Progress Notes (Signed)
Spent 45 minutes with patient for morning assessment.  Cleaned patient and put her up in her chair.  Patient required Trach suctioning 6 times during this time period.  Copious amount of secretions thick frothy and clear.

## 2011-04-02 LAB — BASIC METABOLIC PANEL
CO2: 31 mEq/L (ref 19–32)
Chloride: 95 mEq/L — ABNORMAL LOW (ref 96–112)
GFR calc non Af Amer: >90 mL/min (ref >90)
Glucose, Bld: 120 mg/dL — ABNORMAL HIGH (ref 70–99)
Potassium: 3.8 mEq/L (ref 3.5–5.1)
Sodium: 133 mEq/L — ABNORMAL LOW (ref 135–145)

## 2011-04-02 NOTE — Progress Notes (Signed)
Name: [name] MRN: [id number] DOB: [birth year]    LOS: 16  PCCM FOLLOW UP NOTE   History of Present Illness:  26 y/o F with PMH of cerebral palsy requiring 24 hour care, epilepsy, reflux esophagitis s/p feeding tube and chronic trach admitted to [facility] on 2/28 with fever, increased resp secretions, increased O2 requirement.  CXR demonstrated R sided infiltrate concerning for PNA.  Patient transferred to [facility] 3/1 for further care.    Lines / Drains: Pediatric cuffless trach (pta)>>>3/1 #4 cuffless 3/1>>>3/2     #6 ETT/trach 3/2 >> 3/3 Bivona trach 3/3 >> RUE PICC 3/3 >>  Cultures: 2/28 MRSA PCR >> POS 2/28 Blood>>> 1/2 Diphtheroids (contaminant) 2/28 Sputum >> normal flora  3/9 sputum>>>moderate pseudomonas>>>res to cipro, otherwise sens to cefepime/ eftaz, imi, zosyn  Antibiotics:   2/28 Clinda>>>3/1 3/1  Aztreonam (Pseudomonas)>>> 3/4 3/1  Flagyl (Anaerobes)>>> 3/4 2/28 Vanco >> 3/5 2/28 Levaquin >> 3/10 (stop date ordered) 3/9 vanc>>>3/11 3/9 cefipime>>>(res levo, cipro)>>> plan 14 days  Tests / Events: 3/1  [facility] with [facility] 3/7  transferred to SDU vent bed 3/8- decompensates back to unit and vent support 3/9- remained on trach collar, desats this am  3/10- back to vent 3/11- sputum pseudomonas, likely neg balance 3/11 kub- ileus  3/12- drop in BP, fluids provided 3/13- trach collar done successful over night 3/14 - episode of tachy, desaturations, mucus plugging with increased secretions  Subjective: Still has lots of secretions needing frequent suctioning - 18 times/ last 24h.  frequent bowel movements.  Vital Signs: Filed Vitals:   04/02/11 0510 04/02/11 0600 04/02/11 0700 04/02/11 0816  BP: 88/46 106/65 111/54   Pulse: 77 90 77   Temp:    99.1 F (37.3 C)  TempSrc:    Oral  Resp: 18 21 23    Height:      Weight:      SpO2: 98% 95% 100%     Intake/Output Summary (Last 24 hours) at 04/02/11 0823 Last data filed at  04/02/11 0700  Gross per 24 hour  Intake   2115 ml  Output      1 ml  Net   2114 ml    Physical Examination: General: chronically ill, cerebral palsy contractures Neuro: awakens, eyes open  CV: s1s2 tachy at rest (baseline), regular PULM: coarse bilaterally, non-labored, thick white secretions, ATC GI: round / soft, bsx4 active, PEG Extremities: Warm/dry, contracted   Ventilator settings: Vent Mode:  [-]  FiO2 (%):  [35 %] 35 %  Labs   CBC CBC    Component Value Date/Time   WBC 4.9 04/01/2011 0450   RBC 3.58* 04/01/2011 0450   HGB 9.3* 04/01/2011 0450   HCT 29.8* 04/01/2011 0450   PLT 303 04/01/2011 0450   MCV 83.2 04/01/2011 0450   MCH 26.0 04/01/2011 0450   MCHC 31.2 04/01/2011 0450   RDW 13.5 04/01/2011 0450   LYMPHSABS 1.8 03/28/2011 0430   MONOABS 0.9 03/28/2011 0430   EOSABS 0.1 03/28/2011 0430   BASOSABS 0.0 03/28/2011 0430      BMET  Lab 04/02/11 0500 04/01/11 0450 03/31/11 0420 03/30/11 0455 03/29/11 0500  NA 133* 136 136 134* 137  K 3.8 3.6 -- -- --  CL 95* 99 98 97 98  CO2 31 29 29 30  32  GLUCOSE 120* 99 122* 98 93  BUN 9 7 9 10 12   CREATININE 0.22* <0.20* 0.22* 0.22* 0.23*  CALCIUM 8.7 8.9 9.0 9.2 8.8  MG -- -- -- --  2.2  PHOS -- -- -- -- 4.1    Dg Chest Port 1 View  04/01/2011  *RADIOLOGY REPORT*  Clinical Data: Follow up of airspace disease.  Check tracheostomy position.  PORTABLE CHEST - 1 VIEW  Comparison: 03/30/2011  Findings: Moderate degradation, secondary to extremely low lung volumes and patient positioning.  Tracheostomy is appropriately positioned.  Right-sided PICC line is likely properly positioned, with tip at low SVC.  Mild cardiomegaly.  Extremely low lung volumes.  Moderate to marked right hemidiaphragm elevation, unchanged.  No definite pleural effusion. No pneumothorax.  Similar bibasilar atelectasis with vascular crowding.  Gas filled colon, including under the right hemidiaphragm.  IMPRESSION: 1. No significant change since the prior exam.  2.  Extremely low lung volumes with bibasilar atelectasis.  Original Report Authenticated By: Consuello Bossier, M.D.     Assessment and Plan:  Acute on chronic respiratory failure due to pneumonia in setting of long term tracheostomy.  R/O VAP, pseudomonas PLAN -  -keep bovona trach, NO cuff down until secretions reduced further -Complete 14 days cefipime -ATC as tolerated>>vent d/c'ed from room -Monitor secretions to decide when safe to go home  Pneumonia - r/o VAP PLAN - maintain cefepime x 14 days -pcxr intermittently -Has picc, could send home with picc and abx IF, would not change to oral agent, would complete 14 IV   Cerebral palsy.  History of seizures.  No active seizures. PLAN -  -Continue preadmission medications  GERD, dysphagia, s/p PEG ileus PLAN -  -TFs to bolus feedings tolerating better after treatment pseudp -Free water limit when able  Hypotension R/o hypovolemia -->Resolved after pos balance -Keep even  Diarrhea PLAN- -check stool for C diff  Best practices / Disposition: -->Full Code -->DVT Px: scd's -->GI Px: protonix -->Diet: PEG feeding  Cyril Mourning MD. FCCP. [place] [facility] Pager [phone] If no response call [phone]    04/02/2011, 8:23 AM

## 2011-04-02 NOTE — Progress Notes (Signed)
Pt suctioned by this RN at 0900, 1000 and 1125 thus far this AM.

## 2011-04-02 NOTE — Progress Notes (Signed)
ANTIBIOTIC CONSULT NOTE - FOLLOW UP  Pharmacy Consult for Cefepime Indication: VAP  Allergies  Allergen Reactions  . Amoxicillin Other (See Comments)    Yeast Infection   . Morphine And Related Other (See Comments)    Unknown  . Ceftazidime Rash    Patient Measurements: Height: 5\' 5"  (165.1 cm) Weight: 88 lb 10 oz (40.2 kg) IBW/kg (Calculated) : 57   Vital Signs: Temp: 99.1 F (37.3 C) (03/16 0816) Temp src: Oral (03/16 0816) BP: 95/45 mmHg (03/16 0800) Pulse Rate: 100  (03/16 1100) Intake/Output from previous day: 03/15 0701 - 03/16 0700 In: 2585 [I.V.:480; IV Piggyback:100] Out: 1 [Urine:1] Intake/Output from this shift: Total I/O In: 647 [I.V.:80; Other:367; NG/GT:150; IV Piggyback:50] Out: 0   Labs:  [facility] 04/02/11 0500 04/01/11 0450 03/31/11 0420  WBC -- 4.9 --  HGB -- 9.3* --  PLT -- 303 --  LABCREA -- -- --  CREATININE 0.22* <0.20* 0.22*   Estimated Creatinine Clearance: 67 ml/min (by C-G formula based on Cr of 0.22). No results found for this basename: VANCOTROUGH:2,VANCOPEAK:2,VANCORANDOM:2,GENTTROUGH:2,GENTPEAK:2,GENTRANDOM:2,TOBRATROUGH:2,TOBRAPEAK:2,TOBRARND:2,AMIKACINPEAK:2,AMIKACINTROU:2,AMIKACIN:2, in the last 72 hours   Microbiology: Recent Results (from the past 720 hour(s))  CULTURE, BLOOD (ROUTINE X 2)     Status: Normal   Collection Time   03/17/11 10:16 AM      Component Value Range Status Comment   Specimen Description BLOOD FEMORAL ARTERY COLLECTED BY DOCTOR   Final    Special Requests BOTTLES DRAWN AEROBIC AND ANAEROBIC 5CC   Final    Culture  Setup Time [id number]   Final    Culture     Final    Value: DIPHTHEROIDS(CORYNEBACTERIUM SPECIES)     Note: Standardized susceptibility testing for this organism is not available.     Note: Gram Stain Report Called to,Read Back By and Verified With: HAIRSTON M ON 03/18/11 AT 2040 BY LOY C Performed at [facility]   Report Status 03/20/2011 FINAL   Final   MRSA PCR SCREENING      Status: Abnormal   Collection Time   03/17/11  2:28 PM      Component Value Range Status Comment   MRSA by PCR POSITIVE (*) NEGATIVE  Final   CULTURE, BLOOD (ROUTINE X 2)     Status: Normal   Collection Time   03/17/11  4:00 PM      Component Value Range Status Comment   Specimen Description BLOOD RIGHT HAND   Final    Special Requests BOTTLES DRAWN AEROBIC AND ANAEROBIC 6CC   Final    Culture NO GROWTH 5 DAYS   Final    Report Status 03/22/2011 FINAL   Final   CULTURE, SPUTUM-ASSESSMENT     Status: Normal   Collection Time   03/17/11  7:06 PM      Component Value Range Status Comment   Specimen Description TRACHEAL ASPIRATE   Final    Special Requests NONE   Final    Sputum evaluation     Final    Value: THIS SPECIMEN IS ACCEPTABLE. RESPIRATORY CULTURE REPORT TO FOLLOW.     Performed at [facility]   Report Status 03/17/2011 FINAL   Final   CULTURE, RESPIRATORY     Status: Normal   Collection Time   03/17/11  7:06 PM      Component Value Range Status Comment   Specimen Description TRACHEAL ASPIRATE   Final    Special Requests NONE   Final    Gram Stain  Final    Value: MODERATE WBC PRESENT,BOTH PMN AND MONONUCLEAR     FEW SQUAMOUS EPITHELIAL CELLS PRESENT     RARE GRAM POSITIVE COCCI IN PAIRS     RARE GRAM POSITIVE RODS   Culture Non-Pathogenic Oropharyngeal-type Flora Isolated.   Final    Report Status 03/20/2011 FINAL   Final   CULTURE, RESPIRATORY     Status: Normal   Collection Time   03/26/11 12:15 PM      Component Value Range Status Comment   Specimen Description TRACHEAL ASPIRATE   Final    Special Requests NONE   Final    Gram Stain     Final    Value: FEW WBC PRESENT, PREDOMINANTLY PMN     FEW SQUAMOUS EPITHELIAL CELLS PRESENT     FEW GRAM NEGATIVE RODS     FEW GRAM POSITIVE COCCI IN PAIRS     FEW GRAM POSITIVE RODS   Culture MODERATE PSEUDOMONAS AERUGINOSA   Final    Report Status 03/28/2011 FINAL   Final    Organism ID, Bacteria PSEUDOMONAS  AERUGINOSA   Final     Anti-infectives     Start     Dose/Rate Route Frequency Ordered Stop   03/27/11 2000   vancomycin (VANCOCIN) 1,250 mg in sodium chloride 0.9 % 250 mL IVPB  Status:  Discontinued        1,250 mg 166.7 mL/hr over 90 Minutes Intravenous Every 6 hours 03/27/11 1613 03/28/11 1404   03/26/11 2200   ceFEPIme (MAXIPIME) 1 g in dextrose 5 % 50 mL IVPB        1 g 100 mL/hr over 30 Minutes Intravenous Every 12 hours 03/26/11 1235     03/26/11 2200   vancomycin (VANCOCIN) 1,250 mg in sodium chloride 0.9 % 250 mL IVPB  Status:  Discontinued        1,250 mg 166.7 mL/hr over 90 Minutes Intravenous Every 8 hours 03/26/11 1303 03/27/11 1613   03/26/11 1245   vancomycin (VANCOCIN) 1,250 mg in sodium chloride 0.9 % 250 mL IVPB        1,250 mg 166.7 mL/hr over 90 Minutes Intravenous NOW 03/26/11 1233 03/26/11 1540   03/26/11 1245   ceFEPIme (MAXIPIME) 1 g in dextrose 5 % 50 mL IVPB        1 g 100 mL/hr over 30 Minutes Intravenous NOW 03/26/11 1236 03/26/11 1359   03/26/11 1234   ceFEPIme (MAXIPIME) 1 g in dextrose 5 % 50 mL IVPB  Status:  Discontinued        1 g 100 mL/hr over 30 Minutes Intravenous Every 12 hours 03/26/11 1234 03/26/11 1236   03/24/11 1400   levofloxacin (LEVAQUIN) tablet 750 mg        750 mg Oral Daily 03/24/11 1145 03/26/11 1010   03/21/11 2200   vancomycin (VANCOCIN) 1,250 mg in sodium chloride 0.9 % 250 mL IVPB  Status:  Discontinued        1,250 mg 166.7 mL/hr over 90 Minutes Intravenous Every 6 hours 03/21/11 1736 03/22/11 1120   03/20/11 0900   vancomycin (VANCOCIN) 1,250 mg in sodium chloride 0.9 % 250 mL IVPB  Status:  Discontinued        1,250 mg 166.7 mL/hr over 90 Minutes Intravenous Every 8 hours 03/20/11 0836 03/21/11 1736   03/19/11 1630   aztreonam (AZACTAM) 2 g in dextrose 5 % 50 mL IVPB  Status:  Discontinued        2 g 100 mL/hr  over 30 Minutes Intravenous 3 times per day 03/19/11 1535 03/21/11 1423   03/18/11 2000   aztreonam  (AZACTAM) 2 g in dextrose 5 % 50 mL IVPB  Status:  Discontinued        2 g 100 mL/hr over 30 Minutes Intravenous 3 times per day 03/18/11 1819 03/19/11 1024   03/18/11 1745   metroNIDAZOLE (FLAGYL) IVPB 500 mg  Status:  Discontinued        500 mg 100 mL/hr over 60 Minutes Intravenous Every 8 hours 03/18/11 1745 03/21/11 1423   03/18/11 1400   Levofloxacin (LEVAQUIN) IVPB 750 mg  Status:  Discontinued        750 mg 100 mL/hr over 90 Minutes Intravenous Every 24 hours 03/17/11 2201 03/24/11 1145   03/18/11 1245   clindamycin (CLEOCIN) IVPB 600 mg  Status:  Discontinued        600 mg 100 mL/hr over 30 Minutes Intravenous 3 times per day 03/18/11 1237 03/18/11 1744   03/18/11 0800   vancomycin (VANCOCIN) 750 mg in sodium chloride 0.9 % 150 mL IVPB  Status:  Discontinued        750 mg 150 mL/hr over 60 Minutes Intravenous Every 8 hours 03/17/11 2211 03/20/11 0833   03/17/11 2300   vancomycin (VANCOCIN) IVPB 1000 mg/200 mL premix        1,000 mg 200 mL/hr over 60 Minutes Intravenous  Once 03/17/11 2211 03/18/11 0005   03/17/11 1400   levofloxacin (LEVAQUIN) IVPB 500 mg  Status:  Discontinued        500 mg 100 mL/hr over 60 Minutes Intravenous Every 24 hours 03/17/11 1346 03/17/11 2201   03/17/11 1345   azithromycin (ZITHROMAX) 500 mg in dextrose 5 % 250 mL IVPB  Status:  Discontinued        500 mg 250 mL/hr over 60 Minutes Intravenous Every 24 hours 03/17/11 1345 03/17/11 2201   03/17/11 1145   moxifloxacin (AVELOX) IVPB 400 mg        400 mg 250 mL/hr over 60 Minutes Intravenous  Once 03/17/11 1142 03/17/11 1257          Assessment: 26 y/o female patient receiving day #8/14 cefepime for pseudomonal pneumonia. Renal fxn stable, remains afebrile. Cefepime dosed appropriately at 1g q12h.    Plan:  Cont cefepime and will follow.  Verlene Mayer, PharmD, BCPS Pager [phone] 04/02/2011,11:58 AM

## 2011-04-03 NOTE — Progress Notes (Signed)
Name: [name] MRN: [id number] DOB: 03-17-1985    LOS: 17  PCCM FOLLOW UP NOTE   History of Present Illness:  26 y/o F with PMH of cerebral palsy requiring 24 hour care, epilepsy, reflux esophagitis s/p feeding tube and chronic trach admitted to [facility] on 2/28 with fever, increased resp secretions, increased O2 requirement.  CXR demonstrated R sided infiltrate concerning for PNA.  Patient transferred to [facility] 3/1 for further care.    Lines / Drains: Pediatric cuffless trach (pta)>>>3/1 #4 cuffless 3/1>>>3/2     #6 ETT/trach 3/2 >> 3/3 Bivona trach 3/3 >> RUE PICC 3/3 >>  Cultures: 2/28 MRSA PCR >> POS 2/28 Blood>>> 1/2 Diphtheroids (contaminant) 2/28 Sputum >> normal flora  3/9 sputum>>>moderate pseudomonas>>>res to cipro, otherwise sens to cefepime/ eftaz, imi, zosyn  Antibiotics:   2/28 Clinda>>>3/1 3/1  Aztreonam (Pseudomonas)>>> 3/4 3/1  Flagyl (Anaerobes)>>> 3/4 2/28 Vanco >> 3/5 2/28 Levaquin >> 3/10 (stop date ordered) 3/9 vanc>>>3/11 3/9 cefipime>>>(res levo, cipro)>>> plan 14 days  Tests / Events: 3/1  [facility] with [facility] 3/7  transferred to SDU vent bed 3/8- decompensates back to unit and vent support 3/9- remained on trach collar, desats this am  3/10- back to vent 3/11- sputum pseudomonas, likely neg balance 3/11 kub- ileus  3/12- drop in BP, fluids provided 3/13- trach collar done successful over night 3/14 - episode of tachy, desaturations, mucus plugging with increased secretions  Subjective: Still has lots of secretions needing frequent suctioning   frequent bowel movements.  Vital Signs: Filed Vitals:   04/03/11 0400 04/03/11 0500 04/03/11 0600 04/03/11 0700  BP: 106/65 97/46 108/51 116/73  Pulse: 91 86 97 82  Temp: 98.2 F (36.8 C)     TempSrc: Oral     Resp: 21 26 25 25   Height:      Weight:  40.7 kg (89 lb 11.6 oz)    SpO2: 97% 98% 97% 95%    Intake/Output Summary (Last 24 hours) at 04/03/11 0735 Last data  filed at 04/03/11 0700  Gross per 24 hour  Intake   2001 ml  Output      0 ml  Net   2001 ml    Physical Examination: General: chronically ill, cerebral palsy contractures Neuro: awakens, eyes open  CV: s1s2 tachy at rest (baseline), regular PULM: coarse bilaterally, non-labored, thick white secretions, ATC GI: round / soft, bsx4 active, PEG Extremities: Warm/dry, contracted   Ventilator settings: Vent Mode:  [-]  FiO2 (%):  [35 %] 35 %  Labs   CBC CBC    Component Value Date/Time   WBC 4.9 04/01/2011 0450   RBC 3.58* 04/01/2011 0450   HGB 9.3* 04/01/2011 0450   HCT 29.8* 04/01/2011 0450   PLT 303 04/01/2011 0450   MCV 83.2 04/01/2011 0450   MCH 26.0 04/01/2011 0450   MCHC 31.2 04/01/2011 0450   RDW 13.5 04/01/2011 0450   LYMPHSABS 1.8 03/28/2011 0430   MONOABS 0.9 03/28/2011 0430   EOSABS 0.1 03/28/2011 0430   BASOSABS 0.0 03/28/2011 0430      BMET  Lab 04/02/11 0500 04/01/11 0450 03/31/11 0420 03/30/11 0455 03/29/11 0500  NA 133* 136 136 134* 137  K 3.8 3.6 -- -- --  CL 95* 99 98 97 98  CO2 31 29 29 30  32  GLUCOSE 120* 99 122* 98 93  BUN 9 7 9 10 12   CREATININE 0.22* <0.20* 0.22* 0.22* 0.23*  CALCIUM 8.7 8.9 9.0 9.2 8.8  MG -- -- -- -- 2.2  PHOS -- -- -- -- 4.1    No results found.   Assessment and Plan:  Acute on chronic respiratory failure due to pneumonia in setting of long term tracheostomy.  R/O VAP, pseudomonas PLAN -  -keep bovona trach, NO cuff down until secretions reduced further -Complete 14 days cefipime -ATC as tolerated>>vent d/c'ed from room -Monitor secretions to decide when safe to go home -Mom insistent that she will take [place] home tomorrow -'this is better than her baseline'  Pneumonia - r/o VAP PLAN - maintain cefepime x 14 days -Has picc, could send home with picc and abx IF, would not change to oral agent, would complete 14 IV   Cerebral palsy.  History of seizures.  No active seizures. PLAN -  -Continue preadmission  medications  GERD, dysphagia, s/p PEG ileus PLAN -  -TFs to bolus feedings tolerating better after treatment pseudp -Free water limit now that Na down  Hypotension R/o hypovolemia -->Resolved after pos balance -Keep even  Diarrhea PLAN- - stool for C diff neg  Best practices / Disposition: -->Full Code -->DVT Px: scd's -->GI Px: protonix -->Diet: PEG feeding Discussed with mom - would not recommend going home when suction requirements are high, dangers of mucus plugging discussed, she is willing to take [place] home against medical advice.  Cyril Mourning MD. Tonny Bollman. [place] [facility] Pager [phone] If no response call [phone]    04/03/2011, 7:35 AM

## 2011-04-04 DIAGNOSIS — J151 Pneumonia due to Pseudomonas: Secondary | ICD-10-CM

## 2011-04-04 DIAGNOSIS — K219 Gastro-esophageal reflux disease without esophagitis: Secondary | ICD-10-CM | POA: Diagnosis present

## 2011-04-04 DIAGNOSIS — R197 Diarrhea, unspecified: Secondary | ICD-10-CM | POA: Diagnosis present

## 2011-04-04 DIAGNOSIS — I959 Hypotension, unspecified: Secondary | ICD-10-CM | POA: Diagnosis present

## 2011-04-04 MED ORDER — DEXTROSE 5 % IV SOLN: 1.0000 g | Freq: Two times a day (BID) | INTRAVENOUS | Status: AC

## 2011-04-04 NOTE — Discharge Summary (Signed)
Physician Discharge Summary  Patient ID: [name] MRN: [id number] DOB/AGE: 04/11/1985 26 y.o.  Admit date: 03/17/2011 Discharge date: 04/04/2011    Discharge Diagnoses:  Principal Problem:  *Acute-on-chronic respiratory failure Active Problems:  Community acquired pneumonia  Cerebral palsy  Epilepsy  GERD (gastroesophageal reflux disease)  Pseudomonas pneumonia  Hypotension  Diarrhea    Brief Summary: [name] is a 26 y/o F with PMH of cerebral palsy requiring 24 hour care, epilepsy, reflux esophagitis s/p feeding tube and chronic trach admitted to [facility] on 2/28 with fever, increased resp secretions, increased O2 requirement. CXR demonstrated R sided infiltrate concerning for PNA. Patient transferred to [facility] 3/1 for further care.    Lines / Drains:  Pediatric cuffless trach (pta)>>>3/1  #4 cuffless 3/1>>>3/2  #6 ETT/trach 3/2 >> 3/3  Bivona trach 3/3 >>  RUE PICC 3/3 >>   Cultures:  2/28 MRSA PCR >> POS  2/28 Blood>>> 1/2 Diphtheroids (contaminant)  2/28 Sputum >> normal flora  3/9 sputum>>>moderate pseudomonas>>>res to cipro, otherwise sens to cefepime/ eftaz, imi, zosyn   Antibiotics:  2/28 Clinda>>>3/1  3/1 Aztreonam (Pseudomonas)>>> 3/4  3/1 Flagyl (Anaerobes)>>> 3/4  2/28 Vanco >> 3/5  2/28 Levaquin >> 3/10 (stop date ordered)  3/9 vanc>>>3/11  3/9 cefipime>>>(res levo, cipro)>>> plan 14 days   Tests / Events:  3/1 Transferred from [facility] with pneumonia  3/7 transferred to SDU vent bed  3/8- decompensates back to unit and vent support  3/9- remained on trach collar, desats this am  3/10- back to vent  3/11- sputum pseudomonas, likely neg balance  3/11 kub- ileus  3/12- drop in BP, fluids provided  3/13- trach collar done successful over night  3/14 - episode of tachy, desaturations, mucus plugging with increased secretions  Acute on chronic respiratory failure due to pneumonia in setting of long term tracheostomy.  pseudomonas PNA. Now  with Bovona trach per ENT after much difficulty ventilating pt with multiple other trachs. COnt to have increased secretions.   PLAN -  -keep bovona trach, NO cuff down until secretions reduced further  -Complete 14 days cefipime  -ATC as tolerated -Monitor secretions to decide when safe to go home -Mom insistent that she will take [place] home today-'this is better than her baseline'  -Will defer to PCP regarding further ENT f/u v back to [facility] trach when secretions more manageable.   Pneumonia - pseudomonas PLAN - maintain cefepime x 14 days  -Has picc, home with picc and abx IF, would not change to oral agent, complete 14 days IV   Cerebral palsy. History of seizures. No active seizures.  PLAN -  -Continue preadmission medications   GERD, dysphagia, s/p PEG  ileus  PLAN -  -tolerating bolus feeds   Hypotension  R/o hypovolemia -->Resolved after pos balance  -Keep even   Diarrhea - improving PLAN-  - stool for C diff neg  Discharge Labs  BMET  Lab 04/02/11 0500 04/01/11 0450 03/31/11 0420 03/30/11 0455 03/29/11 0500  NA 133* 136 136 134* 137  K 3.8 3.6 -- -- --  CL 95* 99 98 97 98  CO2 31 29 29 30  32  GLUCOSE 120* 99 122* 98 93  BUN 9 7 9 10 12   CREATININE 0.22* <0.20* 0.22* 0.22* 0.23*  CALCIUM 8.7 8.9 9.0 9.2 8.8  MG -- -- -- -- 2.2  PHOS -- -- -- -- 4.1    CBC   Lab 04/01/11 0450  HGB 9.3*  HCT 29.8*  WBC 4.9  PLT 303      Follow-up Information    Follow up with LUKING,SCOTT, MD. Schedule an appointment as soon as possible for a visit in 2 weeks.   Contact information:   [address] [place] [address] [phone]            Acsa, Estey  Home Medication Instructions [id number]:[id number]   Printed on:04/04/11 1121  Medication Information                    esomeprazole (NEXIUM) 40 MG capsule Give 40 mg by tube daily before breakfast.           valproate (DEPAKENE) 250 MG/5ML syrup Give 625 mg by tube 3 (three) times  daily.           baclofen (LIORESAL) 10 mg/mL SUSP Give 20 mg by tube 4 (four) times daily.            carbamazepine (TEGRETOL) 100 MG chewable tablet Give 200-300 mg by tube 3 (three) times daily. Takes 200 mg in the morning and 300 mg twice a day.           glycopyrrolate (ROBINUL) 1 MG tablet Give 2.5 mg by tube 2 (two) times daily.           Wheat Dextrin (BENEFIBER PO) Give 15 mLs by tube 4 (four) times daily.            polyethylene glycol (MIRALAX / GLYCOLAX) packet Give 17 g by tube daily.            Melatonin 1 MG TABS Give 1 tablet by tube at bedtime.           fluticasone (FLONASE) 50 MCG/ACT nasal spray Place 2 sprays into the nose daily.           acetaminophen (TYLENOL) 500 MG tablet Give 500 mg by tube every 6 (six) hours as needed. Discomfort/Pain           Sodium Phosphates (FLEET ENEMA RE) Place 1 application rectally daily as needed. Constipation           mupirocin ointment (BACTROBAN) 2 % Apply 1 application topically 2 (two) times daily as needed. Redness of Irritation           Diazepam (DIASTAT PEDIATRIC RE) Place 5 mg rectally as needed. Seizures           nystatin ointment (MYCOSTATIN) Apply 1 application topically 3 (three) times daily as needed. redness           hydrocortisone 2.5 % cream Apply 1 application topically 3 (three) times daily as needed. Redness           ibuprofen (ADVIL,MOTRIN) 100 MG/5ML suspension Give 250 mg by tube every 4 (four) hours as needed. Pain/Fever           sorbitol 70 % solution Give 30 mLs by tube daily as needed. Constipation           guaifenesin (ROBITUSSIN) 100 MG/5ML syrup Give 10-15 mLs by tube 4 (four) times daily as needed. Congestion           ALPRAZolam (XANAX) 0.5 MG tablet Give 0.5 mg by tube at bedtime as needed. Agitation           albuterol (PROVENTIL) (2.5 MG/3ML) 0.083% nebulizer solution Take 2.5 mg by nebulization every 6 (six) hours as needed. Wheezing/Shortness of Breath  zolpidem (AMBIEN) 5 MG tablet Give 5 mg by tube at bedtime as needed. Sleep           silver sulfADIAZINE (SILVADENE) 1 % cream Apply 1 application topically daily. Spot on her toe           dextrose 5 % SOLN 50 mL with ceFEPIme 1 G SOLR 1 g Inject 1 g into the vein every 12 (twelve) hours.             Disposition: Home with home health RN and IV abx, repeatedly discussed with mom - would not recommend going home when suction requirements are high, dangers of mucus plugging discussed, she is willing to take [place] home against medical advice since she is confident that with the support she has at home, she will be able to keep up with her suction requirements. I discussed with Dr [name] from ENT who recommended leaving current bovona trach in for another 2 weeks , then fu with him in the office to change    Time spent on disposition:  Greater than 35 minutes.   SignedDanford Bad, NP 04/04/2011  11:26 AM Pager: [phone]   Jahmir Salo V.

## 2011-04-04 NOTE — Progress Notes (Signed)
Name: [name] MRN: [id number] DOB: [birth year]    LOS: 18  PCCM FOLLOW UP NOTE   History of Present Illness:  26 y/o F with PMH of cerebral palsy requiring 24 hour care, epilepsy, reflux esophagitis s/p feeding tube and chronic trach admitted to [facility] on 2/28 with fever, increased resp secretions, increased O2 requirement.  CXR demonstrated R sided infiltrate concerning for PNA.  Patient transferred to [facility] 3/1 for further care.    Lines / Drains: Pediatric cuffless trach (pta)>>>3/1 #4 cuffless 3/1>>>3/2     #6 ETT/trach 3/2 >> 3/3 [name]) Bivona trach 3/3 >> RUE PICC 3/3 >>  Cultures: 2/28 MRSA PCR >> POS 2/28 Blood>>> 1/2 Diphtheroids (contaminant) 2/28 Sputum >> normal flora  3/9 sputum>>>moderate pseudomonas>>>res to cipro, otherwise sens to cefepime/ eftaz, imi, zosyn  Antibiotics:   2/28 Clinda>>>3/1 3/1  Aztreonam (Pseudomonas)>>> 3/4 3/1  Flagyl (Anaerobes)>>> 3/4 2/28 Vanco >> 3/5 2/28 Levaquin >> 3/10 (stop date ordered) 3/9 vanc>>>3/11 3/9 cefipime>>>(res levo, cipro)>>> plan 14 days  Tests / Events: 3/1  [facility]red from [facility] with [facility] 3/7  transferred to SDU vent bed 3/8- decompensates back to unit and vent support 3/9- remained on trach collar, desats this am  3/10- back to vent 3/11- sputum pseudomonas, likely neg balance 3/11 kub- ileus  3/12- drop in BP, fluids provided 3/13- trach collar done successful over night 3/14 - episode of tachy, desaturations, mucus plugging with increased secretions  Subjective: Still has lots of secretions needing frequent suctioning   frequent bowel movements.  Vital Signs: Filed Vitals:   04/04/11 0800 04/04/11 0801 04/04/11 0900 04/04/11 1000  BP: 108/65  105/82 100/63  Pulse: 85  101 86  Temp:  98.2 F (36.8 C)    TempSrc:  Oral    Resp: 22  28 21   Height:      Weight:      SpO2: 96%  98% 97%    Intake/Output Summary (Last 24 hours) at 04/04/11 1126 Last data filed at 04/04/11 1000  Gross per 24 hour  Intake   1384 ml  Output      0 ml  Net   1384 ml    Physical Examination: General: chronically ill, cerebral palsy contractures Neuro: awakens, eyes open  CV: s1s2 tachy at rest (baseline), regular PULM: coarse bilaterally, non-labored, thick white secretions, ATC GI: round / soft, bsx4 active, PEG Extremities: Warm/dry, contracted   Ventilator settings: Vent Mode:  [-]  FiO2 (%):  [35 %] 35 %  Labs   CBC CBC    Component Value Date/Time   WBC 4.9 04/01/2011 0450   RBC 3.58* 04/01/2011 0450   HGB 9.3* 04/01/2011 0450   HCT 29.8* 04/01/2011 0450   PLT 303 04/01/2011 0450   MCV 83.2 04/01/2011 0450   MCH 26.0 04/01/2011 0450   MCHC 31.2 04/01/2011 0450   RDW 13.5 04/01/2011 0450   LYMPHSABS 1.8 03/28/2011 0430   MONOABS 0.9 03/28/2011 0430   EOSABS 0.1 03/28/2011 0430   BASOSABS 0.0 03/28/2011 0430      BMET  Lab 04/02/11 0500 04/01/11 0450 03/31/11 0420 03/30/11 0455 03/29/11 0500  NA 133* 136 136 134* 137  K 3.8 3.6 -- -- --  CL 95* 99 98 97 98  CO2 31 29 29 30  32  GLUCOSE 120* 99 122* 98 93  BUN 9 7 9 10 12   CREATININE 0.22* <0.20* 0.22* 0.22* 0.23*  CALCIUM 8.7 8.9 9.0 9.2 8.8  MG -- -- -- --  2.2  PHOS -- -- -- -- 4.1    No results found.   Assessment and Plan:  Acute on chronic respiratory failure due to pneumonia in setting of long term tracheostomy.  R/O VAP, pseudomonas PLAN -  -keep bovona trach, Can deflate cuff as tolerated -FU with dr [name] in 2-3 weeks to consider changing out (I discussed with him) -Complete 14 days cefipime at home via PICC  -ATC as tolerated -Mom insistent that she will take [name] home -'this is better than her baseline' -Robinul x 4 more days  Pneumonia - r/o VAP PLAN - maintain cefepime x 14 days -Has picc, could send home with picc and abx IF, would not change to oral agent, would complete 14 IV   Cerebral palsy.  History of seizures.  No active seizures. PLAN -  -Continue preadmission  medications  GERD, dysphagia, s/p PEG ileus PLAN -  -TFs to bolus feedings tolerating better after treatment pseudo -Free water limit now that Na down  Hypotension R/o hypovolemia -->Resolved after pos balance -Keep even  Diarrhea PLAN- - stool for C diff neg  Best practices / Disposition: -->Full Code -->DVT Px: scd's -->GI Px: protonix -->Diet: PEG feeding Discussed with mom - would not recommend going home when suction requirements are high, dangers of mucus plugging discussed, she is willing to take [name] home against medical advice.  Lauren Mourning MD. Tonny Bollman. [place] Pager [phone] If no response call [phone]    04/04/2011, 11:26 AM

## 2011-04-04 NOTE — Progress Notes (Signed)
UR Completed.  Shamica Moree Jane [phone] 04/04/2011  

## 2011-04-04 NOTE — Progress Notes (Signed)
ANTIBIOTIC CONSULT NOTE - FOLLOW UP  Pharmacy Consult for Cefepime Indication: Pseudomonas VAP  Allergies  Allergen Reactions  . Amoxicillin Other (See Comments)    Yeast Infection   . Morphine And Related Other (See Comments)    Unknown  . Ceftazidime Rash    Patient Measurements: Height: 5\' 5"  (165.1 cm) Weight: 91 lb 4.3 oz (41.4 kg) IBW/kg (Calculated) : 57   Vital Signs: Temp: 98.2 F (36.8 C) (03/18 0801) Temp src: Oral (03/18 0801) BP: 104/64 mmHg (03/18 0722) Pulse Rate: 76  (03/18 0722) Intake/Output from previous day: 03/17 0701 - 03/18 0700 In: 1514 [I.V.:250; NG/GT:824; IV Piggyback:100] Out: -  Intake/Output from this shift:    Labs:  Basename 04/02/11 0500  WBC --  HGB --  PLT --  LABCREA --  CREATININE 0.22*   Estimated Creatinine Clearance: 69 ml/min (by C-G formula based on Cr of 0.22). No results found for this basename: VANCOTROUGH:2,VANCOPEAK:2,VANCORANDOM:2,GENTTROUGH:2,GENTPEAK:2,GENTRANDOM:2,TOBRATROUGH:2,TOBRAPEAK:2,TOBRARND:2,AMIKACINPEAK:2,AMIKACINTROU:2,AMIKACIN:2, in the last 72 hours   Microbiology: Recent Results (from the past 720 hour(s))  CULTURE, BLOOD (ROUTINE X 2)     Status: Normal   Collection Time   03/17/11 10:16 AM      Component Value Range Status Comment   Specimen Description BLOOD FEMORAL ARTERY COLLECTED BY DOCTOR   Final    Special Requests BOTTLES DRAWN AEROBIC AND ANAEROBIC 5CC   Final    Culture  Setup Time [id number]   Final    Culture     Final    Value: DIPHTHEROIDS(CORYNEBACTERIUM SPECIES)     Note: Standardized susceptibility testing for this organism is not available.     Note: Gram Stain Report Called to,Read Back By and Verified With: HAIRSTON M ON 03/18/11 AT 2040 BY LOY C Performed at [facility]   Report Status 03/20/2011 FINAL   Final   MRSA PCR SCREENING     Status: Abnormal   Collection Time   03/17/11  2:28 PM      Component Value Range Status Comment   MRSA by PCR POSITIVE (*)  NEGATIVE  Final   CULTURE, BLOOD (ROUTINE X 2)     Status: Normal   Collection Time   03/17/11  4:00 PM      Component Value Range Status Comment   Specimen Description BLOOD RIGHT HAND   Final    Special Requests BOTTLES DRAWN AEROBIC AND ANAEROBIC 6CC   Final    Culture NO GROWTH 5 DAYS   Final    Report Status 03/22/2011 FINAL   Final   CULTURE, SPUTUM-ASSESSMENT     Status: Normal   Collection Time   03/17/11  7:06 PM      Component Value Range Status Comment   Specimen Description TRACHEAL ASPIRATE   Final    Special Requests NONE   Final    Sputum evaluation     Final    Value: THIS SPECIMEN IS ACCEPTABLE. RESPIRATORY CULTURE REPORT TO FOLLOW.     Performed at [facility]   Report Status 03/17/2011 FINAL   Final   CULTURE, RESPIRATORY     Status: Normal   Collection Time   03/17/11  7:06 PM      Component Value Range Status Comment   Specimen Description TRACHEAL ASPIRATE   Final    Special Requests NONE   Final    Gram Stain     Final    Value: MODERATE WBC PRESENT,BOTH PMN AND MONONUCLEAR     FEW SQUAMOUS EPITHELIAL CELLS PRESENT  RARE GRAM POSITIVE COCCI IN PAIRS     RARE GRAM POSITIVE RODS   Culture Non-Pathogenic Oropharyngeal-type Flora Isolated.   Final    Report Status 03/20/2011 FINAL   Final   CULTURE, RESPIRATORY     Status: Normal   Collection Time   03/26/11 12:15 PM      Component Value Range Status Comment   Specimen Description TRACHEAL ASPIRATE   Final    Special Requests NONE   Final    Gram Stain     Final    Value: FEW WBC PRESENT, PREDOMINANTLY PMN     FEW SQUAMOUS EPITHELIAL CELLS PRESENT     FEW GRAM NEGATIVE RODS     FEW GRAM POSITIVE COCCI IN PAIRS     FEW GRAM POSITIVE RODS   Culture MODERATE PSEUDOMONAS AERUGINOSA   Final    Report Status 03/28/2011 FINAL   Final    Organism ID, Bacteria PSEUDOMONAS AERUGINOSA   Final   CLOSTRIDIUM DIFFICILE BY PCR     Status: Normal   Collection Time   04/02/11  9:50 AM      Component Value  Range Status Comment   C difficile by pcr NEGATIVE  NEGATIVE  Final     Anti-infectives     Start     Dose/Rate Route Frequency Ordered Stop   03/27/11 2000   vancomycin (VANCOCIN) 1,250 mg in sodium chloride 0.9 % 250 mL IVPB  Status:  Discontinued        1,250 mg 166.7 mL/hr over 90 Minutes Intravenous Every 6 hours 03/27/11 1613 03/28/11 1404   03/26/11 2200   ceFEPIme (MAXIPIME) 1 g in dextrose 5 % 50 mL IVPB        1 g 100 mL/hr over 30 Minutes Intravenous Every 12 hours 03/26/11 1235     03/26/11 2200   vancomycin (VANCOCIN) 1,250 mg in sodium chloride 0.9 % 250 mL IVPB  Status:  Discontinued        1,250 mg 166.7 mL/hr over 90 Minutes Intravenous Every 8 hours 03/26/11 1303 03/27/11 1613   03/26/11 1245   vancomycin (VANCOCIN) 1,250 mg in sodium chloride 0.9 % 250 mL IVPB        1,250 mg 166.7 mL/hr over 90 Minutes Intravenous NOW 03/26/11 1233 03/26/11 1540   03/26/11 1245   ceFEPIme (MAXIPIME) 1 g in dextrose 5 % 50 mL IVPB        1 g 100 mL/hr over 30 Minutes Intravenous NOW 03/26/11 1236 03/26/11 1359   03/26/11 1234   ceFEPIme (MAXIPIME) 1 g in dextrose 5 % 50 mL IVPB  Status:  Discontinued        1 g 100 mL/hr over 30 Minutes Intravenous Every 12 hours 03/26/11 1234 03/26/11 1236   03/24/11 1400   levofloxacin (LEVAQUIN) tablet 750 mg        750 mg Oral Daily 03/24/11 1145 03/26/11 1010   03/21/11 2200   vancomycin (VANCOCIN) 1,250 mg in sodium chloride 0.9 % 250 mL IVPB  Status:  Discontinued        1,250 mg 166.7 mL/hr over 90 Minutes Intravenous Every 6 hours 03/21/11 1736 03/22/11 1120   03/20/11 0900   vancomycin (VANCOCIN) 1,250 mg in sodium chloride 0.9 % 250 mL IVPB  Status:  Discontinued        1,250 mg 166.7 mL/hr over 90 Minutes Intravenous Every 8 hours 03/20/11 0836 03/21/11 1736   03/19/11 1630   aztreonam (AZACTAM) 2 g in dextrose 5 %  50 mL IVPB  Status:  Discontinued        2 g 100 mL/hr over 30 Minutes Intravenous 3 times per day 03/19/11 1535  03/21/11 1423   03/18/11 2000   aztreonam (AZACTAM) 2 g in dextrose 5 % 50 mL IVPB  Status:  Discontinued        2 g 100 mL/hr over 30 Minutes Intravenous 3 times per day 03/18/11 1819 03/19/11 1024   03/18/11 1745   metroNIDAZOLE (FLAGYL) IVPB 500 mg  Status:  Discontinued        500 mg 100 mL/hr over 60 Minutes Intravenous Every 8 hours 03/18/11 1745 03/21/11 1423   03/18/11 1400   Levofloxacin (LEVAQUIN) IVPB 750 mg  Status:  Discontinued        750 mg 100 mL/hr over 90 Minutes Intravenous Every 24 hours 03/17/11 2201 03/24/11 1145   03/18/11 1245   clindamycin (CLEOCIN) IVPB 600 mg  Status:  Discontinued        600 mg 100 mL/hr over 30 Minutes Intravenous 3 times per day 03/18/11 1237 03/18/11 1744   03/18/11 0800   vancomycin (VANCOCIN) 750 mg in sodium chloride 0.9 % 150 mL IVPB  Status:  Discontinued        750 mg 150 mL/hr over 60 Minutes Intravenous Every 8 hours 03/17/11 2211 03/20/11 0833   03/17/11 2300   vancomycin (VANCOCIN) IVPB 1000 mg/200 mL premix        1,000 mg 200 mL/hr over 60 Minutes Intravenous  Once 03/17/11 2211 03/18/11 0005   03/17/11 1400   levofloxacin (LEVAQUIN) IVPB 500 mg  Status:  Discontinued        500 mg 100 mL/hr over 60 Minutes Intravenous Every 24 hours 03/17/11 1346 03/17/11 2201   03/17/11 1345   azithromycin (ZITHROMAX) 500 mg in dextrose 5 % 250 mL IVPB  Status:  Discontinued        500 mg 250 mL/hr over 60 Minutes Intravenous Every 24 hours 03/17/11 1345 03/17/11 2201   03/17/11 1145   moxifloxacin (AVELOX) IVPB 400 mg        400 mg 250 mL/hr over 60 Minutes Intravenous  Once 03/17/11 1142 03/17/11 1257          Assessment: Admit Complaint: 26 y/o F with PMH of cerebral palsy requiring 24 hour care, epilepsy, reflux esophagitis s/p feeding tube and chronic trach admitted to [facility] on 2/28 with complaints of fever, increased suctioning, increase in O2 requirement and chest congestion. Transferred to [facility] ICU on 3/1. Transferred to SDU  on 3/7, but became too unstable>> transferred back to ICU on 3/8.   Anticoag: SCDs for VTE px (Hep SQ refused by mom)  ID: Cefepime D#10 for Pseudomonas VAP (planned for 14 days) SCr falsely low d/t debilitation. Vanc tr ~10 on 1250 q8->chg q6-> d/ced 3/11. Afebrile, WBC WNL. Resp Cx (3/9): Pseudomonas sens to cefepime. Resp Cx(2/28): NPF, Bcx(2/28): 1/2 on 2/28 diphtheroids - likely contaminant. MRSA PCR (+). Flushing noted with 1st dose of Cefepime (see allergies and 3/9 note), resolved and no add s/sx of rxn.  - Azactam 3/1 >> 3/4 - Flagyl 3/1 >> 3/4 - Vanc 3/1 >> 3/5, 3/9 >> 3/11  -Cefepime 3/9 >>  Cards: BP soft. HR OK-no meds.   GI/ Nutr: Hx reflux esophagitis, has PEG for feeding and meds. On PPI PT, Robinul for secretions. miralax daily, sorbitol PRN, and Fleets PRN. On Ensure PT. Diarrhea this am: C diff PCR neg  Neuro: Hx CP/epilepsy.  On home tegretol, depakote, VPA level 71.1 ok; free carbamaz level ok. No active szs. baclofen 20mg  PT QID for spasticity. No sedation, GCS 9.   Renal: Scr is falsely low d/t debilitation. No UOP recorded d/t pt wearing diapers. On free water PT Q12 (Na 133). Other lytes WNL.  Pulm: Back to MICU on 3/8 d/t decomp. CXR (3/8): worsened aeration in R lung. On trach collar. xopenex scheduled & PRN, Robinul for secretions  Heme/onc: Hgb/Hct increasing. Plt WNL. (No new CBC since 3/15).  PTA Meds: Alprazolam PRN not yet resumed.  Best Practice: MC, SCDs, PPI   Plan:  Continue cefepime 1g IV Q12 for total regimen of 14 days. Will continue to monitor medication regimen to ensure appropriateness. Transfer home today.   Delphia Grates, PharmD Candidate 04/04/2011,9:31 AM  Link Snuffer, PharmD, BCPS Clinical Pharmacist [phone] 04/04/2011, 1:26 PM

## 2011-04-08 NOTE — Progress Notes (Signed)
   CARE MANAGEMENT NOTE 04/08/2011  Patient:  [name], [name]   Account Number:  [id number]  Date Initiated:  03/21/2011  Documentation initiated by:  [facility]  Subjective/Objective Assessment:   Admitted with pneumonia - from home - trach;   24 hour assistance - lives with mother.     Action/Plan:   PTA, PT LIVES WITH MOTHER, HAS NURSING PROVIDED BY [facility]--18HRS/DAY MON-FRI; 14HRS/DAY SAT AND SUN.   Anticipated DC Date:  03/28/2011   Anticipated DC Plan:  HOME W HOME HEALTH SERVICES      DC Planning Services  CM consult      [facility] Choice  HOME HEALTH   Choice offered to / List presented to:          [facility] arranged  HH-1 RN      [facility] agency  [facility]   Status of service:  Completed, signed off Medicare Important Message given?   (If response is "NO", the following Medicare IM given date fields will be blank) Date Medicare IM given:   Date Additional Medicare IM given:    Discharge Disposition:  HOME W HOME HEALTH SERVICES  Per UR Regulation:  Reviewed for med. necessity/level of care/duration of stay  If discussed at Long Length of Stay Meetings, dates discussed:   03/30/2011    Comments:  04/04/11 Marlina Cataldi,RN,BSN 1200 PT FOR DISCHARGE HOME TODAY WITH MOTHER AND [facility] AS PRIOR TO ADMISSION.  PT WILL NEED IV ANTIBIOTICS VIA PICC X 4 DAYS; [facility] TO PROVIDE IV MEDICATIONS FOR PT.  FAXED NEW ORDERS, DISCHARGE SUMMARY TO ANGIE AT [facility]. TRANSPORTATION ARRANGED WITH [facility]. Phone #[phone]  03/25/11 Phillip Maffei,RN,BSN 1200 PT TRANSFERRED TO 2106 AFTER HAVING RESPIRATORY DISTRESS DURING TRANSFER TO CHAIR. Phone #[phone]   03/25/11 Zyaire Mccleod,RN,BSN 1030 ORDER FOR LTAC CONSULT.  MET WITH PT'S MOTHER AND AUNT TO DISCUSS POSS LTAC.  MOM VERY UPSET TODAY AT PT BEING TRANSFERRED TO STEPDOWN UNIT; STATES PT HASN'T BEEN MOVED TO HER CHAIR YET OR HAD HER VIBRAVEST PUT ON.  SHE WANTS PT TO STAY "IN A  [place] AS CLOSE TO HOME AS POSSIBLE" WHILE IN HOSPITAL".  SHE WANTS BOLUS TUBE FEEDING TIMES CHANGED TO 8A, 2P, 8P AND 2A AS AT HOME.  SHE IS UPSET AT THE CONSIDERATION OF LTAC, AND STATES THAT SHE WILL NOT CONSIDER ANY LONG TERM CARE FOR HER DAUGHTER, SHE WILL ONLY CARE FOR HER AT HOME.  DR Molli Knock MET WITH FAMILY, EXPLAINED PT'S CONDITION, REASON FOR LTAC CONSULT.  MOM SEEMS SATISFIED PRESENTLY.  WILL CONT TO FOLLOW PROGRESS. Phone #[phone]   03/22/11 Maks Cavallero,RN,BSN 1230 MET WITH PT'S MOTHER, CSW, AND [facility]ENTAT[facility] X2 TO DISCUSS DC PLAN.  MOTHER PROVIDES CARE WITH ASSISTANCE OF HOME NURSING, PER SCHEDULE ABOVE.  WILL FOLLOW PROGRESSION, FAX DISCHARGE INSTRUCTIONS AND ORDERS TO [facility] WHEN AVAILABLE, ATTN: ANGIE (PT'S NURSE). Phone #[phone]

## 2011-04-11 ENCOUNTER — Telehealth: Payer: Self-pay | Admitting: Pulmonary Disease

## 2011-04-11 DIAGNOSIS — J189 Pneumonia, unspecified organism: Secondary | ICD-10-CM

## 2011-04-11 NOTE — Telephone Encounter (Signed)
Hasn't heard anything yet. Wants to know something

## 2011-04-11 NOTE — Telephone Encounter (Signed)
Dr. Herma Carson at [facility] this afternoon and I called to speak with him regarding pt's PICC line. He said it is okay to send order to D/C PICC line through [facility]. Order sent to [facility].

## 2011-04-11 NOTE — Telephone Encounter (Signed)
Contacted by home care agency nurse Fulton Mole Pia Mau [phone]) requesting d/c PICC line placed last hospital admission. Antibiotics completed.  PCP deferred decision to PCCM who placed line during admission.  Will send d/c order.  Orlean Bradford, M.D., A.C.C.P. Pulmonary and Critical Care Medicine [facility] Cell: [phone] Pager: [phone]

## 2011-04-11 NOTE — Telephone Encounter (Signed)
Dr. Marin Shutter pls advise if OK to D/C PICC line for this patient. Nurse with [facility] is asking for measurements to ensure that they remove all of it as well.

## 2011-04-25 DIAGNOSIS — G809 Cerebral palsy, unspecified: Secondary | ICD-10-CM | POA: Diagnosis not present

## 2011-04-27 DIAGNOSIS — G8389 Other specified paralytic syndromes: Secondary | ICD-10-CM | POA: Diagnosis not present

## 2011-06-24 DIAGNOSIS — G8389 Other specified paralytic syndromes: Secondary | ICD-10-CM | POA: Diagnosis not present

## 2011-07-19 DIAGNOSIS — J189 Pneumonia, unspecified organism: Secondary | ICD-10-CM | POA: Diagnosis not present

## 2011-07-19 DIAGNOSIS — R21 Rash and other nonspecific skin eruption: Secondary | ICD-10-CM | POA: Diagnosis not present

## 2011-07-19 DIAGNOSIS — G8389 Other specified paralytic syndromes: Secondary | ICD-10-CM | POA: Diagnosis not present

## 2011-08-16 DIAGNOSIS — G8389 Other specified paralytic syndromes: Secondary | ICD-10-CM | POA: Diagnosis not present

## 2011-10-13 DIAGNOSIS — G8389 Other specified paralytic syndromes: Secondary | ICD-10-CM | POA: Diagnosis not present

## 2011-12-21 DIAGNOSIS — G8389 Other specified paralytic syndromes: Secondary | ICD-10-CM | POA: Diagnosis not present

## 2012-02-10 DIAGNOSIS — G40909 Epilepsy, unspecified, not intractable, without status epilepticus: Secondary | ICD-10-CM | POA: Diagnosis not present

## 2012-04-07 ENCOUNTER — Other Ambulatory Visit: Payer: Self-pay | Admitting: *Deleted

## 2012-04-07 MED ORDER — FLUTICASONE PROPIONATE 50 MCG/ACT NA SUSP: 2.0000 | Freq: Every day | NASAL | Status: DC

## 2012-04-10 ENCOUNTER — Telehealth: Payer: Self-pay | Admitting: Family Medicine

## 2012-04-10 NOTE — Telephone Encounter (Signed)
i really do not want to give narcotics to her with her severe neuro compromise and multiple admissions to hosp for resp failure

## 2012-04-10 NOTE — Telephone Encounter (Signed)
Nurse: Caller: Sallye Lat nurse for Drake Leach [birth year]b Feb 13, 1985 Pharm: [facility] RX: cold sore / Acyclovir oral suspension 200 mg for 200 liters CB #: [phone] / Alice 8:50am

## 2012-04-10 NOTE — Telephone Encounter (Signed)
Notified [name] that Dr. Lubertha South  really does not want to give narcotics to her with her severe neuro compromise and multiple admissions to hosp for resp failure. [name] verbalized understanding.

## 2012-04-10 NOTE — Telephone Encounter (Signed)
Yes, may refill with prn refills. Let me know if i need to do anything else

## 2012-04-10 NOTE — Telephone Encounter (Signed)
[name] states that patient is experiencing a lot of pain from the cold sores on her mouth. They have given her tylenol, motrin and xanax to help with the pain with no relief. Can you recommend something else to help soothe the pain until the acyclovir liquid kicks in?

## 2012-04-12 ENCOUNTER — Telehealth: Payer: Self-pay | Admitting: Family Medicine

## 2012-04-12 ENCOUNTER — Encounter: Payer: Self-pay | Admitting: Family Medicine

## 2012-04-12 NOTE — Telephone Encounter (Signed)
That is very low grade. No other meds to give for fever. If accompanied by significan t congestion, etc. We could call in abx. ntsw

## 2012-04-12 NOTE — Telephone Encounter (Signed)
Danissa has been running a temp of 98.6 to 99.2 ancillary.  Giving Tylenol and Advil.  Is there something else that can be given?  No respiratory distress.  O2 sats are staying 93 to 94.  Just running a temp.  Fulton Mole is the primary caregiver during the week, she will be leaving today for the weekend and wants to see if something else needs to be done for [facility].  Please call Fulton Mole

## 2012-04-16 ENCOUNTER — Telehealth: Payer: Self-pay | Admitting: Family Medicine

## 2012-04-16 NOTE — Telephone Encounter (Signed)
Hold off on any further meds now

## 2012-04-16 NOTE — Telephone Encounter (Signed)
Pt has had 3 days of the 5 day prescribed dose of acyclovir because the pharmacy had ran out of the medication. She's already gone 4 days without the medication and the pharmacy has offered to compound the medication. She is asking if she should continue with the 2 more days or begin all over.  She is not experiencing any symptoms.

## 2012-04-16 NOTE — Telephone Encounter (Signed)
Patient has missed 4 doses of Acyclovir because the pharmacy did not have enough in stock, but now they are saying they can mix up capsules. What do you suggest she do?

## 2012-04-17 NOTE — Telephone Encounter (Signed)
Has this information been given to the patient?

## 2012-04-19 DIAGNOSIS — G40909 Epilepsy, unspecified, not intractable, without status epilepticus: Secondary | ICD-10-CM | POA: Diagnosis not present

## 2012-04-19 DIAGNOSIS — K210 Gastro-esophageal reflux disease with esophagitis: Secondary | ICD-10-CM

## 2012-04-19 DIAGNOSIS — G8389 Other specified paralytic syndromes: Secondary | ICD-10-CM | POA: Diagnosis not present

## 2012-04-19 NOTE — Telephone Encounter (Signed)
Has this info been given to the patient?

## 2012-04-19 NOTE — Telephone Encounter (Signed)
Spoke with patient's nurse- she states they were informed to hold on further meds.

## 2012-04-27 ENCOUNTER — Telehealth: Payer: Self-pay | Admitting: Family Medicine

## 2012-04-27 NOTE — Telephone Encounter (Signed)
Order faxed to [facility].  Nurse notified.

## 2012-04-27 NOTE — Telephone Encounter (Signed)
Proceed with order

## 2012-04-27 NOTE — Telephone Encounter (Signed)
Ms. Pia Mau states patient current bed is broken.  [facility] needs a prescription for a new bed.  Order must have Patients name, primary diagnosis, reason for bed (none mobile, aspiration, etc.).  Ms. Pia Mau states Medicare and Medicaid needs specific reasons.  States she need bed that move head and feet.  Please call Ms. Bowles to let her know the order has been sent to [facility] as they are hoping to get bed before weekend.

## 2012-05-03 ENCOUNTER — Telehealth: Payer: Self-pay | Admitting: Family Medicine

## 2012-05-03 NOTE — Telephone Encounter (Signed)
[name] from [facility] left me a voice mail that they need a new prescription for the hospital bed that must have the ordering doctors NPI number and an office visit note stating the need for the bed due to needing positioning due to her health conditions that a normal bed can't give like head or feet elevation.

## 2012-05-03 NOTE — Telephone Encounter (Signed)
Ok wis 

## 2012-05-03 NOTE — Telephone Encounter (Signed)
wis 

## 2012-05-03 NOTE — Telephone Encounter (Signed)
Spoke with [facility] @ [facility], said to find out who pt got current bed from and how long she's had it before trying to get pt re-qualified for a new bed.  LMOVM on mom's cell#

## 2012-05-03 NOTE — Telephone Encounter (Signed)
Spoke with patient's mom.  Patient has appt here 4/23, will get order then for hospital bed.  [facility] told mom after they get everything they need it could be 6-8 weeks to get a new bed.  I told mom that I would look into another company.  This is a safety hazard.

## 2012-05-04 ENCOUNTER — Encounter: Payer: Self-pay | Admitting: *Deleted

## 2012-05-09 ENCOUNTER — Ambulatory Visit (INDEPENDENT_AMBULATORY_CARE_PROVIDER_SITE_OTHER): Payer: Medicare Other | Admitting: Family Medicine

## 2012-05-09 ENCOUNTER — Encounter: Payer: Self-pay | Admitting: Family Medicine

## 2012-05-09 VITALS — BP 124/74 | HR 70 | Wt 100.4 lb

## 2012-05-09 DIAGNOSIS — K219 Gastro-esophageal reflux disease without esophagitis: Secondary | ICD-10-CM

## 2012-05-09 DIAGNOSIS — G809 Cerebral palsy, unspecified: Secondary | ICD-10-CM | POA: Diagnosis not present

## 2012-05-09 DIAGNOSIS — G40909 Epilepsy, unspecified, not intractable, without status epilepticus: Secondary | ICD-10-CM | POA: Diagnosis not present

## 2012-05-09 NOTE — Progress Notes (Signed)
  Subjective:    Patient ID: [name], female    DOB: 1985-05-22, 27 y.o.   MRN: [id number]  HPI Patient arrives to the office for a very protracted visit regarding all of her chronic concerns. She has severe cerebral palsy. She has a chronic indwelling trach. Her breathing recently has been stable. She uses nocturnal oxygen and without it develops hypoxia. She has a history of seizure disorder. Fortunately there is been no seizures within the past year. She is on a chronic feeding tube. She has gained approximately 15 pounds in the past year. She has a history of sacral decubiti. None currently. She has history of serious pneumonia in the past some leading to intermittent respiratory failure. Patient has ongoing reflux which is a your control with medications. Patient has a live-in nurse 18 hours per day.   Review of Systems As above. 9 point review systems otherwise negative.    Objective:   Physical Exam  Baseline alertness, awareness uncertain. Hydration decent. Vitals reviewed. HEENT protuberant time. No obvious yeast infection. Good mouth care present. Indwelling trach present. No obvious infection. Lungs Baseline rhonchi. No crackles or wheezes no tachypnea. Heart regular in rhythm. Abdomen soft. Skin no breakdown. Extremities positive contractions secondary to severe cerebral palsy. G-tube present site good. Diffuse spasticity noted.      Assessment & Plan:  Impression #1 profound cerebral palsy. #2 chronic indwelling trach with very fragile pulmonary status stable currently. #3 chronic feeding tube. #4 nutrition apparently adequate. #5 seizure disorder none recently. Plan easily 40 minutes spent most in discussion with mother and nurse along with review of charts. Appropriate blood work today. Hospital bed will be appropriately ordered. Due to aspiration risk patient needs a bed that raises her back 30. With chronic contractures and decubiti tendency patient needs mattress to elevate  legs. WSL

## 2012-05-10 ENCOUNTER — Other Ambulatory Visit: Payer: Self-pay | Admitting: Family Medicine

## 2012-05-15 ENCOUNTER — Telehealth: Payer: Self-pay | Admitting: Family Medicine

## 2012-05-15 NOTE — Telephone Encounter (Signed)
Let's do--my last note included sig info on need for bed

## 2012-05-15 NOTE — Telephone Encounter (Signed)
Per Marylene Land w/[facility]-need new order on Rx for patient's hopsital bed.  Order MUST contain patient's name, DOB, primary dx, reason for bed.  For aspiration prevention to qualify with Medicare, office note MUST state patient needs head of bed to elevate more than 30 degrees. Rx order for bed must be signed and not stamped.  Please forward to me and I will fax in with OV note

## 2012-05-15 NOTE — Telephone Encounter (Signed)
Script wrote and signed by dr. Isidore Moos visit note printed. Forward to [place] to fax

## 2012-05-16 DIAGNOSIS — G40909 Epilepsy, unspecified, not intractable, without status epilepticus: Secondary | ICD-10-CM | POA: Diagnosis not present

## 2012-05-16 DIAGNOSIS — K219 Gastro-esophageal reflux disease without esophagitis: Secondary | ICD-10-CM | POA: Diagnosis not present

## 2012-05-16 DIAGNOSIS — G809 Cerebral palsy, unspecified: Secondary | ICD-10-CM | POA: Diagnosis not present

## 2012-05-16 LAB — CBC WITH DIFFERENTIAL/PLATELET
Basophils Relative: 0 % (ref 0–1)
Eosinophils Absolute: 0.1 10*3/uL (ref 0.0–0.7)
Eosinophils Relative: 1 % (ref 0–5)
Hemoglobin: 13.1 g/dL (ref 12.0–15.0)
MCH: 26.5 pg (ref 26.0–34.0)
MCHC: 32.8 g/dL (ref 30.0–36.0)
Monocytes Absolute: 0.5 10*3/uL (ref 0.1–1.0)
Monocytes Relative: 13 % — ABNORMAL HIGH (ref 3–12)
Neutrophils Relative %: 36 % — ABNORMAL LOW (ref 43–77)

## 2012-05-16 NOTE — Telephone Encounter (Signed)
Faxed Rx for bed and last office note to [facility] @ [facility] Fx# [phone]

## 2012-05-17 LAB — BASIC METABOLIC PANEL
CO2: 25 mEq/L (ref 19–32)
Chloride: 92 mEq/L — ABNORMAL LOW (ref 96–112)
Creat: 0.32 mg/dL — ABNORMAL LOW (ref 0.50–1.10)

## 2012-05-17 LAB — CARBAMAZEPINE LEVEL, TOTAL: Carbamazepine Lvl: 11 ug/mL (ref 4.0–12.0)

## 2012-05-17 LAB — HEPATIC FUNCTION PANEL
Albumin: 3.8 g/dL (ref 3.5–5.2)
Alkaline Phosphatase: 50 U/L (ref 39–117)
Indirect Bilirubin: 0.2 mg/dL (ref 0.0–0.9)
Total Bilirubin: 0.3 mg/dL (ref 0.3–1.2)

## 2012-05-17 LAB — VALPROIC ACID LEVEL: Valproic Acid Lvl: 124.4 ug/mL — ABNORMAL HIGH (ref 50.0–100.0)

## 2012-05-24 ENCOUNTER — Telehealth: Payer: Self-pay | Admitting: Family Medicine

## 2012-05-24 NOTE — Telephone Encounter (Signed)
ok 

## 2012-05-24 NOTE — Telephone Encounter (Signed)
Patients veins are so brittle from last week that they weren't able to get the BW that was ordered and nurse and mom are requesting to have it drawn on May 19th, 2014 instead.

## 2012-06-04 DIAGNOSIS — K219 Gastro-esophageal reflux disease without esophagitis: Secondary | ICD-10-CM | POA: Diagnosis not present

## 2012-06-04 DIAGNOSIS — G40909 Epilepsy, unspecified, not intractable, without status epilepticus: Secondary | ICD-10-CM | POA: Diagnosis not present

## 2012-06-14 DIAGNOSIS — F72 Severe intellectual disabilities: Secondary | ICD-10-CM

## 2012-06-14 DIAGNOSIS — K2100 Gastro-esophageal reflux disease with esophagitis, without bleeding: Secondary | ICD-10-CM

## 2012-06-14 DIAGNOSIS — G809 Cerebral palsy, unspecified: Secondary | ICD-10-CM

## 2012-06-14 DIAGNOSIS — G40909 Epilepsy, unspecified, not intractable, without status epilepticus: Secondary | ICD-10-CM

## 2012-07-24 ENCOUNTER — Other Ambulatory Visit: Payer: Self-pay | Admitting: Family Medicine

## 2012-08-15 DIAGNOSIS — G40909 Epilepsy, unspecified, not intractable, without status epilepticus: Secondary | ICD-10-CM | POA: Diagnosis not present

## 2012-08-15 DIAGNOSIS — G809 Cerebral palsy, unspecified: Secondary | ICD-10-CM

## 2012-08-15 DIAGNOSIS — K210 Gastro-esophageal reflux disease with esophagitis: Secondary | ICD-10-CM

## 2012-09-20 ENCOUNTER — Other Ambulatory Visit: Payer: Self-pay | Admitting: Family Medicine

## 2012-09-20 NOTE — Telephone Encounter (Signed)
Ok plus 5 ref 

## 2012-09-25 ENCOUNTER — Other Ambulatory Visit: Payer: Self-pay | Admitting: *Deleted

## 2012-09-25 ENCOUNTER — Telehealth: Payer: Self-pay | Admitting: Family Medicine

## 2012-09-25 NOTE — Telephone Encounter (Signed)
Let's do 

## 2012-09-25 NOTE — Telephone Encounter (Signed)
Verbal order given to beyada and written order faxed to [facility].

## 2012-09-25 NOTE — Telephone Encounter (Signed)
Pt needs a new order for a 12 French sleeve suction catheter, right now she is on a 10 french but its not suctioning like it needs too and pt is unable to cough it up any more. She will need two orders, one for [facility] who can take a verbal order and one for [facility] please call if you have any questions and need this as soon as possible. They did try the 12 french already and it works well

## 2012-10-05 DIAGNOSIS — K315 Obstruction of duodenum: Secondary | ICD-10-CM | POA: Diagnosis not present

## 2012-10-05 DIAGNOSIS — K210 Gastro-esophageal reflux disease with esophagitis: Secondary | ICD-10-CM

## 2012-10-05 DIAGNOSIS — F72 Severe intellectual disabilities: Secondary | ICD-10-CM | POA: Diagnosis not present

## 2012-10-05 DIAGNOSIS — G40909 Epilepsy, unspecified, not intractable, without status epilepticus: Secondary | ICD-10-CM

## 2012-10-16 ENCOUNTER — Telehealth: Payer: Self-pay | Admitting: Family Medicine

## 2012-10-16 NOTE — Telephone Encounter (Signed)
Refill called into [facility]. Family notified.

## 2012-10-16 NOTE — Telephone Encounter (Signed)
Ok plus two ref 

## 2012-10-16 NOTE — Telephone Encounter (Signed)
Patient needs a refill for Diazepam (DIASTAT PEDIATRIC RE) [place]

## 2012-10-24 ENCOUNTER — Other Ambulatory Visit: Payer: Self-pay | Admitting: Family Medicine

## 2012-10-25 ENCOUNTER — Telehealth: Payer: Self-pay | Admitting: Family Medicine

## 2012-10-25 NOTE — Telephone Encounter (Signed)
Acyclovir 200mg /5mg  sus is on backorder. Ins doesn't pay for cream. Pharm stated insurance would pay for capsules.

## 2012-10-25 NOTE — Telephone Encounter (Signed)
Capsules called into pharmacy at same dosage. Home health nurse was notified.

## 2012-10-25 NOTE — Telephone Encounter (Signed)
Patient has cold sores and current medication will run out and drugstore doesn't have any in stock. She needs new prescription that is compatible to what is she currently on that medicare/medicaid covers. Called into [place].

## 2012-10-25 NOTE — Telephone Encounter (Signed)
What did we rx, wha t do they have?

## 2012-10-25 NOTE — Telephone Encounter (Signed)
How about tabs--capsules not eady to give this pt with feeding tub? If so, rx. If not, rx capsules same dose and tell pt's nurse to administer contents of capsule any way they can

## 2012-10-31 ENCOUNTER — Telehealth: Payer: Self-pay | Admitting: Family Medicine

## 2012-10-31 NOTE — Telephone Encounter (Signed)
error 

## 2012-11-12 ENCOUNTER — Other Ambulatory Visit: Payer: Self-pay | Admitting: Family Medicine

## 2012-11-14 ENCOUNTER — Telehealth: Payer: Self-pay | Admitting: Family Medicine

## 2012-11-14 DIAGNOSIS — Z23 Encounter for immunization: Secondary | ICD-10-CM | POA: Diagnosis not present

## 2012-11-14 NOTE — Telephone Encounter (Signed)
Rx written and sent to [facility] for them to pick up there. Family notified.

## 2012-11-14 NOTE — Telephone Encounter (Signed)
They would like for [place] to pull an flu shot and pneumonia shot (last admin 2006)  for mom to pick up and home RN to administer

## 2012-11-22 ENCOUNTER — Other Ambulatory Visit: Payer: Self-pay

## 2012-12-05 DIAGNOSIS — K315 Obstruction of duodenum: Secondary | ICD-10-CM

## 2012-12-05 DIAGNOSIS — G40909 Epilepsy, unspecified, not intractable, without status epilepticus: Secondary | ICD-10-CM

## 2012-12-05 DIAGNOSIS — K210 Gastro-esophageal reflux disease with esophagitis: Secondary | ICD-10-CM

## 2012-12-05 DIAGNOSIS — F72 Severe intellectual disabilities: Secondary | ICD-10-CM

## 2012-12-05 DIAGNOSIS — G809 Cerebral palsy, unspecified: Secondary | ICD-10-CM | POA: Diagnosis not present

## 2012-12-19 ENCOUNTER — Other Ambulatory Visit: Payer: Self-pay | Admitting: Family Medicine

## 2012-12-31 ENCOUNTER — Other Ambulatory Visit: Payer: Self-pay | Admitting: Family Medicine

## 2013-01-11 DIAGNOSIS — K315 Obstruction of duodenum: Secondary | ICD-10-CM | POA: Diagnosis not present

## 2013-01-11 DIAGNOSIS — K210 Gastro-esophageal reflux disease with esophagitis: Secondary | ICD-10-CM

## 2013-01-11 DIAGNOSIS — F72 Severe intellectual disabilities: Secondary | ICD-10-CM | POA: Diagnosis not present

## 2013-01-11 DIAGNOSIS — G809 Cerebral palsy, unspecified: Secondary | ICD-10-CM | POA: Diagnosis not present

## 2013-01-11 DIAGNOSIS — G40909 Epilepsy, unspecified, not intractable, without status epilepticus: Secondary | ICD-10-CM

## 2013-01-17 ENCOUNTER — Other Ambulatory Visit: Payer: Self-pay | Admitting: Family Medicine

## 2013-02-05 ENCOUNTER — Other Ambulatory Visit: Payer: Self-pay | Admitting: Family Medicine

## 2013-02-08 DIAGNOSIS — G40909 Epilepsy, unspecified, not intractable, without status epilepticus: Secondary | ICD-10-CM

## 2013-02-08 DIAGNOSIS — G809 Cerebral palsy, unspecified: Secondary | ICD-10-CM

## 2013-02-08 DIAGNOSIS — K315 Obstruction of duodenum: Secondary | ICD-10-CM

## 2013-02-08 DIAGNOSIS — F72 Severe intellectual disabilities: Secondary | ICD-10-CM

## 2013-02-08 DIAGNOSIS — K2100 Gastro-esophageal reflux disease with esophagitis, without bleeding: Secondary | ICD-10-CM | POA: Diagnosis not present

## 2013-02-22 ENCOUNTER — Telehealth: Payer: Self-pay | Admitting: Family Medicine

## 2013-02-22 MED ORDER — ZOLPIDEM TARTRATE 5 MG PO TABS: 5.0000 mg | ORAL_TABLET | Freq: Every evening | ORAL | Status: DC | PRN

## 2013-02-22 NOTE — Telephone Encounter (Signed)
Patient notified

## 2013-02-22 NOTE — Telephone Encounter (Signed)
zolpidem (AMBIEN) 5 MG tablet Has only 4 left   Pt needs this refilled, [facility] please  Please call mom when filled

## 2013-02-22 NOTE — Telephone Encounter (Signed)
Ok lets do plus 6 ref

## 2013-03-07 DIAGNOSIS — F72 Severe intellectual disabilities: Secondary | ICD-10-CM | POA: Diagnosis not present

## 2013-03-07 DIAGNOSIS — G809 Cerebral palsy, unspecified: Secondary | ICD-10-CM | POA: Diagnosis not present

## 2013-03-07 DIAGNOSIS — G40909 Epilepsy, unspecified, not intractable, without status epilepticus: Secondary | ICD-10-CM | POA: Diagnosis not present

## 2013-03-07 DIAGNOSIS — K315 Obstruction of duodenum: Secondary | ICD-10-CM | POA: Diagnosis not present

## 2013-03-11 ENCOUNTER — Other Ambulatory Visit: Payer: Self-pay | Admitting: Family Medicine

## 2013-03-18 ENCOUNTER — Other Ambulatory Visit: Payer: Self-pay | Admitting: Family Medicine

## 2013-03-18 NOTE — Telephone Encounter (Signed)
Last seen 05/09/12

## 2013-04-09 DIAGNOSIS — G809 Cerebral palsy, unspecified: Secondary | ICD-10-CM | POA: Diagnosis not present

## 2013-04-09 DIAGNOSIS — K315 Obstruction of duodenum: Secondary | ICD-10-CM

## 2013-04-09 DIAGNOSIS — F72 Severe intellectual disabilities: Secondary | ICD-10-CM | POA: Diagnosis not present

## 2013-04-09 DIAGNOSIS — G40909 Epilepsy, unspecified, not intractable, without status epilepticus: Secondary | ICD-10-CM | POA: Diagnosis not present

## 2013-04-18 ENCOUNTER — Other Ambulatory Visit: Payer: Self-pay | Admitting: Family Medicine

## 2013-04-18 NOTE — Telephone Encounter (Signed)
Ok ref prn 

## 2013-05-09 ENCOUNTER — Encounter: Payer: Medicare Other | Admitting: Family Medicine

## 2013-05-13 ENCOUNTER — Other Ambulatory Visit: Payer: Self-pay | Admitting: Family Medicine

## 2013-05-14 ENCOUNTER — Encounter: Payer: Self-pay | Admitting: Family Medicine

## 2013-05-14 ENCOUNTER — Ambulatory Visit (INDEPENDENT_AMBULATORY_CARE_PROVIDER_SITE_OTHER): Payer: Medicare Other | Admitting: Family Medicine

## 2013-05-14 VITALS — BP 100/62

## 2013-05-14 DIAGNOSIS — Z79899 Other long term (current) drug therapy: Secondary | ICD-10-CM | POA: Diagnosis not present

## 2013-05-14 DIAGNOSIS — R5383 Other fatigue: Secondary | ICD-10-CM

## 2013-05-14 DIAGNOSIS — G809 Cerebral palsy, unspecified: Secondary | ICD-10-CM | POA: Diagnosis not present

## 2013-05-14 DIAGNOSIS — G40909 Epilepsy, unspecified, not intractable, without status epilepticus: Secondary | ICD-10-CM | POA: Diagnosis not present

## 2013-05-14 DIAGNOSIS — R5381 Other malaise: Secondary | ICD-10-CM | POA: Diagnosis not present

## 2013-05-14 DIAGNOSIS — K219 Gastro-esophageal reflux disease without esophagitis: Secondary | ICD-10-CM

## 2013-05-14 NOTE — Progress Notes (Signed)
   Subjective:    Patient ID: [name], female    DOB: 1985-10-10, 28 y.o.   MRN: [id number]  HPI Patient is here today for her annual wellness exam. Patient has not had any recent hospitalizations. Caregiver states that patient has a mole on her face that they would like the doctor to look at. They have no other concerns at this time.   sats a little low at times, allergies acting up at times  No re cent abx  No seizures.  occas swelling in the feet  Rarely swells up, generally before cycle  Recent admissions in the past year for respiratory failure.  No further seizure activity witnessed in the past year. Compliant with all medications.  Allergies acting up more some drainage. Been no particular significant change in color or texture of secretions.  No excess fussiness.  Saw a podiatrist on Silvadene daily to her chronic intertrigo lesion.  No other ulcers or sores elsewhere.  Reflux clinically stable on medications. Review of Systems Review systems difficult secondary to patient's unresponsiveness.    Objective:   Physical Exam  Baseline alertness. Hydration good. Vital stable. Chronic nasal congestion. Chronic respiratory congestion. No true wheezes no tachypnea pharynx chronic changes dentition stable diffuse rigidity heart regular in rhythm abdomen soft foot grade 1 decubitus medial malleolus. Contractures evident.      Assessment & Plan:  Impression 1 cerebral palsy #2 seizure disorder stable. #3 chronic respiratory challenges with reactive airways and secretions clinically stable #4 reflux. #5 nutrition weight holding its own. #6 skin changes discussed plan appropriate blood work. Maintain all medications. Maintain close nursing followup. Recheck as recommended. WSL

## 2013-05-16 DIAGNOSIS — Z79899 Other long term (current) drug therapy: Secondary | ICD-10-CM | POA: Diagnosis not present

## 2013-05-16 DIAGNOSIS — R5383 Other fatigue: Secondary | ICD-10-CM | POA: Diagnosis not present

## 2013-05-16 DIAGNOSIS — R5381 Other malaise: Secondary | ICD-10-CM | POA: Diagnosis not present

## 2013-05-17 LAB — CBC WITH DIFFERENTIAL/PLATELET
Basophils Absolute: 0 10*3/uL (ref 0.0–0.1)
Basophils Relative: 0 % (ref 0–1)
Eosinophils Absolute: 0.1 10*3/uL (ref 0.0–0.7)
Eosinophils Relative: 2 % (ref 0–5)
HCT: 36.6 % (ref 36.0–46.0)
Hemoglobin: 12.3 g/dL (ref 12.0–15.0)
LYMPHS ABS: 1.7 10*3/uL (ref 0.7–4.0)
LYMPHS PCT: 52 % — AB (ref 12–46)
MCH: 26.2 pg (ref 26.0–34.0)
MCHC: 33.6 g/dL (ref 30.0–36.0)
MCV: 78 fL (ref 78.0–100.0)
Monocytes Absolute: 0.4 10*3/uL (ref 0.1–1.0)
Monocytes Relative: 13 % — ABNORMAL HIGH (ref 3–12)
NEUTROS PCT: 33 % — AB (ref 43–77)
Neutro Abs: 1.1 10*3/uL — ABNORMAL LOW (ref 1.7–7.7)
PLATELETS: 105 10*3/uL — AB (ref 150–400)
RBC: 4.69 MIL/uL (ref 3.87–5.11)
RDW: 14 % (ref 11.5–15.5)
WBC: 3.3 10*3/uL — AB (ref 4.0–10.5)

## 2013-05-17 LAB — HEPATIC FUNCTION PANEL
AST: 17 U/L (ref 0–37)
Albumin: 3.8 g/dL (ref 3.5–5.2)
Alkaline Phosphatase: 54 U/L (ref 39–117)
Bilirubin, Direct: 0.1 mg/dL (ref 0.0–0.3)
Indirect Bilirubin: 0.1 mg/dL — ABNORMAL LOW (ref 0.2–1.2)
TOTAL PROTEIN: 6.5 g/dL (ref 6.0–8.3)
Total Bilirubin: 0.2 mg/dL (ref 0.2–1.2)

## 2013-05-17 LAB — VALPROIC ACID LEVEL: VALPROIC ACID LVL: 118 ug/mL — AB (ref 50.0–100.0)

## 2013-05-17 LAB — BASIC METABOLIC PANEL
BUN: 7 mg/dL (ref 6–23)
CO2: 30 mEq/L (ref 19–32)
Calcium: 8.4 mg/dL (ref 8.4–10.5)
Chloride: 89 mEq/L — ABNORMAL LOW (ref 96–112)
Glucose, Bld: 130 mg/dL — ABNORMAL HIGH (ref 70–99)
POTASSIUM: 3.6 meq/L (ref 3.5–5.3)
Sodium: 129 mEq/L — ABNORMAL LOW (ref 135–145)

## 2013-05-17 LAB — CARBAMAZEPINE LEVEL, TOTAL: CARBAMAZEPINE LVL: 9.3 ug/mL (ref 4.0–12.0)

## 2013-06-07 DIAGNOSIS — G40909 Epilepsy, unspecified, not intractable, without status epilepticus: Secondary | ICD-10-CM

## 2013-06-07 DIAGNOSIS — K2100 Gastro-esophageal reflux disease with esophagitis, without bleeding: Secondary | ICD-10-CM

## 2013-06-07 DIAGNOSIS — F72 Severe intellectual disabilities: Secondary | ICD-10-CM | POA: Diagnosis not present

## 2013-06-07 DIAGNOSIS — K315 Obstruction of duodenum: Secondary | ICD-10-CM | POA: Diagnosis not present

## 2013-06-07 DIAGNOSIS — G809 Cerebral palsy, unspecified: Secondary | ICD-10-CM | POA: Diagnosis not present

## 2013-06-11 ENCOUNTER — Telehealth: Payer: Self-pay | Admitting: *Deleted

## 2013-06-11 NOTE — Telephone Encounter (Signed)
Based on that info, would maintain same rx and watch for signs of worseningf and call us if necessary

## 2013-06-11 NOTE — Telephone Encounter (Signed)
FYI: [name] is a trach patient. Over the weekend, she developed a fever ranging fromm 99-100 F axillary. Her sats were 93-94 % on 3 L. Today, her temp is 98.9 axillary, O2 SATS at 96% on 2L, pulse 85, Res 18, BP 102/68. Nurses are doing Nebulizer tx q4h, alternating b/w tylenol and ibu. Secretions have been thin and clear, and lungs have been clear.   Do you want anything else done or sent in for the patient?

## 2013-06-11 NOTE — Telephone Encounter (Signed)
Nurse notified and verbalized understanding.

## 2013-06-12 ENCOUNTER — Other Ambulatory Visit: Payer: Self-pay | Admitting: Family Medicine

## 2013-06-13 ENCOUNTER — Emergency Department (HOSPITAL_COMMUNITY)
Admission: EM | Admit: 2013-06-13 | Discharge: 2013-06-13 | Disposition: A | Discharge status: Home / Self Care | Payer: Medicare Other | Attending: Emergency Medicine | Admitting: Emergency Medicine

## 2013-06-13 ENCOUNTER — Emergency Department (HOSPITAL_COMMUNITY): Payer: Medicare Other

## 2013-06-13 ENCOUNTER — Telehealth: Payer: Self-pay | Admitting: *Deleted

## 2013-06-13 ENCOUNTER — Encounter (HOSPITAL_COMMUNITY): Payer: Self-pay | Admitting: Emergency Medicine

## 2013-06-13 DIAGNOSIS — F72 Severe intellectual disabilities: Secondary | ICD-10-CM | POA: Diagnosis not present

## 2013-06-13 DIAGNOSIS — G40909 Epilepsy, unspecified, not intractable, without status epilepticus: Secondary | ICD-10-CM | POA: Insufficient documentation

## 2013-06-13 DIAGNOSIS — R4182 Altered mental status, unspecified: Secondary | ICD-10-CM | POA: Diagnosis not present

## 2013-06-13 DIAGNOSIS — Z880 Allergy status to penicillin: Secondary | ICD-10-CM | POA: Insufficient documentation

## 2013-06-13 DIAGNOSIS — B349 Viral infection, unspecified: Secondary | ICD-10-CM

## 2013-06-13 DIAGNOSIS — IMO0002 Reserved for concepts with insufficient information to code with codable children: Secondary | ICD-10-CM | POA: Diagnosis not present

## 2013-06-13 DIAGNOSIS — Z79899 Other long term (current) drug therapy: Secondary | ICD-10-CM | POA: Insufficient documentation

## 2013-06-13 DIAGNOSIS — B9789 Other viral agents as the cause of diseases classified elsewhere: Secondary | ICD-10-CM | POA: Insufficient documentation

## 2013-06-13 DIAGNOSIS — M4120 Other idiopathic scoliosis, site unspecified: Secondary | ICD-10-CM | POA: Diagnosis not present

## 2013-06-13 DIAGNOSIS — B338 Other specified viral diseases: Secondary | ICD-10-CM | POA: Diagnosis not present

## 2013-06-13 DIAGNOSIS — Z8719 Personal history of other diseases of the digestive system: Secondary | ICD-10-CM | POA: Insufficient documentation

## 2013-06-13 DIAGNOSIS — J9819 Other pulmonary collapse: Secondary | ICD-10-CM | POA: Diagnosis not present

## 2013-06-13 LAB — BASIC METABOLIC PANEL
BUN: 7 mg/dL (ref 6–23)
CALCIUM: 8.6 mg/dL (ref 8.4–10.5)
CHLORIDE: 85 meq/L — AB (ref 96–112)
CO2: 36 mEq/L — ABNORMAL HIGH (ref 19–32)
Creatinine, Ser: 0.24 mg/dL — ABNORMAL LOW (ref 0.50–1.10)
GFR calc Af Amer: >90 mL/min (ref >90)
Glucose, Bld: 79 mg/dL (ref 70–99)
Potassium: 3.7 mEq/L (ref 3.7–5.3)
Sodium: 130 mEq/L — ABNORMAL LOW (ref 137–147)

## 2013-06-13 LAB — CBC WITH DIFFERENTIAL/PLATELET
BASOS ABS: 0 10*3/uL (ref 0.0–0.1)
Basophils Relative: 0 % (ref 0–1)
EOS ABS: 0.1 10*3/uL (ref 0.0–0.7)
Eosinophils Relative: 1 % (ref 0–5)
HCT: 35 % — ABNORMAL LOW (ref 36.0–46.0)
Hemoglobin: 11 g/dL — ABNORMAL LOW (ref 12.0–15.0)
LYMPHS PCT: 29 % (ref 12–46)
Lymphs Abs: 1.4 10*3/uL (ref 0.7–4.0)
MCH: 25.7 pg — ABNORMAL LOW (ref 26.0–34.0)
MCHC: 31.4 g/dL (ref 30.0–36.0)
MCV: 81.8 fL (ref 78.0–100.0)
MONOS PCT: 18 % — AB (ref 3–12)
Monocytes Absolute: 0.9 10*3/uL (ref 0.1–1.0)
NEUTROS PCT: 52 % (ref 43–77)
Neutro Abs: 2.5 10*3/uL (ref 1.7–7.7)
PLATELETS: 74 10*3/uL — AB (ref 150–400)
RBC: 4.28 MIL/uL (ref 3.87–5.11)
RDW: 12.7 % (ref 11.5–15.5)
WBC MORPHOLOGY: INCREASED
WBC: 4.9 10*3/uL (ref 4.0–10.5)

## 2013-06-13 MED ORDER — CIPROFLOXACIN HCL 250 MG PO TABS
500.0000 mg | ORAL_TABLET | Freq: Once | ORAL | Status: AC
  Administered 2013-06-13: 500 mg via ORAL
  Filled 2013-06-13: qty 2

## 2013-06-13 MED ORDER — SODIUM CHLORIDE 0.9 % IV BOLUS (SEPSIS)
500.0000 mL | Freq: Once | INTRAVENOUS | Status: AC
  Administered 2013-06-13: 500 mL via INTRAVENOUS

## 2013-06-13 MED ORDER — IPRATROPIUM-ALBUTEROL 0.5-2.5 (3) MG/3ML IN SOLN
3.0000 mL | Freq: Once | RESPIRATORY_TRACT | Status: AC
  Administered 2013-06-13: 3 mL via RESPIRATORY_TRACT

## 2013-06-13 MED ORDER — CIPROFLOXACIN HCL 500 MG PO TABS: 500.0000 mg | ORAL_TABLET | Freq: Two times a day (BID) | ORAL | Status: DC

## 2013-06-13 MED ORDER — ALBUTEROL SULFATE (2.5 MG/3ML) 0.083% IN NEBU
INHALATION_SOLUTION | RESPIRATORY_TRACT | Status: AC
  Administered 2013-06-13: 2.5 mg via RESPIRATORY_TRACT
  Filled 2013-06-13: qty 3

## 2013-06-13 MED ORDER — ALBUTEROL SULFATE (2.5 MG/3ML) 0.083% IN NEBU
2.5000 mg | INHALATION_SOLUTION | Freq: Once | RESPIRATORY_TRACT | Status: AC
  Administered 2013-06-13: 2.5 mg via RESPIRATORY_TRACT

## 2013-06-13 MED ORDER — IPRATROPIUM-ALBUTEROL 0.5-2.5 (3) MG/3ML IN SOLN
RESPIRATORY_TRACT | Status: AC
  Administered 2013-06-13: 3 mL via RESPIRATORY_TRACT
  Filled 2013-06-13: qty 3

## 2013-06-13 MED ORDER — METHYLPREDNISOLONE SODIUM SUCC 125 MG IJ SOLR
125.0000 mg | Freq: Once | INTRAMUSCULAR | Status: AC
  Administered 2013-06-13: 125 mg via INTRAVENOUS
  Filled 2013-06-13: qty 2

## 2013-06-13 NOTE — Telephone Encounter (Signed)
FYI: Nurse called and stated patients respiratory situation has worsened today- They have increased her oxygen to 4 units and her sats still will not come above 88-89%. Advised nurse to transport patient to the hospital for evaluation. Nurse verbalized understanding and stated they were transporting now to ER.

## 2013-06-13 NOTE — ED Notes (Signed)
02 sat's 89 - 91. Pt suctioned again. Thin white secretions suctioned

## 2013-06-13 NOTE — ED Notes (Signed)
Pt's nurse states pt's sats have been low for a few days. States she had to be put on 4 liters of 02 and was told by Dr. Gerda Diss to come here for evaluation

## 2013-06-13 NOTE — ED Provider Notes (Signed)
CSN: [id number]     Arrival date & time 06/13/13  1408 History  This chart was scribed for Donnetta Hutching, MD by Dorothey Baseman, ED Scribe. This patient was seen in room APA04/APA04 and the patient's care was started at 2:39 PM.    Chief Complaint  Patient presents with  . Respiratory Distress   The history is provided by a caregiver and a parent (mother and home health nurse).   Level 5 Caveat- Developmental and Intellectual Delay  HPI Comments: YARIELA TISON is a 28 y.o. female with a history of cerebral palsy and severe mental retardation who presents to the Emergency Department complaining of an intermittent fever onset about 5 days ago that her home health nurse states has been well-controled with ibuprofen and Tylenol. She reports that earlier today the patient's pulse oximetry was 88-89%, so she gave the patient 2 breathing treatments at home and placed her on 4L of oxygen without improvement. Patient has allergies to amoxicillin, morphine and related drugs, and ceftazidime. Patient has a trach G-tube and a feeding tube. Patient also has a history of epilepsy and seizures.   Past Medical History  Diagnosis Date  . Cerebral palsy   . Epilepsy   . Ileus   . Reflux esophagitis   . Scoliosis   . Seizures   . Allergy   . Severe mental retardation    Past Surgical History  Procedure Laterality Date  . Gastrostomy w/ feeding tube    . Trach,g-tube     No family history on file. History  Substance Use Topics  . Smoking status: Never Smoker   . Smokeless tobacco: Not on file  . Alcohol Use: No   OB History   Grav Para Term Preterm Abortions TAB SAB Ect Mult Living                 Review of Systems  Unable to perform ROS: Patient nonverbal    A complete 10 system review of systems was obtained and all systems are negative except as noted in the HPI and PMH.    Allergies  Amoxicillin; Morphine and related; and Ceftazidime  Home Medications   Prior to Admission medications    Medication Sig Start Date End Date Taking? Authorizing Provider  albuterol (PROVENTIL) (2.5 MG/3ML) 0.083% nebulizer solution Take 2.5 mg by nebulization every 4 (four) hours as needed for wheezing or shortness of breath.   Yes Historical Provider, MD  ALPRAZolam Prudy Feeler) 0.5 MG tablet Take 0.25-0.5 mg by mouth every 6 (six) hours as needed for anxiety or sleep.   Yes Historical Provider, MD  baclofen (LIORESAL) 10 mg/mL SUSP Give 20 mg by tube 4 (four) times daily.    Yes Historical Provider, MD  carbamazepine (TEGRETOL) 100 MG chewable tablet 200-300 mg by Feeding Tube route 3 (three) times daily. Patient takes 3 tablets in the morning and 2 tablets in the afternoon and 3 tablets at bedtime   Yes Historical Provider, MD  cetirizine HCl (ZYRTEC) 5 MG/5ML SYRP 10 mg by Feeding Tube route daily.   Yes Historical Provider, MD  fluticasone (FLONASE) 50 MCG/ACT nasal spray Place 2 sprays into both nostrils daily.   Yes Historical Provider, MD  glycopyrrolate (ROBINUL) 1 MG tablet Take 2.5 mg by mouth 2 (two) times daily.   Yes Historical Provider, MD  hydrocortisone 2.5 % cream Apply 1 application topically 3 (three) times daily as needed. Redness   Yes Historical Provider, MD  Melatonin 1 MG TABS 1 tablet by  Feeding Tube route at bedtime.    Yes Historical Provider, MD  pantoprazole (PROTONIX) 40 MG tablet 40 mg by Feeding Tube route daily.   Yes Historical Provider, MD  polyethylene glycol (MIRALAX / GLYCOLAX) packet 17 g by Feeding Tube route daily as needed. Constipation   Yes Historical Provider, MD  silver sulfADIAZINE (SILVADENE) 1 % cream Apply 1 application topically daily. Spot on her toe   Yes Historical Provider, MD  sorbitol 70 % solution 30 mLs by Feeding Tube route daily as needed.   Yes Historical Provider, MD  valproate (DEPAKENE) 250 MG/5ML syrup Take 625 mg by mouth 3 (three) times daily. Patient gets 12.73ml three times a day   Yes Historical Provider, MD  Wheat Dextrin (BENEFIBER PO)  15 mLs by Feeding Tube route 4 (four) times daily.    Yes Historical Provider, MD  zolpidem (AMBIEN) 5 MG tablet 5 mg by Feeding Tube route at bedtime as needed for sleep.   Yes Historical Provider, MD  acetaminophen (TYLENOL) 500 MG tablet 500 mg by Feeding Tube route every 6 (six) hours as needed. Discomfort/Pain    Historical Provider, MD  ciprofloxacin (CIPRO) 500 MG tablet Take 1 tablet (500 mg total) by mouth 2 (two) times daily. 06/13/13   Donnetta Hutching, MD  Diazepam (DIASTAT PEDIATRIC RE) Place 5 mg rectally as needed. Seizures    Historical Provider, MD  guaifenesin (ROBITUSSIN) 100 MG/5ML syrup Give 10-15 mLs by tube 4 (four) times daily as needed. Congestion    Historical Provider, MD  ibuprofen (ADVIL,MOTRIN) 100 MG/5ML suspension Give 250 mg by tube every 4 (four) hours as needed. Pain/Fever    Historical Provider, MD  mupirocin ointment (BACTROBAN) 2 % Apply 1 application topically 2 (two) times daily as needed. Redness of Irritation    Historical Provider, MD  Sodium Phosphates (FLEET ENEMA RE) Place 1 application rectally daily as needed. Constipation    Historical Provider, MD   Triage Vitals: BP 99/68  Pulse 100  Temp(Src) 97.5 F (36.4 C) (Axillary)  Resp 24  Wt 102 lb (46.267 kg)  SpO2 96%  LMP 06/11/2013  Physical Exam  Nursing note and vitals reviewed. Constitutional: She is oriented to person, place, and time. She appears well-developed and well-nourished.  HENT:  Head: Normocephalic and atraumatic.  Eyes: Conjunctivae and EOM are normal. Pupils are equal, round, and reactive to light.  Neck: Normal range of motion. Neck supple.  Cardiovascular: Normal rate, regular rhythm and normal heart sounds.   Pulmonary/Chest: Effort normal. She has wheezes.  Bilateral expiratory wheezes.   Abdominal: Soft. Bowel sounds are normal.  Musculoskeletal: Normal range of motion.  Neurological: She is alert and oriented to person, place, and time.  Skin: Skin is warm and dry.   Psychiatric: She has a normal mood and affect. Her behavior is normal.    ED Course  Procedures (including critical care time)  DIAGNOSTIC STUDIES: Oxygen Saturation is 96% on trach G-tube, normal by my interpretation.    COORDINATION OF CARE: 2:46 PM- Discussed treatment plan with patient and parent at bedside and parent verbalized agreement on the patient's behalf.     Labs Review Labs Reviewed  BASIC METABOLIC PANEL - Abnormal; Notable for the following:    Sodium 130 (*)    Chloride 85 (*)    CO2 36 (*)    Creatinine, Ser 0.24 (*)    All other components within normal limits  CBC WITH DIFFERENTIAL - Abnormal; Notable for the following:    Hemoglobin  11.0 (*)    HCT 35.0 (*)    MCH 25.7 (*)    Platelets 74 (*)    Monocytes Relative 18 (*)    All other components within normal limits    Imaging Review Dg Chest Portable 1 View  06/13/2013   CLINICAL DATA:  Respiratory distress  EXAM: PORTABLE CHEST - 1 VIEW  COMPARISON:  04/01/2011  FINDINGS: Cardiomediastinal silhouette is stable. Significant elevation of the right hemidiaphragm again noted. Stable bilateral basilar atelectasis. Tracheostomy tube is unchanged in position. No convincing pulmonary edema.  IMPRESSION: Significant elevation of the right hemidiaphragm again noted. Stable bilateral basilar atelectasis. Tracheostomy tube is unchanged in position. No convincing pulmonary edema.   Electronically Signed   By: Natasha MeadLiviu  Pop M.D.   On: 06/13/2013 14:43     EKG Interpretation None      MDM   Final diagnoses:  Viral illness    Patient has profound cerebral palsy. She tends to get upper respiratory infections with associated hypoxemia. She is suctioned every 20-30 minutes. Chest x-ray shows no pneumonia. Albuterol/Atrovent breathing treatment. IV steroids. Both mother and caregiver report that she is back to baseline and comfortable taking her home.Maryclare Labrador. We'll start an Cipro secondary to her immunocompromised state.  I  personally performed the services described in this documentation, which was scribed in my presence. The recorded information has been reviewed and is accurate.    Donnetta HutchingBrian Eisa Necaise, MD 06/14/13 [id number]

## 2013-06-13 NOTE — Discharge Instructions (Signed)
Chest x-ray shows no pneumonia. Antibiotic twice a day for 10 days. Followup your primary care Dr.   Suction frequently.  Can use your nebulizer machine every 3 hours

## 2013-06-13 NOTE — ED Notes (Signed)
Nurse states pt has had two breathing treatments today and has had to be suctioned several times

## 2013-06-13 NOTE — Progress Notes (Signed)
Pt has a 5.5 pediatric shiley trach.  Pt suctioned with a 12 FR suction cath.  Moderate amount clear/white secretions.  Pt tolerated well.  RT will continue to monitor.

## 2013-06-13 NOTE — Telephone Encounter (Signed)
ok 

## 2013-08-06 DIAGNOSIS — F72 Severe intellectual disabilities: Secondary | ICD-10-CM

## 2013-08-06 DIAGNOSIS — G40909 Epilepsy, unspecified, not intractable, without status epilepticus: Secondary | ICD-10-CM

## 2013-08-06 DIAGNOSIS — G809 Cerebral palsy, unspecified: Secondary | ICD-10-CM | POA: Diagnosis not present

## 2013-08-06 DIAGNOSIS — K315 Obstruction of duodenum: Secondary | ICD-10-CM | POA: Diagnosis not present

## 2013-08-15 ENCOUNTER — Other Ambulatory Visit: Payer: Self-pay | Admitting: Family Medicine

## 2013-09-12 ENCOUNTER — Other Ambulatory Visit: Payer: Self-pay | Admitting: Family Medicine

## 2013-10-03 DIAGNOSIS — G40909 Epilepsy, unspecified, not intractable, without status epilepticus: Secondary | ICD-10-CM

## 2013-10-03 DIAGNOSIS — K315 Obstruction of duodenum: Secondary | ICD-10-CM

## 2013-10-03 DIAGNOSIS — Z930 Tracheostomy status: Secondary | ICD-10-CM | POA: Diagnosis not present

## 2013-10-03 DIAGNOSIS — G809 Cerebral palsy, unspecified: Secondary | ICD-10-CM | POA: Diagnosis not present

## 2013-10-14 ENCOUNTER — Telehealth: Payer: Self-pay | Admitting: Family Medicine
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
## 2013-10-22 NOTE — Telephone Encounter (Signed)
Left a mssg for mom to pick up hard copy

## 2013-11-05 ENCOUNTER — Telehealth: Payer: Self-pay | Admitting: Family Medicine

## 2013-11-05 ENCOUNTER — Other Ambulatory Visit: Payer: Self-pay | Admitting: Family Medicine

## 2013-11-05 DIAGNOSIS — Z23 Encounter for immunization: Secondary | ICD-10-CM | POA: Diagnosis not present

## 2013-11-05 NOTE — Telephone Encounter (Signed)
Rx called into [facility]. Mother notified.

## 2013-11-05 NOTE — Telephone Encounter (Signed)
lets 

## 2013-11-05 NOTE — Telephone Encounter (Signed)
Pt needs flu shot called into [facility] for her to be given at home

## 2013-11-11 ENCOUNTER — Other Ambulatory Visit: Payer: Self-pay | Admitting: Family Medicine

## 2013-11-13 ENCOUNTER — Telehealth: Payer: Self-pay | Admitting: *Deleted

## 2013-11-13 NOTE — Telephone Encounter (Signed)
At home nurse calling to say that she gave the flu vaccine to the patient on Monday. Today she has a low grade fever, the site is red, swollen, and warm to the touch.   I spoke with Dr. Brett CanalesSteve, and he said that is a common reaction. It is her immune system responding to the flu. He said to apply cool, moist compresses and give Tylenol or Motrin if pt is discomfort.   Home nurse verbalized understanding.

## 2013-11-25 ENCOUNTER — Other Ambulatory Visit: Payer: Self-pay | Admitting: Family Medicine

## 2013-11-26 NOTE — Telephone Encounter (Signed)
Ok plus 5 refp 

## 2013-12-10 DIAGNOSIS — K315 Obstruction of duodenum: Secondary | ICD-10-CM | POA: Diagnosis not present

## 2013-12-10 DIAGNOSIS — G809 Cerebral palsy, unspecified: Secondary | ICD-10-CM | POA: Diagnosis not present

## 2013-12-10 DIAGNOSIS — G40901 Epilepsy, unspecified, not intractable, with status epilepticus: Secondary | ICD-10-CM | POA: Diagnosis not present

## 2013-12-10 DIAGNOSIS — F72 Severe intellectual disabilities: Secondary | ICD-10-CM | POA: Diagnosis not present

## 2013-12-10 DIAGNOSIS — Z930 Tracheostomy status: Secondary | ICD-10-CM

## 2013-12-11 ENCOUNTER — Other Ambulatory Visit: Payer: Self-pay | Admitting: Family Medicine

## 2013-12-18 DIAGNOSIS — Z930 Tracheostomy status: Secondary | ICD-10-CM | POA: Diagnosis not present

## 2013-12-18 DIAGNOSIS — G40901 Epilepsy, unspecified, not intractable, with status epilepticus: Secondary | ICD-10-CM | POA: Diagnosis not present

## 2013-12-18 DIAGNOSIS — G809 Cerebral palsy, unspecified: Secondary | ICD-10-CM | POA: Diagnosis not present

## 2013-12-18 DIAGNOSIS — K315 Obstruction of duodenum: Secondary | ICD-10-CM

## 2013-12-18 DIAGNOSIS — F72 Severe intellectual disabilities: Secondary | ICD-10-CM | POA: Diagnosis not present

## 2013-12-24 DIAGNOSIS — Z930 Tracheostomy status: Secondary | ICD-10-CM | POA: Diagnosis not present

## 2013-12-24 DIAGNOSIS — G40901 Epilepsy, unspecified, not intractable, with status epilepticus: Secondary | ICD-10-CM | POA: Diagnosis not present

## 2013-12-24 DIAGNOSIS — G809 Cerebral palsy, unspecified: Secondary | ICD-10-CM | POA: Diagnosis not present

## 2013-12-24 DIAGNOSIS — F72 Severe intellectual disabilities: Secondary | ICD-10-CM

## 2013-12-24 DIAGNOSIS — K315 Obstruction of duodenum: Secondary | ICD-10-CM | POA: Diagnosis not present

## 2014-01-03 ENCOUNTER — Other Ambulatory Visit: Payer: Self-pay | Admitting: Family Medicine

## 2014-01-09 ENCOUNTER — Telehealth: Payer: Self-pay

## 2014-01-09 ENCOUNTER — Other Ambulatory Visit: Payer: Self-pay | Admitting: *Deleted

## 2014-01-09 MED ORDER — CIPROFLOXACIN HCL 500 MG PO TABS: 500.0000 mg | ORAL_TABLET | Freq: Two times a day (BID) | ORAL | Status: DC

## 2014-01-09 NOTE — Telephone Encounter (Signed)
Pt's mother came in wanting to know if an antibiotic can be called in. Mother states that the pt is having a lot of congestion, and is getting  Nebulizer treatments every five hours and is having trouble breathing. Pt is on 2 liters of o2 and pt's mother states that she has been unable to Take the pt off of the o2 because she desats to the 80's.  [facility]

## 2014-01-09 NOTE — Telephone Encounter (Signed)
Spoke to mom and Dr. Brett CanalesSteve. Sent in Cipro per. Call back if s/s persist or worsen. Mom verbalized understanding.

## 2014-01-13 ENCOUNTER — Other Ambulatory Visit: Payer: Self-pay | Admitting: Family Medicine

## 2014-02-04 ENCOUNTER — Telehealth: Payer: Self-pay | Admitting: *Deleted

## 2014-02-04 NOTE — Telephone Encounter (Signed)
Script faxed to [facility]. Alice notified.
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
## 2014-02-10 DIAGNOSIS — J9610 Chronic respiratory failure, unspecified whether with hypoxia or hypercapnia: Secondary | ICD-10-CM | POA: Diagnosis not present

## 2014-02-10 DIAGNOSIS — G40901 Epilepsy, unspecified, not intractable, with status epilepticus: Secondary | ICD-10-CM

## 2014-02-10 DIAGNOSIS — F72 Severe intellectual disabilities: Secondary | ICD-10-CM

## 2014-02-10 DIAGNOSIS — G809 Cerebral palsy, unspecified: Secondary | ICD-10-CM

## 2014-02-10 DIAGNOSIS — K315 Obstruction of duodenum: Secondary | ICD-10-CM | POA: Diagnosis not present

## 2014-02-16 IMAGING — CR DG CHEST 1V PORT
1 series · 1 of 1 positions shown · non-contrast
Comparison: August 26, 2008

CLINICAL DATA: Congestion, shortness of breath

PORTABLE CHEST - 1 VIEW

[view not recorded]
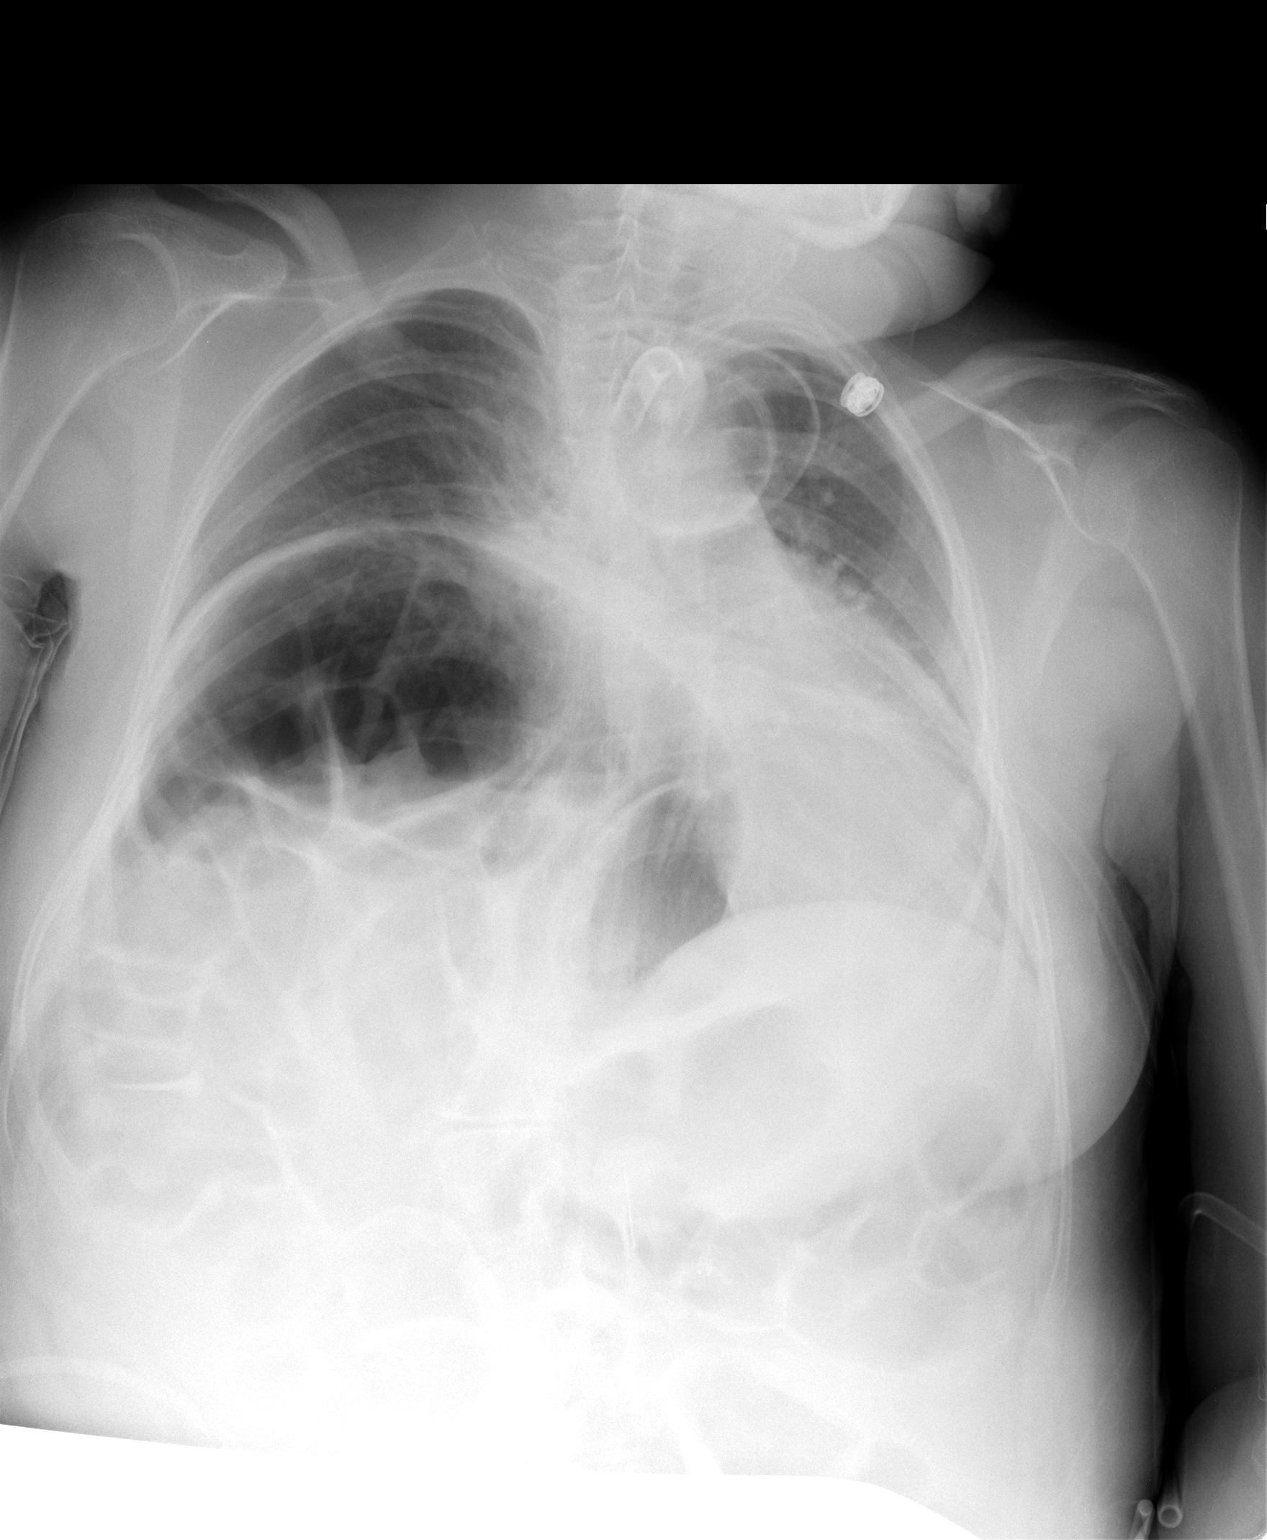

[1 of 1 positions shown; findings below may reference images not displayed]

FINDINGS: Tracheostomy remains in place.  Mild cardiomegaly appears
unchanged.  Right suprahilar opacity/atelectasis does not appear
changed.  Marked elevation of the right hemi diaphragm remains
present with gaseous distention of the colon at the hepatic
flexure. No pleural effusions.
IMPRESSION: Mild right suprahilar atelectasis/infiltrate.

## 2014-02-22 IMAGING — CR DG CHEST 1V PORT
1 series · 1 of 1 positions shown · non-contrast
Comparison: Portable exam 0530 hours compared to 03/21/2011

CLINICAL DATA: Respiratory failure, cerebral palsy, tracheostomy
tube

PORTABLE CHEST - 1 VIEW

[AP]
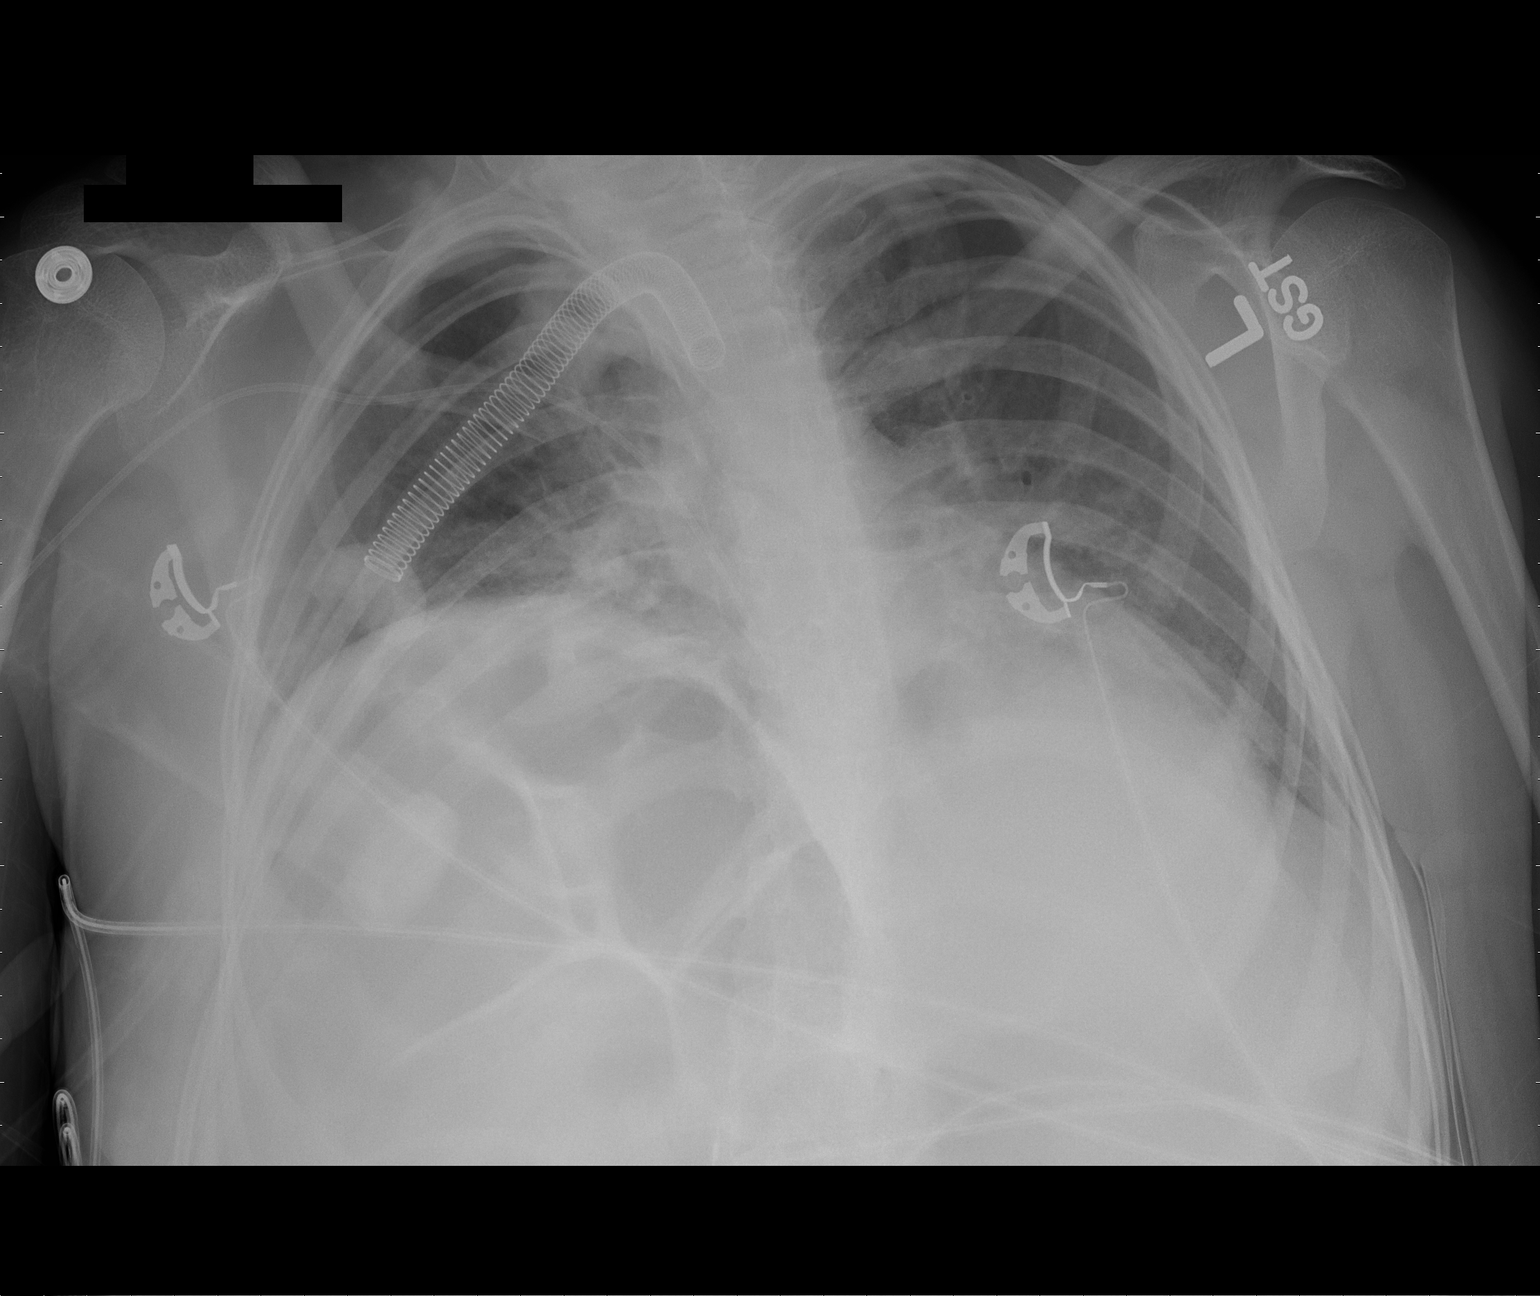

[1 of 1 positions shown; findings below may reference images not displayed]

FINDINGS: Tracheostomy tube and right arm PICC line stable.
Low lung volumes.
Elevated right diaphragm.
Upper normal heart size.
Persistent pulmonary opacities at bases unchanged.
No pneumothorax.
IMPRESSION: Persistent bibasilar opacities.

## 2014-02-28 IMAGING — CR DG CHEST 1V PORT
1 series · 1 of 1 positions shown · non-contrast
Comparison: 03/29/2011

CLINICAL DATA: Persistent bradycardia

PORTABLE CHEST - 1 VIEW

[view not recorded]
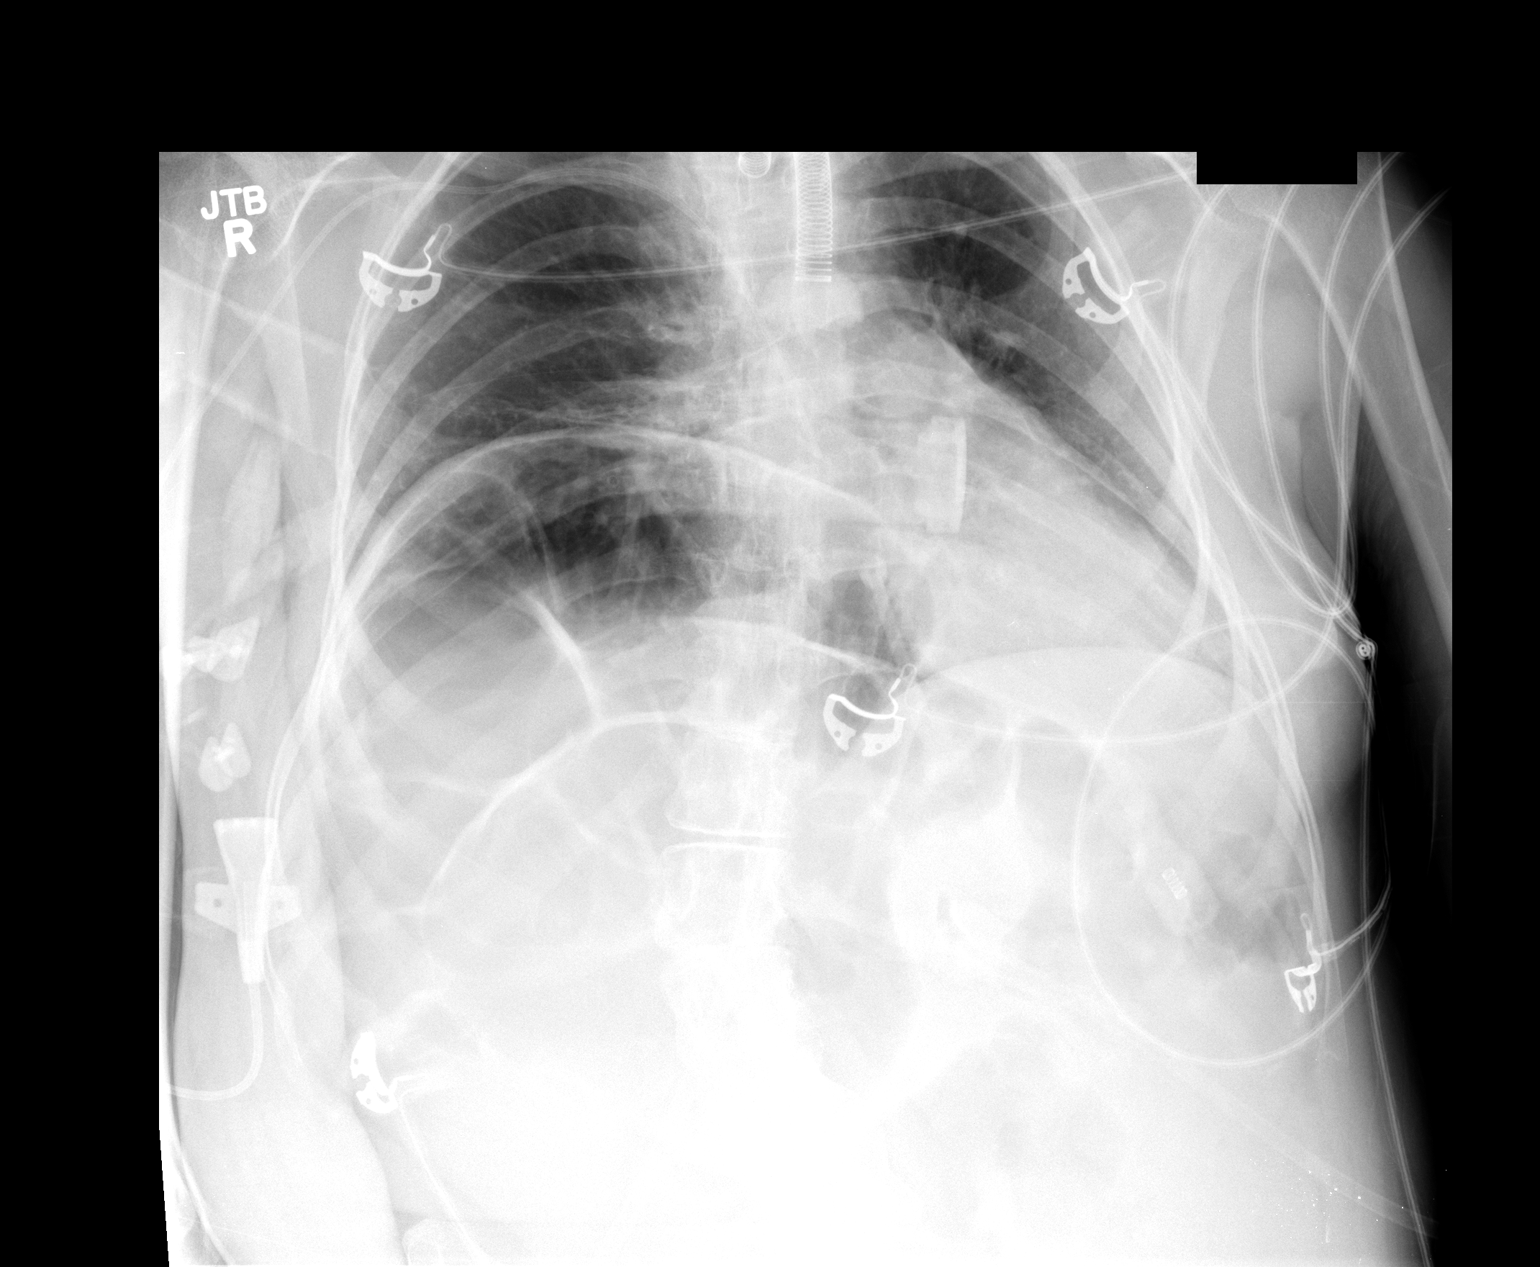

[1 of 1 positions shown; findings below may reference images not displayed]

FINDINGS: The tracheostomy tube tip is stable in position at the
level of the thoracic inlet.  There is a right arm PICC line with
tip in the SVC.

Heart size appears normal.  No pleural effusion or pulmonary edema.

There is marked gaseous distention of the bowel loops with
asymmetric elevation of the right hemidiaphragm.  No airspace
consolidation identified.
IMPRESSION: 1.  No significant interval change compared with earlier exam.

## 2014-03-01 IMAGING — CR DG CHEST 1V PORT
1 series · 1 of 1 positions shown · non-contrast
Comparison: 03/29/2011

CLINICAL DATA: Check endotracheal tube

PORTABLE CHEST - 1 VIEW

[AP]
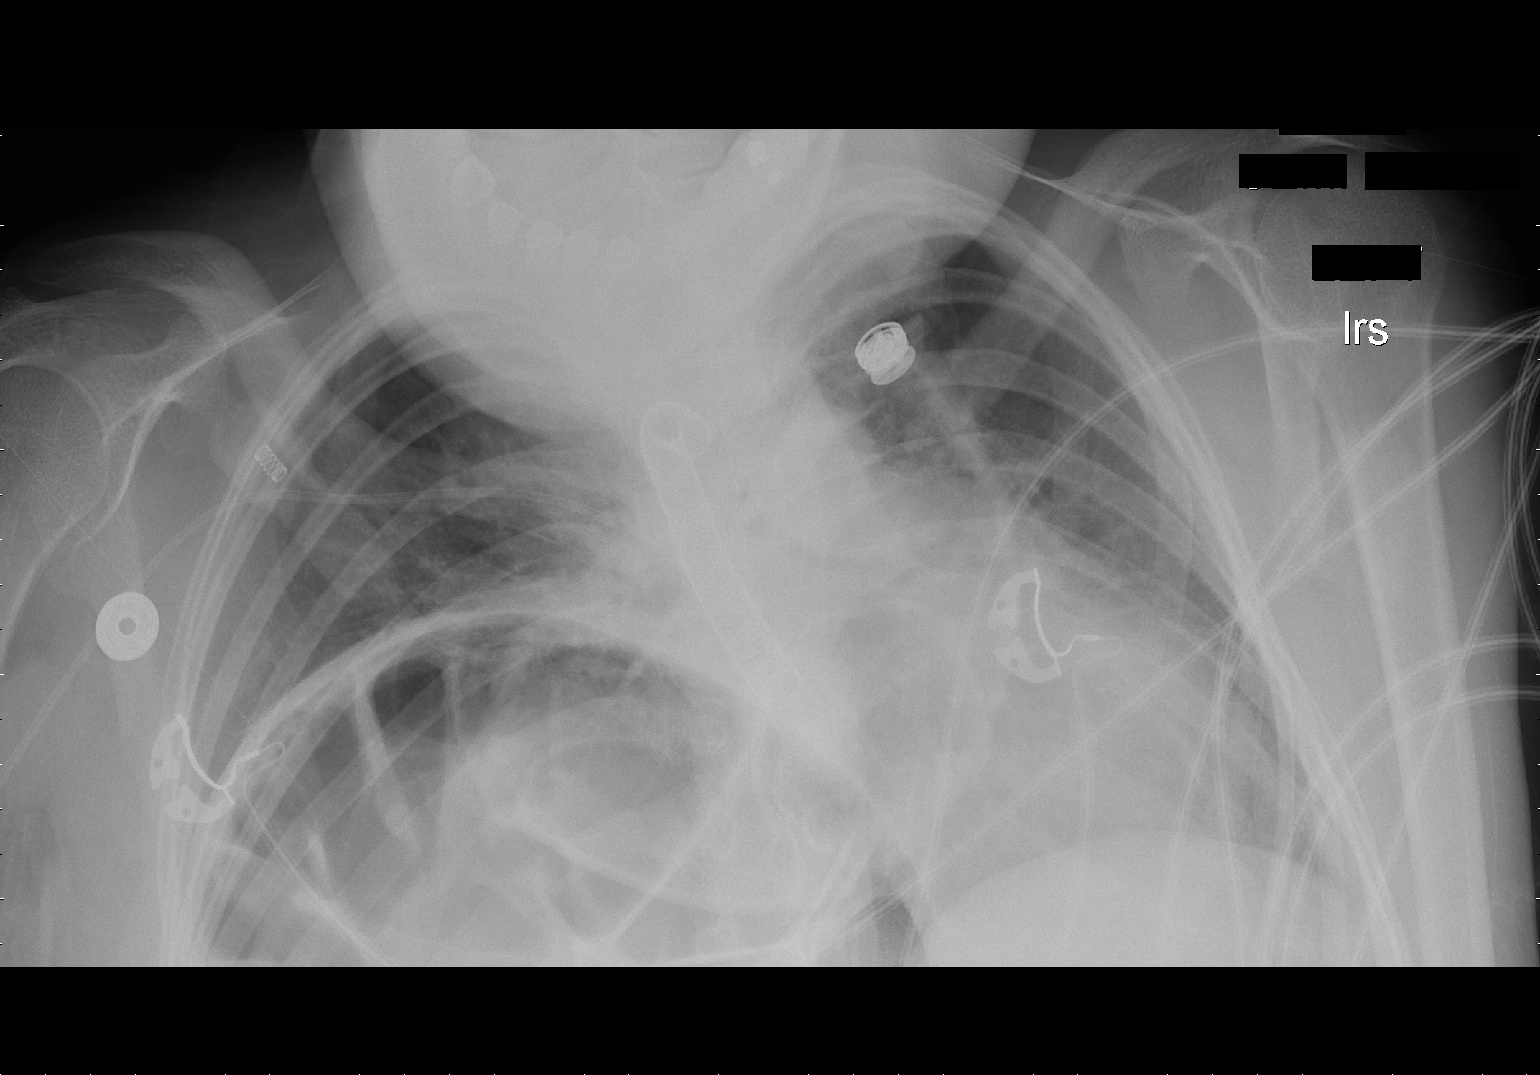

[1 of 1 positions shown; findings below may reference images not displayed]

FINDINGS: Cardiomediastinal silhouette is stable.  Stable tracheostomy tube
position.  There is  poor inspiration.  Central mild vascular
congestion without convincing pulmonary edema.  Again noted
significant elevation of the right hemidiaphragm.  Stable gaseous
colonic distention and colonic interposition under right
hemidiaphragm.  Mild basilar atelectasis.
IMPRESSION: Stable tracheostomy tube position.  There is  poor inspiration.
Central mild vascular congestion without convincing pulmonary
edema.  Again noted significant elevation of the right
hemidiaphragm.  Stable gaseous colonic distention and colonic
interposition under right hemidiaphragm.  Mild basilar atelectasis.

## 2014-03-03 IMAGING — CR DG CHEST 1V PORT
1 series · 1 of 1 positions shown · non-contrast
Comparison: 03/30/2011

CLINICAL DATA: Follow up of airspace disease.  Check tracheostomy
position.

PORTABLE CHEST - 1 VIEW

[AP]
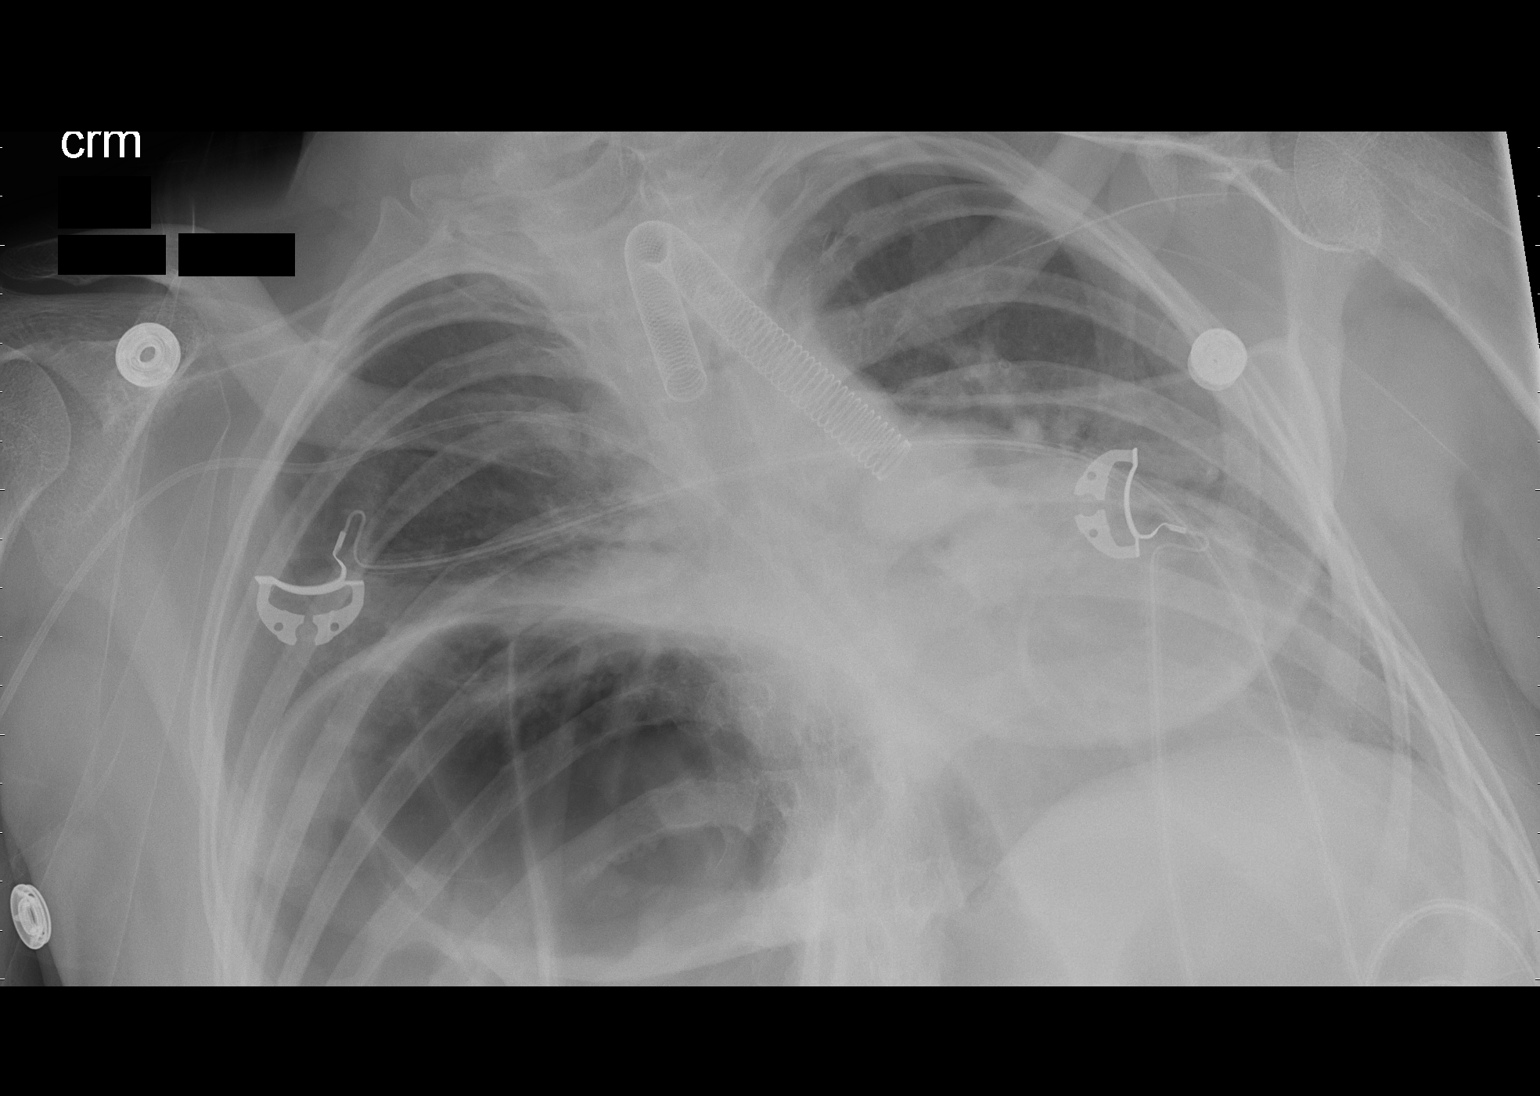

[1 of 1 positions shown; findings below may reference images not displayed]

FINDINGS: Moderate degradation, secondary to extremely low lung
volumes and patient positioning.  Tracheostomy is appropriately
positioned.  Right-sided PICC line is likely properly positioned,
with tip at low SVC.

Mild cardiomegaly.  Extremely low lung volumes.  Moderate to marked
right hemidiaphragm elevation, unchanged.  No definite pleural
effusion. No pneumothorax.  Similar bibasilar atelectasis with
vascular crowding.  Gas filled colon, including under the right
hemidiaphragm.
IMPRESSION: 1. No significant change since the prior exam.
2.  Extremely low lung volumes with bibasilar atelectasis.

## 2014-03-06 ENCOUNTER — Other Ambulatory Visit: Payer: Self-pay | Admitting: Family Medicine

## 2014-03-10 ENCOUNTER — Telehealth: Payer: Self-pay | Admitting: Family Medicine

## 2014-03-10 MED ORDER — MUPIROCIN 2 % EX OINT: 1.0000 "application " | TOPICAL_OINTMENT | Freq: Two times a day (BID) | CUTANEOUS | Status: DC | PRN

## 2014-03-10 NOTE — Telephone Encounter (Signed)
Pt needs refill on Bactroban ointment ran out an has a ulcer on her  Ankle right now.   [facility]

## 2014-03-10 NOTE — Telephone Encounter (Signed)
Med sent to pharmacy. Left message on voicemail notifying mom.  

## 2014-03-10 NOTE — Telephone Encounter (Signed)
Ok 6 ref 

## 2014-03-13 DIAGNOSIS — Z01810 Encounter for preprocedural cardiovascular examination: Secondary | ICD-10-CM | POA: Diagnosis not present

## 2014-03-13 DIAGNOSIS — Z01812 Encounter for preprocedural laboratory examination: Secondary | ICD-10-CM | POA: Diagnosis not present

## 2014-03-13 DIAGNOSIS — R000 Tachycardia, unspecified: Secondary | ICD-10-CM | POA: Diagnosis not present

## 2014-03-27 DIAGNOSIS — G4733 Obstructive sleep apnea (adult) (pediatric): Secondary | ICD-10-CM | POA: Diagnosis present

## 2014-03-27 DIAGNOSIS — J95821 Acute postprocedural respiratory failure: Secondary | ICD-10-CM | POA: Diagnosis present

## 2014-03-27 DIAGNOSIS — Z930 Tracheostomy status: Secondary | ICD-10-CM | POA: Diagnosis not present

## 2014-03-27 DIAGNOSIS — J9811 Atelectasis: Secondary | ICD-10-CM | POA: Diagnosis present

## 2014-03-27 DIAGNOSIS — J45909 Unspecified asthma, uncomplicated: Secondary | ICD-10-CM | POA: Diagnosis not present

## 2014-03-27 DIAGNOSIS — F419 Anxiety disorder, unspecified: Secondary | ICD-10-CM | POA: Diagnosis present

## 2014-03-27 DIAGNOSIS — F78 Other intellectual disabilities: Secondary | ICD-10-CM | POA: Diagnosis not present

## 2014-03-27 DIAGNOSIS — G4700 Insomnia, unspecified: Secondary | ICD-10-CM | POA: Diagnosis present

## 2014-03-27 DIAGNOSIS — M419 Scoliosis, unspecified: Secondary | ICD-10-CM | POA: Diagnosis present

## 2014-03-27 DIAGNOSIS — G809 Cerebral palsy, unspecified: Secondary | ICD-10-CM | POA: Diagnosis present

## 2014-03-27 DIAGNOSIS — M264 Malocclusion, unspecified: Secondary | ICD-10-CM | POA: Diagnosis not present

## 2014-03-27 DIAGNOSIS — R0902 Hypoxemia: Secondary | ICD-10-CM | POA: Diagnosis not present

## 2014-03-27 DIAGNOSIS — K219 Gastro-esophageal reflux disease without esophagitis: Secondary | ICD-10-CM | POA: Diagnosis present

## 2014-03-28 DIAGNOSIS — Z930 Tracheostomy status: Secondary | ICD-10-CM | POA: Diagnosis not present

## 2014-03-28 DIAGNOSIS — J45909 Unspecified asthma, uncomplicated: Secondary | ICD-10-CM | POA: Diagnosis not present

## 2014-03-28 DIAGNOSIS — J9811 Atelectasis: Secondary | ICD-10-CM | POA: Diagnosis not present

## 2014-03-31 ENCOUNTER — Telehealth: Payer: Self-pay | Admitting: *Deleted

## 2014-03-31 NOTE — Telephone Encounter (Signed)
Called Nimra's home heatlh nurse to find out why labs were drawn. She states they were preop labs. Pt was put to sleep to have teeth cleaned. Home health nurse states sodium was low on first bloodwork but was repeated before surgery on 3/10 and the day after surger 3/11 and sodium was normal on both. No meds were adjusted. Chart with lab results is in your office.

## 2014-04-03 ENCOUNTER — Other Ambulatory Visit: Payer: Self-pay | Admitting: Family Medicine

## 2014-04-03 ENCOUNTER — Telehealth: Payer: Self-pay | Admitting: Family Medicine

## 2014-04-03 NOTE — Telephone Encounter (Signed)
Last labs 06/13/13 bmp, cbc

## 2014-04-03 NOTE — Telephone Encounter (Signed)
no

## 2014-04-03 NOTE — Telephone Encounter (Signed)
Does patient need labs ordered for upcoming visit?

## 2014-04-04 NOTE — Telephone Encounter (Signed)
Discussed with mother

## 2014-04-07 ENCOUNTER — Telehealth: Payer: Self-pay

## 2014-04-07 MED ORDER — DOXYCYCLINE HYCLATE 100 MG PO TABS: 100.0000 mg | ORAL_TABLET | Freq: Two times a day (BID) | ORAL | Status: DC

## 2014-04-07 NOTE — Telephone Encounter (Signed)
[name], patient's home health nurse called and stated that patient normally has a callus on her left ankle but for the past few days the area has become swollen, red, warm to touch and drainage is noted (5-6 cm in diameter). The redness is going up the left leg and stops at the top of the shin. The nurse has been cleaning, soaking the area and applying bactroban ointment when necessary. Can an antibiotic be called in or does patient need to be seen? Nurse states that patient cannot have Cipro. [facility] (please deliver)

## 2014-04-07 NOTE — Telephone Encounter (Signed)
Notified [name] med sent to pharmacy, BID dressing changes, keep eye on red streak and vitals. [name] verbalized understanding.

## 2014-04-07 NOTE — Telephone Encounter (Signed)
Doxy 100 mg bid for ten d, bid dressing changes, keep eye on red streak and vital

## 2014-04-08 DIAGNOSIS — G40901 Epilepsy, unspecified, not intractable, with status epilepticus: Secondary | ICD-10-CM | POA: Diagnosis not present

## 2014-04-08 DIAGNOSIS — F72 Severe intellectual disabilities: Secondary | ICD-10-CM | POA: Diagnosis not present

## 2014-04-08 DIAGNOSIS — K315 Obstruction of duodenum: Secondary | ICD-10-CM | POA: Diagnosis not present

## 2014-04-08 DIAGNOSIS — J9610 Chronic respiratory failure, unspecified whether with hypoxia or hypercapnia: Secondary | ICD-10-CM | POA: Diagnosis not present

## 2014-04-08 DIAGNOSIS — G809 Cerebral palsy, unspecified: Secondary | ICD-10-CM | POA: Diagnosis not present

## 2014-04-14 ENCOUNTER — Other Ambulatory Visit: Payer: Self-pay | Admitting: Family Medicine

## 2014-04-14 NOTE — Telephone Encounter (Signed)
Ok this and 4 refills 

## 2014-04-15 ENCOUNTER — Other Ambulatory Visit: Payer: Self-pay | Admitting: Family Medicine

## 2014-04-22 DIAGNOSIS — F72 Severe intellectual disabilities: Secondary | ICD-10-CM | POA: Diagnosis not present

## 2014-04-22 DIAGNOSIS — J9610 Chronic respiratory failure, unspecified whether with hypoxia or hypercapnia: Secondary | ICD-10-CM | POA: Diagnosis not present

## 2014-04-22 DIAGNOSIS — G40901 Epilepsy, unspecified, not intractable, with status epilepticus: Secondary | ICD-10-CM | POA: Diagnosis not present

## 2014-04-22 DIAGNOSIS — G809 Cerebral palsy, unspecified: Secondary | ICD-10-CM | POA: Diagnosis not present

## 2014-04-22 DIAGNOSIS — K315 Obstruction of duodenum: Secondary | ICD-10-CM | POA: Diagnosis not present

## 2014-04-28 ENCOUNTER — Telehealth: Payer: Self-pay | Admitting: Family Medicine

## 2014-04-28 ENCOUNTER — Other Ambulatory Visit: Payer: Self-pay | Admitting: *Deleted

## 2014-04-28 MED ORDER — DOXYCYCLINE HYCLATE 100 MG PO TABS: 100.0000 mg | ORAL_TABLET | Freq: Two times a day (BID) | ORAL | Status: DC

## 2014-04-28 NOTE — Telephone Encounter (Signed)
Doxy tab or susp 100 mg bid for ten d (if tab will need to grind up before feeding tube applic)

## 2014-04-28 NOTE — Telephone Encounter (Signed)
Discussed with RN. Doxy sent to [facility]. [name] also requesting verbal order to use bactroban ointment. She already has the medication.

## 2014-04-28 NOTE — Telephone Encounter (Signed)
ok 

## 2014-04-28 NOTE — Telephone Encounter (Signed)
Discussed with home health nurse Selena BattenKim.

## 2014-04-28 NOTE — Telephone Encounter (Signed)
Left ankle wound, been treating it but its now really red  And warm to the touch around the wound. No drainage as Of yet to the area. Home Health RN would like to know If you think she need a round of anti biotic.   [facility]   [phone]  Ernest PineKim Green RN

## 2014-05-06 ENCOUNTER — Telehealth: Payer: Self-pay | Admitting: *Deleted

## 2014-05-06 ENCOUNTER — Other Ambulatory Visit: Payer: Self-pay | Admitting: Family Medicine

## 2014-05-06 NOTE — Telephone Encounter (Signed)
Lauren MinerKem [name] home health nurse calling to see if pt needs bloodwork before her appt on may 1st. Please call Lauren  Back at [phone] if pt needs bw.

## 2014-05-07 NOTE — Telephone Encounter (Signed)
Discussed with Lou MinerKem Green home health nurse-verbalized understanding.

## 2014-05-07 NOTE — Telephone Encounter (Signed)
Go ahead and do m7 cbc liv zymes

## 2014-05-12 DIAGNOSIS — R5383 Other fatigue: Secondary | ICD-10-CM | POA: Diagnosis not present

## 2014-05-12 DIAGNOSIS — Z79899 Other long term (current) drug therapy: Secondary | ICD-10-CM | POA: Diagnosis not present

## 2014-05-20 ENCOUNTER — Ambulatory Visit (INDEPENDENT_AMBULATORY_CARE_PROVIDER_SITE_OTHER): Payer: Medicare Other | Admitting: Family Medicine

## 2014-05-20 DIAGNOSIS — G809 Cerebral palsy, unspecified: Secondary | ICD-10-CM

## 2014-05-20 DIAGNOSIS — K219 Gastro-esophageal reflux disease without esophagitis: Secondary | ICD-10-CM

## 2014-05-20 DIAGNOSIS — G40209 Localization-related (focal) (partial) symptomatic epilepsy and epileptic syndromes with complex partial seizures, not intractable, without status epilepticus: Secondary | ICD-10-CM | POA: Diagnosis not present

## 2014-05-20 DIAGNOSIS — Z0000 Encounter for general adult medical examination without abnormal findings: Secondary | ICD-10-CM | POA: Diagnosis not present

## 2014-05-20 NOTE — Progress Notes (Signed)
   Subjective:    Patient ID: [name], female    DOB: 06/16/1985, 29 y.o.   MRN: [id number]  HPIMed check up.  Check left foot and ankle. Using bactroban oint and has been thorugh two rounds of doxy.   Continues to have pressure sore issues and medial malleolus left ankle. Had more inflammation redness couple weeks ago. On doxycycline currently.   Concerned about weight gain. Weight today is 99 lbscontinues to gain weight on current supplement.   Compliant with seizure medications. Stable per family.Discuss bloodwork results.    Review of Systems  Constitutional: Negative for activity change, appetite change and fatigue.  HENT: Negative for congestion, ear discharge and rhinorrhea.   Eyes: Negative for discharge.  Respiratory: Negative for cough, chest tightness and wheezing.   Cardiovascular: Negative for chest pain.  Gastrointestinal: Negative for vomiting and abdominal pain.  Genitourinary: Negative for frequency and difficulty urinating.  Musculoskeletal: Negative for neck pain.  Allergic/Immunologic: Negative for environmental allergies and food allergies.  Neurological: Negative for weakness and headaches.  Psychiatric/Behavioral: Negative for behavioral problems and agitation.       Objective:   Physical Exam  Constitutional: She is oriented to person, place, and time. She appears well-developed and well-nourished.  Baseline contractures present  HENT:  Head: Normocephalic.  Right Ear: External ear normal.  Left Ear: External ear normal.  Eyes: Pupils are equal, round, and reactive to light.  Neck: Normal range of motion. No thyromegaly present.  Cardiovascular: Normal rate, regular rhythm, normal heart sounds and intact distal pulses.   No murmur heard. Pulmonary/Chest: Effort normal and breath sounds normal. No respiratory distress. She has no wheezes.  Chronic upper respiratory congestion present  Abdominal: Soft. Bowel sounds are normal. She exhibits no  distension and no mass. There is no tenderness.  No obvious masses or tenderness  Genitourinary:  Pelvic exam not performed  Musculoskeletal: Normal range of motion. She exhibits no edema or tenderness.  Lymphadenopathy:    She has no cervical adenopathy.  Neurological: She is alert and oriented to person, place, and time. She exhibits normal muscle tone.  Skin: Skin is warm and dry.  Malleoli or erythematous healing pressure ulcer.  Psychiatric: She has a normal mood and affect. Her behavior is normal.  Vitals reviewed.         Assessment & Plan:  Impression #1 preventive exam #2 seizure disorder medications stable #3 nutrition concerns discussed #4 pressure ULCER present. Stable on antibiotics. Plan finish antibiotics. When management discussed. Maintain same dose of seizure medications. Back off on formula by 10%. Will decrease middle of night allotment. Continue to be followed closely by home health [facility]

## 2014-05-21 ENCOUNTER — Telehealth: Payer: Self-pay | Admitting: Family Medicine

## 2014-05-21 MED ORDER — KETOCONAZOLE 2 % EX CREA: 1.0000 "application " | TOPICAL_CREAM | Freq: Two times a day (BID) | CUTANEOUS | Status: DC | PRN

## 2014-05-21 NOTE — Telephone Encounter (Signed)
Patietn was supposed to have a cream for yeast infection in between abdominal folds called in yesterday but pharmacy said that they dont have anything. Please advise.   [facility]

## 2014-05-21 NOTE — Telephone Encounter (Signed)
ketocon cr 45 g bid to affected area prn 6 ref

## 2014-05-21 NOTE — Telephone Encounter (Signed)
Notified [name] (nurse) that medication was sent to pharmacy.

## 2014-05-28 ENCOUNTER — Telehealth: Payer: Self-pay | Admitting: Family Medicine

## 2014-05-28 NOTE — Telephone Encounter (Signed)
Calton GoldsGeez, ok (mo was demanding decrease last wk)

## 2014-05-28 NOTE — Telephone Encounter (Signed)
Discussed with nurse. Nurse verbalized understanding.

## 2014-05-28 NOTE — Telephone Encounter (Signed)
Last week patient saw Dr. Brett CanalesSteve and Dr. Brett CanalesSteve decreased her 2 AM feedings from 240 mL to 120 mL.  Patient is having increased secretions resulting in more nebulizer treatments and sucking.  Her mother thinks this is because of the change in feeding although nurse told her that this was probably not the cause.  Her mother is requesting that she go back up to 240 mL.

## 2014-06-02 ENCOUNTER — Other Ambulatory Visit: Payer: Medicare Other | Admitting: *Deleted

## 2014-06-02 ENCOUNTER — Telehealth: Payer: Self-pay | Admitting: *Deleted

## 2014-06-02 MED ORDER — CEFDINIR 250 MG/5ML PO SUSR
ORAL | Status: DC
Start: 2014-06-02 — End: 2014-07-28

## 2014-06-02 NOTE — Telephone Encounter (Signed)
cultutre wound, omnicef 300 susp bid for ten d, daily dressing changes

## 2014-06-02 NOTE — Telephone Encounter (Signed)
Discussed with home health nurse Selena BattenKim. Culture wound, daily dressing changes and med sent to [facility] to deliver today.

## 2014-06-02 NOTE — Telephone Encounter (Signed)
Home Health Nurse Selena BattenKim called to report the wound on left inner ankle was healing but it opened up and is bleeding. Redness 2 -3 inches around it and warm to the touch. Low grade fever 99.4 -99.5 since last night. [name]'s number [phone]

## 2014-06-03 DIAGNOSIS — L89529 Pressure ulcer of left ankle, unspecified stage: Secondary | ICD-10-CM | POA: Diagnosis not present

## 2014-06-06 ENCOUNTER — Telehealth: Payer: Self-pay | Admitting: Family Medicine

## 2014-06-06 NOTE — Telephone Encounter (Signed)
Patient had been having issues with her ankle and the last time she was seen, we decided to send off for a culture.  Mom calling to check on results.

## 2014-06-06 NOTE — Telephone Encounter (Signed)
Discussed with mother

## 2014-06-06 NOTE — Telephone Encounter (Signed)
somehat resist staph, but not mrsa, should respond to current abx

## 2014-06-06 NOTE — Telephone Encounter (Signed)
Started omnicef 250 bid for 10days on 06/02/14. Mother states wound is getting better. Redness is going down. Still a little swollen. No fever. Final report on culture was faxed and is in your office on bookshelf.

## 2014-06-10 DIAGNOSIS — F72 Severe intellectual disabilities: Secondary | ICD-10-CM | POA: Diagnosis not present

## 2014-06-10 DIAGNOSIS — J9610 Chronic respiratory failure, unspecified whether with hypoxia or hypercapnia: Secondary | ICD-10-CM | POA: Diagnosis not present

## 2014-06-10 DIAGNOSIS — G809 Cerebral palsy, unspecified: Secondary | ICD-10-CM | POA: Diagnosis not present

## 2014-06-10 DIAGNOSIS — K315 Obstruction of duodenum: Secondary | ICD-10-CM | POA: Diagnosis not present

## 2014-06-10 DIAGNOSIS — G40901 Epilepsy, unspecified, not intractable, with status epilepticus: Secondary | ICD-10-CM | POA: Diagnosis not present

## 2014-06-17 ENCOUNTER — Other Ambulatory Visit: Payer: Self-pay | Admitting: Family Medicine

## 2014-06-17 NOTE — Telephone Encounter (Signed)
Ok refill flonase prn and refill diazepam times 5

## 2014-06-24 ENCOUNTER — Encounter: Payer: Self-pay | Admitting: Family Medicine

## 2014-06-24 ENCOUNTER — Other Ambulatory Visit: Payer: Self-pay | Admitting: Family Medicine

## 2014-06-30 ENCOUNTER — Other Ambulatory Visit: Payer: Self-pay | Admitting: Family Medicine

## 2014-07-03 ENCOUNTER — Telehealth: Payer: Self-pay | Admitting: Family Medicine

## 2014-07-03 ENCOUNTER — Other Ambulatory Visit: Payer: Self-pay

## 2014-07-03 MED ORDER — FLUCONAZOLE 150 MG PO TABS: ORAL_TABLET | ORAL | Status: DC

## 2014-07-03 NOTE — Telephone Encounter (Signed)
Home health nurse called about patient having milky white vaginal discharge with redness and irritation. Diflucan 150 MG tablets 3 days apart via G tube per Dr. Brett Canales.

## 2014-07-28 ENCOUNTER — Telehealth: Payer: Self-pay | Admitting: *Deleted

## 2014-07-28 ENCOUNTER — Other Ambulatory Visit: Payer: Self-pay | Admitting: *Deleted

## 2014-07-28 MED ORDER — DOXYCYCLINE HYCLATE 100 MG PO TABS: 100.0000 mg | ORAL_TABLET | Freq: Two times a day (BID) | ORAL | Status: DC

## 2014-07-28 MED ORDER — CEFDINIR 250 MG/5ML PO SUSR: ORAL | Status: DC

## 2014-07-28 NOTE — Telephone Encounter (Signed)
Go back and ref antibiotic last prescribed for this

## 2014-07-28 NOTE — Telephone Encounter (Signed)
[name]'s number [phone]

## 2014-07-28 NOTE — Telephone Encounter (Signed)
Discussed with Selena BattenKim. omnicef sent to [facility] to deliver.

## 2014-07-28 NOTE — Telephone Encounter (Signed)
[facility] calling to report the wound on [name]'s ankle had healed but this am when she got there it had came back open. Area is warm and red and spreading to bottom of foot. Low grade fever this am 99.0. [facility].

## 2014-08-01 ENCOUNTER — Telehealth: Payer: Self-pay | Admitting: Family Medicine

## 2014-08-01 ENCOUNTER — Emergency Department (HOSPITAL_COMMUNITY): Payer: Medicare Other

## 2014-08-01 ENCOUNTER — Emergency Department (HOSPITAL_COMMUNITY)
Admission: EM | Admit: 2014-08-01 | Discharge: 2014-08-01 | Disposition: A | Discharge status: Home / Self Care | Payer: Medicare Other | Attending: Emergency Medicine | Admitting: Emergency Medicine

## 2014-08-01 ENCOUNTER — Encounter (HOSPITAL_COMMUNITY): Payer: Self-pay | Admitting: Cardiology

## 2014-08-01 DIAGNOSIS — Z880 Allergy status to penicillin: Secondary | ICD-10-CM | POA: Insufficient documentation

## 2014-08-01 DIAGNOSIS — Z79899 Other long term (current) drug therapy: Secondary | ICD-10-CM | POA: Diagnosis not present

## 2014-08-01 DIAGNOSIS — R404 Transient alteration of awareness: Secondary | ICD-10-CM | POA: Diagnosis not present

## 2014-08-01 DIAGNOSIS — G40909 Epilepsy, unspecified, not intractable, without status epilepticus: Secondary | ICD-10-CM | POA: Insufficient documentation

## 2014-08-01 DIAGNOSIS — Z792 Long term (current) use of antibiotics: Secondary | ICD-10-CM | POA: Diagnosis not present

## 2014-08-01 DIAGNOSIS — L03116 Cellulitis of left lower limb: Secondary | ICD-10-CM | POA: Insufficient documentation

## 2014-08-01 DIAGNOSIS — Z8719 Personal history of other diseases of the digestive system: Secondary | ICD-10-CM | POA: Insufficient documentation

## 2014-08-01 DIAGNOSIS — Z8659 Personal history of other mental and behavioral disorders: Secondary | ICD-10-CM | POA: Insufficient documentation

## 2014-08-01 DIAGNOSIS — R4182 Altered mental status, unspecified: Secondary | ICD-10-CM | POA: Diagnosis present

## 2014-08-01 DIAGNOSIS — R918 Other nonspecific abnormal finding of lung field: Secondary | ICD-10-CM | POA: Diagnosis not present

## 2014-08-01 DIAGNOSIS — R40243 Glasgow coma scale score 3-8: Secondary | ICD-10-CM | POA: Diagnosis not present

## 2014-08-01 DIAGNOSIS — K219 Gastro-esophageal reflux disease without esophagitis: Secondary | ICD-10-CM | POA: Diagnosis not present

## 2014-08-01 LAB — URINALYSIS, ROUTINE W REFLEX MICROSCOPIC
BILIRUBIN URINE: NEGATIVE
Glucose, UA: NEGATIVE mg/dL
HGB URINE DIPSTICK: NEGATIVE
KETONES UR: NEGATIVE mg/dL
Leukocytes, UA: NEGATIVE
NITRITE: NEGATIVE
PH: 7 (ref 5.0–8.0)
PROTEIN: NEGATIVE mg/dL
SPECIFIC GRAVITY, URINE: 1.01 (ref 1.005–1.030)
Urobilinogen, UA: 0.2 mg/dL (ref 0.0–1.0)

## 2014-08-01 LAB — BASIC METABOLIC PANEL
Anion gap: 9 (ref 5–15)
BUN: 5 mg/dL — ABNORMAL LOW (ref 6–20)
CHLORIDE: 88 mmol/L — AB (ref 101–111)
CO2: 33 mmol/L — ABNORMAL HIGH (ref 22–32)
Calcium: 8.7 mg/dL — ABNORMAL LOW (ref 8.9–10.3)
Creatinine, Ser: <0.3 mg/dL — ABNORMAL LOW (ref 0.44–1.00)
Glucose, Bld: 113 mg/dL — ABNORMAL HIGH (ref 65–99)
Potassium: 3.7 mmol/L (ref 3.5–5.1)
Sodium: 130 mmol/L — ABNORMAL LOW (ref 135–145)

## 2014-08-01 LAB — CBC WITH DIFFERENTIAL/PLATELET
BASOS ABS: 0 10*3/uL (ref 0.0–0.1)
Basophils Relative: 0 % (ref 0–1)
EOS ABS: 0.1 10*3/uL (ref 0.0–0.7)
EOS PCT: 1 % (ref 0–5)
HCT: 39.1 % (ref 36.0–46.0)
Hemoglobin: 12.2 g/dL (ref 12.0–15.0)
LYMPHS ABS: 1.7 10*3/uL (ref 0.7–4.0)
Lymphocytes Relative: 30 % (ref 12–46)
MCH: 25.8 pg — ABNORMAL LOW (ref 26.0–34.0)
MCHC: 31.2 g/dL (ref 30.0–36.0)
MCV: 82.8 fL (ref 78.0–100.0)
Monocytes Absolute: 0.7 10*3/uL (ref 0.1–1.0)
Monocytes Relative: 13 % — ABNORMAL HIGH (ref 3–12)
NEUTROS ABS: 3.1 10*3/uL (ref 1.7–7.7)
NEUTROS PCT: 56 % (ref 43–77)
Platelets: 84 10*3/uL — ABNORMAL LOW (ref 150–400)
RBC: 4.72 MIL/uL (ref 3.87–5.11)
RDW: 13.1 % (ref 11.5–15.5)
WBC: 5.5 10*3/uL (ref 4.0–10.5)

## 2014-08-01 LAB — VALPROIC ACID LEVEL: Valproic Acid Lvl: 110 ug/mL — ABNORMAL HIGH (ref 50.0–100.0)

## 2014-08-01 MED ORDER — CLINDAMYCIN PALMITATE HCL 75 MG/5ML PO SOLR: 300.0000 mg | Freq: Three times a day (TID) | ORAL | Status: AC

## 2014-08-01 MED ORDER — ACYCLOVIR 200 MG/5ML PO SUSP: 400.0000 mg | Freq: Four times a day (QID) | ORAL | Status: AC

## 2014-08-01 MED ORDER — ACYCLOVIR 400 MG PO TABS: 400.0000 mg | ORAL_TABLET | Freq: Four times a day (QID) | ORAL | Status: DC

## 2014-08-01 NOTE — ED Notes (Signed)
Pt being treated for wound on left ankle  with cefdinir since 07/28/14.  This morning had an episode this morning where she was unresponsive and her sats were 81% on 4 liters oxygen.  Family states she is having an increase of secretions.  No urine output since yesterday.  Also has yeast infection and rash around mouth.  Family concerned she is having a reaction to the antibiodic.

## 2014-08-01 NOTE — ED Provider Notes (Signed)
CSN: [id number]     Arrival date & time 08/01/14  1057 History  This chart was scribed for Zadie Rhineonald Chrishawn Boley, MD by Marica OtterNusrat Rahman, ED Scribe. This patient was seen in room APA11/APA11 and the patient's care was started at 11:31 AM.  Level 5 Caveat: Acuity of Condition   Chief Complaint  Patient presents with  . Altered Mental Status   Patient is a 29 y.o. female presenting with altered mental status. The history is provided by a relative.  Altered Mental Status  PCP: Lubertha SouthSteve Luking, MD HPI Comments: [name] is a 29 y.o. female, directed to come to the ED by her PCP office, with PMHx noted below including severe mental retardation, nonverbal and a current yeast infection, who presents to the Emergency Department complaining of altered mental status onset this morning. Mom reports pt became unresponsive this morning and her O2 dropped to 81% on 4L/min. Per family, pt started antibiotics (cefdinir) for a wound to her left ankle on 07/28/14-- family is concerned that pt may be having an allergic reaction to the antibiotics. Relative reports her last dose of antibiotic was at 2AM. Relative denies fever,  Depacene levels were last checked at the end of 04/2014.   BP 132/78.   Past Medical History  Diagnosis Date  . Cerebral palsy   . Epilepsy   . Ileus   . Reflux esophagitis   . Scoliosis   . Seizures   . Allergy   . Severe mental retardation    Past Surgical History  Procedure Laterality Date  . Gastrostomy w/ feeding tube    . Trach,g-tube    . Trach     History reviewed. No pertinent family history. History  Substance Use Topics  . Smoking status: Never Smoker   . Smokeless tobacco: Not on file  . Alcohol Use: No   OB History    No data available     Review of Systems  Unable to perform ROS: Patient nonverbal  acuity of condition    Allergies  Amoxicillin; Morphine and related; Ceftazidime; and Ciprocinonide  Home Medications   Prior to Admission medications    Medication Sig Start Date End Date Taking? Authorizing Provider  acetaminophen (TYLENOL) 500 MG tablet 500 mg by Feeding Tube route every 6 (six) hours as needed. Discomfort/Pain    Historical Provider, MD  acyclovir (ZOVIRAX) 200 MG capsule TAKE 2 CAPSULES BY MOUTH FOUR TIMES DAILY FOR 5 DAYS. OPEN CONTENTS AND SPRINKLE IN APPLESAUCE. 06/24/14   Merlyn AlbertWilliam S Luking, MD  albuterol (PROVENTIL) (2.5 MG/3ML) 0.083% nebulizer solution USE 1 VIAL IN NEBULIZER EVERY 4 TO 6 HOURS AS NEEDED. 05/07/14   Merlyn AlbertWilliam S Luking, MD  ALPRAZolam Prudy Feeler(XANAX) 0.5 MG tablet TAKE 1/2-1 TABLET BY MOUTH EVERY 6 HOURS AS NEEDED FOR SLEEP OR ANXIETY. 11/26/13   Merlyn AlbertWilliam S Luking, MD  baclofen (LIORESAL) 20 MG tablet TAKE 4 ML BY MOUTH FOUR TIMES A DAY. 04/17/14   Merlyn AlbertWilliam S Luking, MD  carbamazepine (TEGRETOL) 100 MG chewable tablet TAKE 3 TABLETS BY MOUTH IN THE MORNING, 2 IN THE AFTERNOON, AND 3 TABS AT BEDTIME. 12/11/13   Merlyn AlbertWilliam S Luking, MD  cefdinir (OMNICEF) 250 MG/5ML suspension TAKE 300MG  BID FOR 10 DAYS 07/28/14   Merlyn AlbertWilliam S Luking, MD  cetirizine HCl (ZYRTEC) 5 MG/5ML SYRP 10 mg by Feeding Tube route daily.    Historical Provider, MD  CETIRIZINE HCL CHILDRENS ALRGY 1 MG/ML SYRP TAKE 2 TEASPOONFULS BY MOUTH ONCE DAILY. 12/11/13   Merlyn AlbertWilliam S Luking, MD  ciprofloxacin (CIPRO) 500 MG tablet Take 1 tablet (500 mg total) by mouth 2 (two) times daily. 01/09/14   Merlyn Albert, MD  diazepam (DIASTAT ACUDIAL) 10 MG GEL INJECT 5 MG RECTALLY AS DIRECTED FOR SEIZURES. 06/17/14   Merlyn Albert, MD  fluconazole (DIFLUCAN) 150 MG tablet Take 1 tablet  3 days apart via G-tube. 07/03/14   Merlyn Albert, MD  fluticasone (FLONASE) 50 MCG/ACT nasal spray USE 2 SPRAYS IN EACH NOSTRIL DAILY. 06/17/14   Merlyn Albert, MD  glycopyrrolate (ROBINUL) 1 MG tablet TAKE 2 & 1/2 TABLETS BY MOUTH TWICE A DAY AS DIRECTED. 06/30/14   Merlyn Albert, MD  guaifenesin (ROBITUSSIN) 100 MG/5ML syrup Give 10-15 mLs by tube 4 (four) times daily as needed.  Congestion    Historical Provider, MD  hydrocortisone 2.5 % cream Apply 1 application topically 3 (three) times daily as needed. Redness    Historical Provider, MD  ibuprofen (ADVIL,MOTRIN) 100 MG/5ML suspension Give 250 mg by tube every 4 (four) hours as needed. Pain/Fever    Historical Provider, MD  ketoconazole (NIZORAL) 2 % cream Apply 1 application topically 2 (two) times daily as needed for irritation. 05/21/14   Merlyn Albert, MD  Melatonin 1 MG TABS 1 tablet by Feeding Tube route at bedtime.     Historical Provider, MD  mupirocin ointment (BACTROBAN) 2 % Apply 1 application topically 2 (two) times daily as needed. 03/10/14   Merlyn Albert, MD  nystatin ointment (MYCOSTATIN) APPLY TO AFFECTED AREAS THREE TIMES DAILY. 06/24/14   Merlyn Albert, MD  pantoprazole (PROTONIX) 40 MG tablet TAKE ONE TABLET BY MOUTH DAILY. 04/16/14   Merlyn Albert, MD  polyethylene glycol powder (GLYCOLAX/MIRALAX) powder TAKE AS DIRECTED EVERY 8 HOURS AS NEEDED FOR CONSTIPATION. 04/17/14   Merlyn Albert, MD  silver sulfADIAZINE (SILVADENE) 1 % cream Apply 1 application topically daily. Spot on her toe    Historical Provider, MD  Sodium Phosphates (FLEET ENEMA RE) Place 1 application rectally daily as needed. Constipation    Historical Provider, MD  sorbitol 70 % solution PUT 2 TABLESPOONFULS DOWN MICKEY EVERY 6 HOURS AS NEEDED. 04/03/14   Merlyn Albert, MD  Valproic Acid (DEPAKENE) 250 MG/5ML SYRP syrup GIVE 12.5 MLS VIA FEEDING TUBE 3 TIMES A DAY 11/06/13   Merlyn Albert, MD  Wheat Dextrin (BENEFIBER PO) 15 mLs by Feeding Tube route 4 (four) times daily.     Historical Provider, MD  zolpidem (AMBIEN) 5 MG tablet TAKE ONE TABLET BY MOUTH AT BEDTIME AS NEEDED. 04/14/14   Babs Sciara, MD   Triage Vitals: BP 140/93 mmHg  Pulse 98  Temp(Src) 98.6 F (37 C) (Rectal)  Resp 28  Wt 99 lb (44.906 kg)  SpO2 97%  LMP 07/09/2014 Physical Exam CONSTITUTIONAL: Ill appearing.  HEAD:  Normocephalic/atraumatic EYES: EOMI/PERRL ENMT: Mucous membranes moist. Tongue protrusion.  NECK: trach in place.  CV: S1/S2 noted, no murmurs/rubs/gallops noted LUNGS: coarse breath sounds bilaterally.  ABDOMEN: soft, nontender, gtube in place NEURO: Pt is awake/alert EXTREMITIES: wound to left foot, no drainage noted chronic contractures noted.  SKIN: warm, color normal PSYCH: unable to assess.   ED Course  Procedures  DIAGNOSTIC STUDIES: Oxygen Saturation is 97% on trach collar.   COORDINATION OF CARE: 11:39 AM-Discussed treatment plan with family at bedside. Family verbalizes understanding and agrees with treatment plan.  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  They request acyclovir as she has increased in lesions to mouth Urine culture ordered, advised they may be called with results but defer start ABX for urine at this time    Labs Review Labs Reviewed  CBC WITH DIFFERENTIAL/PLATELET - Abnormal; Notable for the following:    MCH 25.8 (*)    Platelets 84 (*)    Monocytes Relative 13 (*)    All other components within normal limits  BASIC METABOLIC PANEL - Abnormal; Notable for the following:    Sodium 130 (*)    Chloride 88 (*)    CO2 33 (*)    Glucose, Bld 113 (*)    BUN 5 (*)    Creatinine, Ser <0.30 (*)    Calcium 8.7 (*)    All other components within normal limits  VALPROIC ACID LEVEL - Abnormal; Notable for the following:    Valproic Acid Lvl 110 (*)    All other components within normal limits  URINE CULTURE  URINALYSIS, ROUTINE W REFLEX MICROSCOPIC (NOT AT [facility])    Imaging  Review Dg Chest Portable 1 View  08/01/2014   CLINICAL DATA:  Patient's nonverbal. Altered mental status. Patient became unresponsive this morning. Oxygen dropped to 81%. On antibiotics for left ankle wound.  EXAM: PORTABLE CHEST - 1 VIEW  COMPARISON:  06/13/2013  FINDINGS: Tracheostomy tube in place. There is marked elevation of right hemidiaphragm, greater than on previous exams. There is gaseous distension of multiple bowel loops, increased over priors.  Heart appears shifted towards the left. There is atelectasis or crowding of lung markings bilaterally. Suspect volume loss or infiltrate of the left lower lobe.  IMPRESSION: 1. Significant elevation of the right hemidiaphragm. 2. Increased opacity in the left lung base consistent with atelectasis or infiltrate. 3. Increased gaseous distension of numerous bowel loops. 4. Recommend followup supine and erect views of the abdomen to evaluate bowel gas pattern.   Electronically Signed   By: Norva Pavlov M.D.   On: 08/01/2014 12:04     EKG Interpretation   Date/Time:  Friday August 01 2014 11:13:28 EDT Ventricular Rate:  96 PR Interval:  133 QRS Duration: 81 QT Interval:  340 QTC Calculation: 430 R Axis:   108 Text Interpretation:  Sinus rhythm Borderline right axis deviation Low  voltage, precordial leads Abnormal T, consider ischemia, lateral leads  Confirmed by Bebe Shaggy  MD, Dorinda Hill ([id number]) on 08/01/2014 11:25:18 AM      MDM   Final diagnoses:  Cellulitis of left foot  Transient alteration of awareness    Nursing notes including past medical history and social history reviewed and considered in documentation Labs/vital reviewed myself and considered during evaluation xrays/imaging reviewed by myself and considered during evaluation   I personally performed the services described in this documentation, which was scribed in my presence. The recorded information has been reviewed and is accurate.      Zadie Rhine, MD 08/01/14  1329

## 2014-08-01 NOTE — Telephone Encounter (Signed)
Talked with Sallye LatAlice Bowles and she stated that patient is having extreme abdominal pain, crying a lot, heart rate in the 80's, very lethargic, rash on vaginal area with discharge, secretions are cream colored and leg wound is worsening. Per Dr. Lorin PicketScott- advised [place] MoleAlice to take patient to the nearest emergency room ASAP for evaluation. Sallye Latlice Bowles verbalized understanding and agreed.

## 2014-08-01 NOTE — Telephone Encounter (Signed)
Notified Sallye LatAlice Bowles (home health nurse) Dr. Lorin PicketScott spoke with the ER doctor. They saw the patient today. They switched her antibiotics to clindamycin. May also reduce the Depakote down to 12 ml tid, please have home health check metabolic 7, CBC, Depakote level either Wednesday or Thursday of next week so that we will have results by Friday. Alice verbalized understanding.

## 2014-08-01 NOTE — Telephone Encounter (Signed)
Pts nurse is wanting to have some doxycycline called in for her leg wound And or anything you would recommend for her at this time. As well as the Following.    Rash, vaginal discharge, O2 sats dropping, secretions are think can't get  Them cough up. Very lethargic an seems to be in a great deal of pain.   [facility]

## 2014-08-01 NOTE — Telephone Encounter (Signed)
I spoke with the ER doctor. They saw the patient today. They switched her antibiotics to clindamycin. May also reduce the Depakote down to 12 ml tid, please have home health check metabolic 7, CBC, Depakote level either Wednesday or Thursday of next week so that we will have results by Friday

## 2014-08-01 NOTE — Telephone Encounter (Signed)
[facility] (need to get more information from nurse, possibly needs office visit today)

## 2014-08-03 LAB — URINE CULTURE: Culture: NO GROWTH

## 2014-08-08 DIAGNOSIS — E860 Dehydration: Secondary | ICD-10-CM | POA: Diagnosis not present

## 2014-08-08 DIAGNOSIS — G40909 Epilepsy, unspecified, not intractable, without status epilepticus: Secondary | ICD-10-CM | POA: Diagnosis not present

## 2014-08-11 DIAGNOSIS — G40901 Epilepsy, unspecified, not intractable, with status epilepticus: Secondary | ICD-10-CM | POA: Diagnosis not present

## 2014-08-11 DIAGNOSIS — F72 Severe intellectual disabilities: Secondary | ICD-10-CM | POA: Diagnosis not present

## 2014-08-11 DIAGNOSIS — K315 Obstruction of duodenum: Secondary | ICD-10-CM | POA: Diagnosis not present

## 2014-08-11 DIAGNOSIS — G809 Cerebral palsy, unspecified: Secondary | ICD-10-CM | POA: Diagnosis not present

## 2014-08-11 DIAGNOSIS — J9610 Chronic respiratory failure, unspecified whether with hypoxia or hypercapnia: Secondary | ICD-10-CM | POA: Diagnosis not present

## 2014-08-14 ENCOUNTER — Telehealth: Payer: Self-pay | Admitting: *Deleted

## 2014-08-14 NOTE — Telephone Encounter (Signed)
[facility] with home health nurse Selena Batten.  See bw in folder at nurse's station. Dr. Brett Canales wants to know why tegretol and valproic acid not done and if it was added on.

## 2014-08-15 ENCOUNTER — Other Ambulatory Visit: Payer: Self-pay | Admitting: *Deleted

## 2014-08-15 NOTE — Telephone Encounter (Signed)
bw reswults are in folder in your office.

## 2014-08-15 NOTE — Telephone Encounter (Signed)
Called  Home health  Nurse she states tegretol and valproic acid levels were drawn. She is not sure why we don't have results. Called [facility]. They have results and will fax over.

## 2014-08-20 ENCOUNTER — Other Ambulatory Visit: Payer: Self-pay

## 2014-08-20 MED ORDER — ALPRAZOLAM 0.5 MG PO TABS: ORAL_TABLET | ORAL | Status: DC

## 2014-08-20 NOTE — Telephone Encounter (Signed)
Last seen 05/20/14.

## 2014-08-20 NOTE — Telephone Encounter (Signed)
Ok six mo worth 

## 2014-08-25 NOTE — Telephone Encounter (Signed)
Card mailed to family stating bw was normal per Dr. Brett Canales. bw sent to be scanned

## 2014-09-09 ENCOUNTER — Telehealth: Payer: Self-pay | Admitting: *Deleted

## 2014-09-09 ENCOUNTER — Other Ambulatory Visit: Payer: Self-pay | Admitting: *Deleted

## 2014-09-09 MED ORDER — FLUCONAZOLE 150 MG PO TABS: ORAL_TABLET | ORAL | Status: AC

## 2014-09-09 NOTE — Telephone Encounter (Signed)
Home health nurse Ernest Pine calling requesting diflucan for [name]. Having vaginal discharge and redness. Diflucan 150 mg #2 one 3 days apart sent to [facility]. [name] notified.

## 2014-09-11 ENCOUNTER — Telehealth: Payer: Self-pay | Admitting: Family Medicine

## 2014-09-11 NOTE — Telephone Encounter (Signed)
Ok sounds reasonable

## 2014-09-11 NOTE — Telephone Encounter (Signed)
Home health nurse called on behalf of the patient and stated that the patient had a seizure at 6:15 am that lasted 4 minutes. Home health nurse stated that patient is alert but eyes are still fixed in an upper left position. Patient is able to move her head and respond. Please advise.

## 2014-09-11 NOTE — Telephone Encounter (Signed)
Spoke with patients mother and patient mother states that patient is back at baseline. She stated that patient has not had any seizures since 2011 but the neurologist that patient see's informed them that the patient will have some breakthrough seizures from time to time while on prescribed medications. Patient's mother stated that she does not feel at this time that the patient needs to go to the ER but will contact us again and take patient to ER if seizure occur again or patient worsens.

## 2014-09-11 NOTE — Telephone Encounter (Signed)
Unable to reach patient's mother at this time but left message on mothers cell phone and with home health nurse to have patient's mother return call. Home heath nurse stated that patient's eyes have relaxed to normal state and patient's pulse is elevated to around 101-103.

## 2014-09-11 NOTE — Telephone Encounter (Signed)
This family generally has very strong feelings about how they'd like to proceed. Talk with the mother, ask her how she thinks she is doing, and whether she is back to baseline etc. Do they think she needs to go tho the hospital?

## 2014-09-12 ENCOUNTER — Encounter: Payer: Self-pay | Admitting: Family Medicine

## 2014-10-10 DIAGNOSIS — F72 Severe intellectual disabilities: Secondary | ICD-10-CM | POA: Diagnosis not present

## 2014-10-10 DIAGNOSIS — J9610 Chronic respiratory failure, unspecified whether with hypoxia or hypercapnia: Secondary | ICD-10-CM | POA: Diagnosis not present

## 2014-10-10 DIAGNOSIS — G40901 Epilepsy, unspecified, not intractable, with status epilepticus: Secondary | ICD-10-CM | POA: Diagnosis not present

## 2014-10-10 DIAGNOSIS — G809 Cerebral palsy, unspecified: Secondary | ICD-10-CM | POA: Diagnosis not present

## 2014-10-10 DIAGNOSIS — K315 Obstruction of duodenum: Secondary | ICD-10-CM | POA: Diagnosis not present

## 2014-10-14 ENCOUNTER — Telehealth: Payer: Self-pay | Admitting: *Deleted

## 2014-10-14 MED ORDER — ZOLPIDEM TARTRATE 5 MG PO TABS: ORAL_TABLET | ORAL | Status: DC

## 2014-10-14 NOTE — Telephone Encounter (Signed)
Rx faxed to [facility].  

## 2014-10-14 NOTE — Addendum Note (Signed)
Addended by: Margaretha Sheffield on: 10/14/2014 01:15 PM   Modules accepted: Orders

## 2014-10-14 NOTE — Telephone Encounter (Signed)
Needs rx for ambien  sent to [facility]

## 2014-10-14 NOTE — Telephone Encounter (Signed)
Ok plus 5 ref 

## 2014-10-21 ENCOUNTER — Telehealth: Payer: Self-pay | Admitting: *Deleted

## 2014-10-21 DIAGNOSIS — K5900 Constipation, unspecified: Secondary | ICD-10-CM | POA: Diagnosis not present

## 2014-10-21 NOTE — Telephone Encounter (Signed)
[name] home health nurse calling to request order for KUB. [facility] has a portable machine they can bring out. Needs order faxed to [phone]. Home health nurse states she has history of lleus. Has not had bm since sept 28th. Takes miralax daily and has had 3 extra doses. 2 enemas. And one dose of prn sorbitol.  Had one stool the size of 2 walnuts. Bowl sounds sluggish. Please advise. [name]'s number [phone]

## 2014-10-21 NOTE — Telephone Encounter (Signed)
Consult with dr Brett Canales. Do 2 two enemas within the next 24 hours and order kub stat. Discussed with home health nurse kim greene and order faxed to do stat kub.

## 2014-10-22 ENCOUNTER — Telehealth: Payer: Self-pay | Admitting: Family Medicine

## 2014-10-22 NOTE — Telephone Encounter (Signed)
Discussed with kim home health nurse that xray showed bowel gas per dr Brett Canales. Selena Batten states pt doing better.  enema last night about 10 pm and had a decent outcome. Stool was liquid no form to it. No discomfort. She did another enema about 20 mins ago. Nurse to call back if any trouble.

## 2014-10-22 NOTE — Telephone Encounter (Signed)
Fax from [facility], she folder for results done on 10/4

## 2014-11-04 DIAGNOSIS — G40901 Epilepsy, unspecified, not intractable, with status epilepticus: Secondary | ICD-10-CM | POA: Diagnosis not present

## 2014-11-04 DIAGNOSIS — J9610 Chronic respiratory failure, unspecified whether with hypoxia or hypercapnia: Secondary | ICD-10-CM | POA: Diagnosis not present

## 2014-11-04 DIAGNOSIS — F72 Severe intellectual disabilities: Secondary | ICD-10-CM | POA: Diagnosis not present

## 2014-11-04 DIAGNOSIS — K315 Obstruction of duodenum: Secondary | ICD-10-CM | POA: Diagnosis not present

## 2014-11-04 DIAGNOSIS — G809 Cerebral palsy, unspecified: Secondary | ICD-10-CM | POA: Diagnosis not present

## 2014-11-18 ENCOUNTER — Telehealth: Payer: Self-pay | Admitting: *Deleted

## 2014-11-18 DIAGNOSIS — Z23 Encounter for immunization: Secondary | ICD-10-CM | POA: Diagnosis not present

## 2014-11-18 NOTE — Telephone Encounter (Signed)
Yes may order flu, b w done just mid July too early

## 2014-11-18 NOTE — Telephone Encounter (Signed)
Selena BattenKim - home health nurse calling to get rx for flu vaccine for [facility] to deliver so kim can administer. Also orders for bloodwork if any are due. Home health nurse states it is time for her 6 month bw.

## 2014-11-18 NOTE — Telephone Encounter (Signed)
Discussed with Selena BattenKim. rx faxed for flu vaccine to [facility] to deliver.

## 2014-11-20 ENCOUNTER — Other Ambulatory Visit: Payer: Self-pay | Admitting: Family Medicine

## 2014-11-20 ENCOUNTER — Encounter: Payer: Self-pay | Admitting: Family Medicine

## 2014-11-21 ENCOUNTER — Telehealth: Payer: Self-pay | Admitting: Family Medicine

## 2014-11-21 NOTE — Telephone Encounter (Signed)
Mom dropped off a form to be filled out for [employer]. Call mom when this form is finished she will pick it up. Message in box.

## 2014-12-01 ENCOUNTER — Telehealth: Payer: Self-pay | Admitting: *Deleted

## 2014-12-01 MED ORDER — CEFDINIR 125 MG/5ML PO SUSR: ORAL | Status: DC

## 2014-12-01 MED ORDER — MUPIROCIN 2 % EX OINT: TOPICAL_OINTMENT | CUTANEOUS | Status: AC

## 2014-12-01 NOTE — Telephone Encounter (Signed)
[facility]- pt has a wound on left inner ankle that they have been just washing with soap and water. Today area is red about the size of a 50 cent piece and warm to touch. Low grade fever last thur and and fri. Can something be called into [facility] to deliver.

## 2014-12-01 NOTE — Telephone Encounter (Signed)
omnicef susp 300 mg bid for ten d/Bactroban oint bid affected area

## 2014-12-01 NOTE — Telephone Encounter (Signed)
Spoke with home health nurse and informed her per Dr.Steve Luking- Omnicef sus 300 mg twice a day for 10 days and Bactroban ointment twice a day to affected area was sent into pharmacy. Home health nurse  Verbalized understanding.

## 2014-12-04 ENCOUNTER — Telehealth: Payer: Self-pay | Admitting: *Deleted

## 2014-12-04 MED ORDER — FLUCONAZOLE 150 MG PO TABS: ORAL_TABLET | ORAL | Status: AC

## 2014-12-04 NOTE — Telephone Encounter (Signed)
[name] [facility] calling to report pt has a yeast infection. Pt was put on antibiotics on the 14th. Diflucan 150mg  one po 3 days apart sent to [facility]. [name] notified.

## 2014-12-12 DIAGNOSIS — J9610 Chronic respiratory failure, unspecified whether with hypoxia or hypercapnia: Secondary | ICD-10-CM | POA: Diagnosis not present

## 2014-12-12 DIAGNOSIS — G40901 Epilepsy, unspecified, not intractable, with status epilepticus: Secondary | ICD-10-CM | POA: Diagnosis not present

## 2014-12-12 DIAGNOSIS — K315 Obstruction of duodenum: Secondary | ICD-10-CM | POA: Diagnosis not present

## 2014-12-12 DIAGNOSIS — F72 Severe intellectual disabilities: Secondary | ICD-10-CM | POA: Diagnosis not present

## 2014-12-12 DIAGNOSIS — G809 Cerebral palsy, unspecified: Secondary | ICD-10-CM | POA: Diagnosis not present

## 2014-12-25 ENCOUNTER — Other Ambulatory Visit: Payer: Self-pay | Admitting: Family Medicine

## 2015-02-04 ENCOUNTER — Other Ambulatory Visit: Payer: Self-pay | Admitting: Family Medicine

## 2015-02-04 NOTE — Telephone Encounter (Signed)
Ok one yrs worth 

## 2015-02-05 DIAGNOSIS — G40901 Epilepsy, unspecified, not intractable, with status epilepticus: Secondary | ICD-10-CM | POA: Diagnosis not present

## 2015-02-05 DIAGNOSIS — K315 Obstruction of duodenum: Secondary | ICD-10-CM | POA: Diagnosis not present

## 2015-02-05 DIAGNOSIS — F72 Severe intellectual disabilities: Secondary | ICD-10-CM | POA: Diagnosis not present

## 2015-02-05 DIAGNOSIS — J9610 Chronic respiratory failure, unspecified whether with hypoxia or hypercapnia: Secondary | ICD-10-CM | POA: Diagnosis not present

## 2015-02-05 DIAGNOSIS — G809 Cerebral palsy, unspecified: Secondary | ICD-10-CM | POA: Diagnosis not present

## 2015-02-13 ENCOUNTER — Telehealth: Payer: Self-pay | Admitting: Family Medicine

## 2015-02-13 NOTE — Telephone Encounter (Signed)
Rx prior auth APPROVED for pt's baclofen (LIORESAL) 20 MG tablet (compounded)  Valid 02/12/15-02/12/16 through [facility], faxed approval to [employer], notified pt's mom

## 2015-03-03 ENCOUNTER — Telehealth: Payer: Self-pay | Admitting: Family Medicine

## 2015-03-03 MED ORDER — CEFDINIR 250 MG/5ML PO SUSR: ORAL | Status: DC

## 2015-03-03 NOTE — Telephone Encounter (Signed)
If sick enoug to warrant "protable chest xray" probably should see an e r doc and get blood work etc. If not tha t bad yet, can try abx

## 2015-03-03 NOTE — Telephone Encounter (Signed)
omnicef three hundred susp bid ten d, hold off on xray for now

## 2015-03-03 NOTE — Telephone Encounter (Signed)
Spoke with patient's mother and informed her per Dr.Steve Luking- If sick enough to warrant "portable chest xray" patient should probably be seen by and ER doctor and get blood work etc. If not that bad yet can try Omnicef 250/5 take 300 mg twice a day for 10 days. Patient's mother verbalized understanding.

## 2015-03-03 NOTE — Telephone Encounter (Signed)
Yes 300 mg susp bid ten d

## 2015-03-03 NOTE — Telephone Encounter (Signed)
Clarification: Did you mean Omnicef 250/5. Please advise?

## 2015-03-03 NOTE — Telephone Encounter (Signed)
Mom was here for an appt and stated that the pt is having to be suctioned a lot and is on 2 liters of o2 for the last couple of days. Pt is also having trouble with her breathing. Mom states that they have been giving her breathing treatments. Mom states that the pt is very congested and sounds "moist" while breathing. Mom is wanting to know if she should have a mobile xray done on the pt. Please advise.

## 2015-03-04 ENCOUNTER — Telehealth: Payer: Self-pay | Admitting: Family Medicine

## 2015-03-04 MED ORDER — LEVOFLOXACIN 500 MG PO TABS: 500.0000 mg | ORAL_TABLET | Freq: Every day | ORAL | Status: AC

## 2015-03-04 NOTE — Telephone Encounter (Signed)
cefdinir (OMNICEF) 250 MG/5ML suspension  Pt went in to resp. Distress last year when issued this med  They would like a different med called in please   [facility]

## 2015-03-04 NOTE — Telephone Encounter (Signed)
Mother not sure which med would be good but allergic to amoxicillins and cefdnirs- resp distress

## 2015-03-04 NOTE — Telephone Encounter (Signed)
ntsw mother what would she like??

## 2015-03-04 NOTE — Telephone Encounter (Signed)
Updated allergy list. Rx sent electronically to pharmacy. Mother notified.

## 2015-03-04 NOTE — Telephone Encounter (Signed)
Document cefdinir  levaquin 500 qd for ten d, may crush and give via g tube or liq if avail

## 2015-03-11 ENCOUNTER — Other Ambulatory Visit: Payer: Self-pay | Admitting: Family Medicine

## 2015-03-26 ENCOUNTER — Other Ambulatory Visit: Payer: Self-pay | Admitting: Family Medicine

## 2015-03-30 ENCOUNTER — Other Ambulatory Visit: Payer: Self-pay | Admitting: Family Medicine

## 2015-03-31 DIAGNOSIS — G809 Cerebral palsy, unspecified: Secondary | ICD-10-CM | POA: Diagnosis not present

## 2015-03-31 DIAGNOSIS — J9610 Chronic respiratory failure, unspecified whether with hypoxia or hypercapnia: Secondary | ICD-10-CM | POA: Diagnosis not present

## 2015-03-31 DIAGNOSIS — G40901 Epilepsy, unspecified, not intractable, with status epilepticus: Secondary | ICD-10-CM | POA: Diagnosis not present

## 2015-03-31 DIAGNOSIS — K315 Obstruction of duodenum: Secondary | ICD-10-CM | POA: Diagnosis not present

## 2015-03-31 DIAGNOSIS — F72 Severe intellectual disabilities: Secondary | ICD-10-CM | POA: Diagnosis not present

## 2015-04-08 ENCOUNTER — Telehealth: Payer: Self-pay | Admitting: Family Medicine

## 2015-04-08 ENCOUNTER — Other Ambulatory Visit: Payer: Self-pay | Admitting: Family Medicine

## 2015-04-08 NOTE — Telephone Encounter (Signed)
Valproic acid carbemazepine cbc liv m7

## 2015-04-08 NOTE — Telephone Encounter (Signed)
[facility] 04/08/15 

## 2015-04-08 NOTE — Telephone Encounter (Signed)
Home health nurse Everrett Coombeammy Bray asking for verbal order to get labs done before physical 5/3. Her number is [phone].

## 2015-04-08 NOTE — Telephone Encounter (Signed)
Spoke with home health nurse and gave verbal orders per Dr.Steve Luking for the following labs- Valproic acid, carbemazepine,CBC , hepatic function panel, and BMET. Home health nurse verbalized understanding.

## 2015-05-11 ENCOUNTER — Other Ambulatory Visit: Payer: Self-pay | Admitting: Family Medicine

## 2015-05-12 ENCOUNTER — Other Ambulatory Visit: Payer: Self-pay | Admitting: Family Medicine

## 2015-05-12 DIAGNOSIS — G809 Cerebral palsy, unspecified: Secondary | ICD-10-CM | POA: Diagnosis not present

## 2015-05-12 DIAGNOSIS — G839 Paralytic syndrome, unspecified: Secondary | ICD-10-CM | POA: Diagnosis not present

## 2015-05-13 LAB — BASIC METABOLIC PANEL
BUN/Creatinine Ratio: 38 — ABNORMAL HIGH (ref 9–23)
BUN: 8 mg/dL (ref 6–20)
CALCIUM: 8.9 mg/dL (ref 8.7–10.2)
CHLORIDE: 90 mmol/L — AB (ref 96–106)
CO2: 24 mmol/L (ref 18–29)
Creatinine, Ser: 0.21 mg/dL — ABNORMAL LOW (ref 0.57–1.00)
GFR calc non Af Amer: 171 mL/min/{1.73_m2} (ref >59)
GFR, EST AFRICAN AMERICAN: 197 mL/min/{1.73_m2} (ref >59)
GLUCOSE: 98 mg/dL (ref 65–99)
Potassium: 4.6 mmol/L (ref 3.5–5.2)
Sodium: 135 mmol/L (ref 134–144)

## 2015-05-13 LAB — CBC WITH DIFFERENTIAL/PLATELET
BASOS: 0 %
Basophils Absolute: 0 10*3/uL (ref 0.0–0.2)
EOS (ABSOLUTE): 0.1 10*3/uL (ref 0.0–0.4)
Eos: 1 %
Hematocrit: 40.8 % (ref 34.0–46.6)
Hemoglobin: 12.7 g/dL (ref 11.1–15.9)
IMMATURE GRANS (ABS): 0 10*3/uL (ref 0.0–0.1)
IMMATURE GRANULOCYTES: 0 %
LYMPHS: 28 %
Lymphocytes Absolute: 1.8 10*3/uL (ref 0.7–3.1)
MCH: 25.1 pg — ABNORMAL LOW (ref 26.6–33.0)
MCHC: 31.1 g/dL — ABNORMAL LOW (ref 31.5–35.7)
MCV: 81 fL (ref 79–97)
MONOCYTES: 11 %
Monocytes Absolute: 0.7 10*3/uL (ref 0.1–0.9)
NEUTROS PCT: 60 %
Neutrophils Absolute: 3.7 10*3/uL (ref 1.4–7.0)
Platelets: 108 10*3/uL — ABNORMAL LOW (ref 150–379)
RBC: 5.05 x10E6/uL (ref 3.77–5.28)
RDW: 14.1 % (ref 12.3–15.4)
WBC: 6.3 10*3/uL (ref 3.4–10.8)

## 2015-05-13 LAB — HEPATIC FUNCTION PANEL
ALT: 9 IU/L (ref 0–32)
AST: 24 IU/L (ref 0–40)
Albumin: 4.1 g/dL (ref 3.5–5.5)
Alkaline Phosphatase: 59 IU/L (ref 39–117)
BILIRUBIN, DIRECT: 0.06 mg/dL (ref 0.00–0.40)
TOTAL PROTEIN: 6.7 g/dL (ref 6.0–8.5)

## 2015-05-14 LAB — SPECIMEN STATUS REPORT

## 2015-05-14 LAB — CARBAMAZEPINE LEVEL, TOTAL: Carbamazepine (Tegretol), S: 7.4 ug/mL (ref 4.0–12.0)

## 2015-05-14 LAB — VALPROIC ACID LEVEL: Valproic Acid Lvl: 76 ug/mL (ref 50–100)

## 2015-05-19 ENCOUNTER — Encounter: Payer: Self-pay | Admitting: Family Medicine

## 2015-05-19 ENCOUNTER — Other Ambulatory Visit: Payer: Self-pay | Admitting: Family Medicine

## 2015-05-19 NOTE — Telephone Encounter (Signed)
Ok six mo worth ok

## 2015-05-20 ENCOUNTER — Encounter: Payer: Medicare Other | Admitting: Family Medicine

## 2015-05-21 ENCOUNTER — Ambulatory Visit (INDEPENDENT_AMBULATORY_CARE_PROVIDER_SITE_OTHER): Payer: Medicare Other | Admitting: Family Medicine

## 2015-05-21 DIAGNOSIS — G800 Spastic quadriplegic cerebral palsy: Secondary | ICD-10-CM | POA: Diagnosis not present

## 2015-05-21 DIAGNOSIS — G40209 Localization-related (focal) (partial) symptomatic epilepsy and epileptic syndromes with complex partial seizures, not intractable, without status epilepticus: Secondary | ICD-10-CM | POA: Diagnosis not present

## 2015-05-21 DIAGNOSIS — K219 Gastro-esophageal reflux disease without esophagitis: Secondary | ICD-10-CM | POA: Diagnosis not present

## 2015-05-21 DIAGNOSIS — J9611 Chronic respiratory failure with hypoxia: Secondary | ICD-10-CM

## 2015-05-21 DIAGNOSIS — Z0189 Encounter for other specified special examinations: Secondary | ICD-10-CM

## 2015-05-21 DIAGNOSIS — Z0000 Encounter for general adult medical examination without abnormal findings: Secondary | ICD-10-CM

## 2015-05-21 NOTE — Progress Notes (Addendum)
   Subjective:    Patient ID: [name], female    DOB: 02/01/1985, 30 y.o.   MRN: [id number]  HPI Patient arrives for a physical. Patient's current weight 99 lbs per home health nurse.   Patient having problems with her face being broken out-getting ready to start cycle and seems to be worse them.   wound ck chronicaly done with skin leseion  Doing better when left open to air,  Been doing good per mom -had recent labs-results in chart. Results for orders placed or performed in visit on 05/12/15  Carbamazepine level, total  Result Value Ref Range   Carbamazepine (Tegretol), S 7.4 4.0 - 12.0 ug/mL  Valproic acid level  Result Value Ref Range   Valproic Acid Lvl 76 50 - 100 ug/mL  Specimen status report  Result Value Ref Range   specimen status report Comment     No recent major hosp   No recent seizures. Compliant with seizure medication.  Please see telephone messages. There had been 23 telephone calls from the family since our last face-to-face interaction, please see for further details     Review of Systems Chronic seizures, chronic acne, chronic feeding tube with some irritation from this. Chronic challenges with heel eschar and/or blistering does stable now, intermittent respiratory infections treated with antibiotics complete ROS otherwise negative    Objective:   Physical Exam  Alert vitals stable hydration good. Baseline alertness. Clear dysmorphic features from cerebral palsy. Hyper is spasticity of extremities. Lesion right heel well healed. Lungs upper respiratory congestion. No true Crackles no wheezes no tachypnea heart regular rate and rhythm trach site appears good abdomen G-tube site appears good no palpable organomegaly, skin slight acne changes face      Assessment & Plan:  Impression wellness exam up to date on vaccinations #2 seizure disorder clinically stable discussed #3 skin concerns discussed #4 respiratory status clinically stable when  necessary albuterol treatments along with occasional antibiotic see prior note #4 reflux clinically stable #5 cerebral palsy with concrete commitment hyperspasticity stable with current muscle spasm agents plan weight stable maintain feeding tube same. Maintain other meds blood work reviewed patient to continue to work closely with home health [facility]  Please note the patient has chronic lung disease with chronic respiratory failure associated with chronic hypoxia. 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 This is due to her long-standing cerebral palsy and chronic recurrent pneumonia along with hypoventilation. She Needs Oxygen in Order to Survive. Oxygen saturation without supplemental O2 is low. Please see exact numbers as obtained through patient's nurse.  Further addendum patient's oxygen saturation on room air is 86%.

## 2015-05-25 DIAGNOSIS — M79609 Pain in unspecified limb: Secondary | ICD-10-CM | POA: Diagnosis not present

## 2015-05-26 ENCOUNTER — Telehealth: Payer: Self-pay | Admitting: Family Medicine

## 2015-05-26 NOTE — Telephone Encounter (Signed)
Calling to get results to xray for finger.

## 2015-05-26 NOTE — Telephone Encounter (Signed)
Results discussed with home health nurse-advised finger xray negative-follow up if ongoing-verbalized understanding.

## 2015-05-26 NOTE — Telephone Encounter (Signed)
Xray states no clearly displaced fracture of finger

## 2015-05-26 NOTE — Telephone Encounter (Signed)
Sounds negative follow-up if ongoing troubles

## 2015-05-28 DIAGNOSIS — G809 Cerebral palsy, unspecified: Secondary | ICD-10-CM | POA: Diagnosis not present

## 2015-05-28 DIAGNOSIS — G40901 Epilepsy, unspecified, not intractable, with status epilepticus: Secondary | ICD-10-CM | POA: Diagnosis not present

## 2015-05-28 DIAGNOSIS — K315 Obstruction of duodenum: Secondary | ICD-10-CM | POA: Diagnosis not present

## 2015-05-28 DIAGNOSIS — F72 Severe intellectual disabilities: Secondary | ICD-10-CM | POA: Diagnosis not present

## 2015-06-03 ENCOUNTER — Encounter: Payer: Self-pay | Admitting: Family Medicine

## 2015-06-08 ENCOUNTER — Telehealth: Payer: Self-pay | Admitting: Family Medicine

## 2015-06-08 NOTE — Telephone Encounter (Signed)
ok 

## 2015-06-08 NOTE — Telephone Encounter (Signed)
Pt's mother called stating that the pt spiked a fever over the weekend and at the moment they are alternating ibuprofen and tylenol. Mom states that they are also doing nebs every 4 hours and that her breathing has stablized and that her fever is currently 99.5. Mom states they are going to continue this unless her fever spikes again. Mom is wanting to avoid jumping the gun.

## 2015-06-10 ENCOUNTER — Other Ambulatory Visit: Payer: Self-pay | Admitting: Family Medicine

## 2015-06-16 ENCOUNTER — Telehealth: Payer: Self-pay | Admitting: Family Medicine

## 2015-06-16 ENCOUNTER — Other Ambulatory Visit: Payer: Self-pay

## 2015-06-16 ENCOUNTER — Other Ambulatory Visit: Payer: Self-pay | Admitting: Family Medicine

## 2015-06-16 MED ORDER — VALPROATE SODIUM 250 MG/5ML PO SOLN: ORAL | Status: DC

## 2015-06-16 NOTE — Telephone Encounter (Signed)
[facility] says they did not receive the Valproate Sodium (DEPAKENE) 250 MG/5ML SOLN solution that was sent over on 06/10/15.  Can we resend this?

## 2015-06-16 NOTE — Telephone Encounter (Signed)
Medication resent verified with pharmacy that they received medication.

## 2015-07-09 ENCOUNTER — Other Ambulatory Visit: Payer: Self-pay | Admitting: Family Medicine

## 2015-07-22 ENCOUNTER — Other Ambulatory Visit: Payer: Self-pay | Admitting: Family Medicine

## 2015-07-31 ENCOUNTER — Encounter: Payer: Self-pay | Admitting: *Deleted

## 2015-08-06 ENCOUNTER — Other Ambulatory Visit: Payer: Self-pay | Admitting: Family Medicine

## 2015-08-06 NOTE — Telephone Encounter (Signed)
May have 6 refills 

## 2015-08-07 ENCOUNTER — Other Ambulatory Visit: Payer: Self-pay | Admitting: Family Medicine

## 2015-08-07 NOTE — Telephone Encounter (Signed)
May refill 6 

## 2015-08-10 ENCOUNTER — Telehealth: Payer: Self-pay | Admitting: Family Medicine

## 2015-08-10 NOTE — Telephone Encounter (Signed)
Spoke with Home health nurse Tammy and informed her that refills for the medication was sent into pharmacy on 08/06/15. Verified with pharmacy that the prescription was received. Home Health Nurse verbalized understanding.

## 2015-08-10 NOTE — Telephone Encounter (Signed)
Patient needs Rx for glycopyrrolate (ROBINUL) 1 MG tablet.  The pharmacy states that they have not received the medication that was sent last week.     [facility]

## 2015-08-10 NOTE — Telephone Encounter (Signed)
[facility] 08/10/15 

## 2015-08-11 ENCOUNTER — Telehealth: Payer: Self-pay | Admitting: Family Medicine

## 2015-08-11 DIAGNOSIS — G40901 Epilepsy, unspecified, not intractable, with status epilepticus: Secondary | ICD-10-CM | POA: Diagnosis not present

## 2015-08-11 DIAGNOSIS — K315 Obstruction of duodenum: Secondary | ICD-10-CM | POA: Diagnosis not present

## 2015-08-11 DIAGNOSIS — J9610 Chronic respiratory failure, unspecified whether with hypoxia or hypercapnia: Secondary | ICD-10-CM | POA: Diagnosis not present

## 2015-08-11 DIAGNOSIS — G809 Cerebral palsy, unspecified: Secondary | ICD-10-CM | POA: Diagnosis not present

## 2015-08-11 DIAGNOSIS — F72 Severe intellectual disabilities: Secondary | ICD-10-CM | POA: Diagnosis not present

## 2015-08-11 NOTE — Telephone Encounter (Signed)
Spoke with Lolly Mustache at [facility] to discuss the renewal paperwork that we received via fax for patient's oxygen. Destiny stated that we need the recent office note to mention the diagnoses for Acute-on-Chronic respiratory failure diagnoses and how her oxygen helps with this diagnoses. Also we need to include a Oxygen saturation that we got on patient. Last oxygen saturation we done on patient in paper chart was 03/10/2010 the oxygen saturation was 94% on Oxygen O, and 93% on Room Air. There are no encounters in epic with Oxygen saturation recorded.Most recent oxygen in epic was done by the ER at 08/01/2014 with a reading of 96% (unspecified in if room air or on oxygen). Patient's oxygen renewal is needed before August so patient can continue oxygen therapy.

## 2015-08-11 NOTE — Telephone Encounter (Signed)
All right, call and spk to [facility] can we not order an oxygen sat to be done at the home, sine we rely on the nursew there to do much more important interventions. If they say no, i'd like to see that in writing beause that is ridiculous

## 2015-08-12 NOTE — Telephone Encounter (Signed)
Spoke with Lolly Mustache with [facility] and was told that we can have a home health nurse do the oxygen saturation at rest on room air. The patient's Oxygen saturation however has to be 88% or less to have oxygen therapy renewed.   Lolly Mustache [phone]

## 2015-08-17 NOTE — Telephone Encounter (Signed)
Hi autumn, forms back to you, fill out everything and I'll sign

## 2015-08-17 NOTE — Telephone Encounter (Signed)
Call h h nurse and lets order this, also let nurse know level we need in order fro pt to qualify (She needs it!!!!)

## 2015-08-17 NOTE — Telephone Encounter (Signed)
Forms completed and given to medical records to be faxed with patient's medical records.

## 2015-08-17 NOTE — Telephone Encounter (Signed)
[name] [facility] nurse checked he o2 sat today on room air and the reading is 86%

## 2015-08-21 DIAGNOSIS — J9610 Chronic respiratory failure, unspecified whether with hypoxia or hypercapnia: Secondary | ICD-10-CM | POA: Insufficient documentation

## 2015-09-04 ENCOUNTER — Telehealth: Payer: Self-pay | Admitting: Family Medicine

## 2015-09-04 NOTE — Telephone Encounter (Signed)
[facility] needs addendum to recent notes that were sent in July.They have to have oxygen uses in them and the diagnosis code (J96.20)the oxygen must be listed as a medication in the patient chart. They are trying to get this approved so she can continue to get her oxygen, [name]

## 2015-09-04 NOTE — Telephone Encounter (Signed)
Oxygen is already on the med list. I don't see any recent oxygen test results.

## 2015-09-04 NOTE — Telephone Encounter (Signed)
Can the nurses add oxygen to the med list??  Where are the recent oxygen test results??  I think it is ridiculous that they require an oxygen level dict in a note for a visit done BEFORE the oxygen test was performed, bet they are wrong on that but tired of getting hassled by them

## 2015-09-07 ENCOUNTER — Telehealth: Payer: Self-pay | Admitting: Family Medicine

## 2015-09-07 MED ORDER — LEVOFLOXACIN 500 MG PO TABS: ORAL_TABLET | ORAL | 0 refills | Status: AC

## 2015-09-07 NOTE — Telephone Encounter (Signed)
Mom states patient has been running a fever last two days of 98.2 ,99.5 and heart rate has been 117 to 120 last two days and respiration 20 ,blood pressure 98/60 .Was given ned treatment every two hours.Last antibiotic given was levofloxacin 500 mg and it seem to help and wants it called again.

## 2015-09-07 NOTE — Telephone Encounter (Signed)
Spoke with patient's mother and informed her per Dr.Steve Luking- We are sending in another round of Levaquin. Patient's mother verbalized understanding.

## 2015-09-07 NOTE — Telephone Encounter (Signed)
Repeat levaquin

## 2015-09-08 ENCOUNTER — Other Ambulatory Visit: Payer: Self-pay | Admitting: Family Medicine

## 2015-10-01 DIAGNOSIS — K315 Obstruction of duodenum: Secondary | ICD-10-CM | POA: Diagnosis not present

## 2015-10-01 DIAGNOSIS — J9610 Chronic respiratory failure, unspecified whether with hypoxia or hypercapnia: Secondary | ICD-10-CM | POA: Diagnosis not present

## 2015-10-01 DIAGNOSIS — F72 Severe intellectual disabilities: Secondary | ICD-10-CM | POA: Diagnosis not present

## 2015-10-01 DIAGNOSIS — G809 Cerebral palsy, unspecified: Secondary | ICD-10-CM | POA: Diagnosis not present

## 2015-10-01 DIAGNOSIS — G40901 Epilepsy, unspecified, not intractable, with status epilepticus: Secondary | ICD-10-CM | POA: Diagnosis not present

## 2015-10-02 ENCOUNTER — Telehealth: Payer: Self-pay | Admitting: Family Medicine

## 2015-10-02 NOTE — Telephone Encounter (Signed)
ok 

## 2015-10-02 NOTE — Telephone Encounter (Signed)
[name], staff nurse @ [facility] called.  She bumped her left great toe and there is a bruise over the knuckle, mom wants to wait it out and see what happens with it.  They are going to watch it over the weekend and if any changes, they will call back on Monday.  She just wanted to let Dr. Brett CanalesSteve know.

## 2015-11-03 ENCOUNTER — Other Ambulatory Visit: Payer: Self-pay | Admitting: Family Medicine

## 2015-11-04 NOTE — Telephone Encounter (Signed)
Six mo worth, round the clock rn's on site

## 2015-11-18 ENCOUNTER — Telehealth: Payer: Self-pay | Admitting: Family Medicine

## 2015-11-18 NOTE — Telephone Encounter (Signed)
ok 

## 2015-11-18 NOTE — Telephone Encounter (Signed)
Requesting order for flu shot so that Reatha's mom can come pick this up from us this afternoon.

## 2015-11-18 NOTE — Telephone Encounter (Signed)
Spoke with patient's Nurse and informed her per Dr.Steve Luking order for flu shot is ready for pick up. Patient's nurse verbalized understanding.

## 2015-12-03 ENCOUNTER — Other Ambulatory Visit: Payer: Self-pay | Admitting: Family Medicine

## 2015-12-07 DIAGNOSIS — G809 Cerebral palsy, unspecified: Secondary | ICD-10-CM | POA: Diagnosis not present

## 2015-12-07 DIAGNOSIS — G40901 Epilepsy, unspecified, not intractable, with status epilepticus: Secondary | ICD-10-CM | POA: Diagnosis not present

## 2015-12-07 DIAGNOSIS — F72 Severe intellectual disabilities: Secondary | ICD-10-CM | POA: Diagnosis not present

## 2015-12-07 DIAGNOSIS — K315 Obstruction of duodenum: Secondary | ICD-10-CM | POA: Diagnosis not present

## 2015-12-07 DIAGNOSIS — J9610 Chronic respiratory failure, unspecified whether with hypoxia or hypercapnia: Secondary | ICD-10-CM | POA: Diagnosis not present

## 2015-12-11 ENCOUNTER — Telehealth: Payer: Self-pay | Admitting: Family Medicine

## 2015-12-11 MED ORDER — SULFACETAMIDE SODIUM 10 % OP SOLN: OPHTHALMIC | 0 refills | Status: AC

## 2015-12-11 NOTE — Telephone Encounter (Signed)
Patient has been having drainage in the right corner of her right eye.  Her lids are puffy and a little pink.  They have tried compresses and Visine drops with no relief.  Wanting to know if Dr. Brett CanalesSteve can call something in?  [facility]

## 2015-12-11 NOTE — Telephone Encounter (Signed)
Donna states that patient's eye had matting, greenLupita Leash discharge, redness and puffiness. Sulfacetamide drops sent to [facility] per protocol. Patient not allergic to sulfur per Lupita Leashonna (nurse).

## 2015-12-30 ENCOUNTER — Telehealth: Payer: Self-pay | Admitting: Nurse Practitioner

## 2015-12-30 NOTE — Telephone Encounter (Signed)
Tammy, nurse for [facility], is requesting Eber Jonesarolyn or the nurse to call her back to discuss appointment that patient has tomorrow to discuss pimple like bump in area of right side of labia.  She has had this for a couple of weeks, but Tammy would like to find out if there are any other options besides bringing her in to hopefully avoid bringing her out in the cold and around sickness.

## 2015-12-30 NOTE — Telephone Encounter (Signed)
Discussed with Tammy. Advised her that it is hard to diagnose and treat without actually examining the area. Tammy states that she understands and she had her case manager (a Designer, jewelleryregistered nurse) to take a look at the area and it is not draining, painful or red at all. They decided to keep an eye on the area for now and if anything changes with it, they will bring her in. Tammy states the patient is so immunocompromised they don't want to take the risk of exposing her to illnesses in the office. Front desk notified to cancel appointment for tomorrow.

## 2015-12-31 ENCOUNTER — Ambulatory Visit: Payer: Medicare Other | Admitting: Nurse Practitioner

## 2016-01-04 ENCOUNTER — Telehealth: Payer: Self-pay | Admitting: Family Medicine

## 2016-01-04 NOTE — Telephone Encounter (Signed)
Mom dropped off forms to be filled out for [employer]. Forms will be at nurse station.

## 2016-01-19 ENCOUNTER — Other Ambulatory Visit: Payer: Self-pay | Admitting: Family Medicine

## 2016-01-21 ENCOUNTER — Telehealth: Payer: Self-pay | Admitting: Family Medicine

## 2016-01-21 NOTE — Telephone Encounter (Signed)
Pt needs refill on ketoconazole (NIZORAL) 2 % cream  Rash on her face a couple weeks & no better   [facility]   Please call when done

## 2016-01-25 ENCOUNTER — Other Ambulatory Visit: Payer: Self-pay | Admitting: Family Medicine

## 2016-02-01 ENCOUNTER — Other Ambulatory Visit: Payer: Self-pay | Admitting: Family Medicine

## 2016-02-01 DIAGNOSIS — K315 Obstruction of duodenum: Secondary | ICD-10-CM | POA: Diagnosis not present

## 2016-02-01 DIAGNOSIS — G809 Cerebral palsy, unspecified: Secondary | ICD-10-CM | POA: Diagnosis not present

## 2016-02-01 DIAGNOSIS — F72 Severe intellectual disabilities: Secondary | ICD-10-CM | POA: Diagnosis not present

## 2016-02-01 DIAGNOSIS — J9610 Chronic respiratory failure, unspecified whether with hypoxia or hypercapnia: Secondary | ICD-10-CM | POA: Diagnosis not present

## 2016-02-01 NOTE — Telephone Encounter (Signed)
Patient not seen since 05/17. Please advise

## 2016-02-06 DIAGNOSIS — R404 Transient alteration of awareness: Secondary | ICD-10-CM | POA: Diagnosis not present

## 2016-02-08 ENCOUNTER — Telehealth: Payer: Self-pay | Admitting: *Deleted

## 2016-02-08 NOTE — Telephone Encounter (Signed)
Certificate of death needs to be filled out. Form in your office.

## 2016-02-08 NOTE — Telephone Encounter (Signed)
I spoke with pts mother, cert completed

## 2016-02-18 DIAGNOSIS — 419620001 Death: Secondary | SNOMED CT | POA: Diagnosis not present

## 2016-02-18 DEATH — deceased
